# Patient Record
Sex: Female | Born: 1965 | Race: White | Hispanic: No | Marital: Single | Smoking: Current every day smoker
Health system: Southern US, Community
[De-identification: ages and names within clinical notes are randomized; demographics above are authoritative.]

## PROBLEM LIST (undated history)

## (undated) DIAGNOSIS — J449 Chronic obstructive pulmonary disease, unspecified: Secondary | ICD-10-CM

## (undated) DIAGNOSIS — I10 Essential (primary) hypertension: Secondary | ICD-10-CM

## (undated) DIAGNOSIS — I639 Cerebral infarction, unspecified: Secondary | ICD-10-CM

## (undated) DIAGNOSIS — E119 Type 2 diabetes mellitus without complications: Secondary | ICD-10-CM

## (undated) DIAGNOSIS — I2510 Atherosclerotic heart disease of native coronary artery without angina pectoris: Secondary | ICD-10-CM

## (undated) HISTORY — DX: Type 2 diabetes mellitus without complications: E11.9

## (undated) HISTORY — DX: Atherosclerotic heart disease of native coronary artery without angina pectoris: I25.10

## (undated) HISTORY — PX: TUBAL LIGATION: SHX77

---

## 1997-08-09 ENCOUNTER — Encounter: Admission: RE | Admit: 1997-08-09 | Discharge: 1997-11-07 | Payer: Self-pay | Admitting: Family Medicine

## 2000-10-06 ENCOUNTER — Emergency Department (HOSPITAL_COMMUNITY): Admission: EM | Admit: 2000-10-06 | Discharge: 2000-10-06 | Payer: Self-pay | Admitting: *Deleted

## 2001-04-07 ENCOUNTER — Emergency Department (HOSPITAL_COMMUNITY): Admission: EM | Admit: 2001-04-07 | Discharge: 2001-04-07 | Payer: Self-pay | Admitting: Emergency Medicine

## 2002-02-03 ENCOUNTER — Other Ambulatory Visit: Admission: RE | Admit: 2002-02-03 | Discharge: 2002-02-03 | Payer: Self-pay

## 2003-03-02 ENCOUNTER — Other Ambulatory Visit: Admission: RE | Admit: 2003-03-02 | Discharge: 2003-03-02 | Payer: Self-pay

## 2006-04-30 ENCOUNTER — Ambulatory Visit: Payer: Self-pay | Admitting: Family Medicine

## 2006-05-02 ENCOUNTER — Ambulatory Visit: Payer: Self-pay | Admitting: *Deleted

## 2006-05-03 ENCOUNTER — Inpatient Hospital Stay (HOSPITAL_COMMUNITY): Admission: EM | Admit: 2006-05-03 | Discharge: 2006-05-04 | Payer: Self-pay | Admitting: Emergency Medicine

## 2006-05-07 ENCOUNTER — Ambulatory Visit: Payer: Self-pay | Admitting: Family Medicine

## 2006-05-16 ENCOUNTER — Ambulatory Visit: Payer: Self-pay

## 2006-05-20 ENCOUNTER — Ambulatory Visit: Payer: Self-pay | Admitting: Cardiology

## 2006-06-12 ENCOUNTER — Ambulatory Visit: Payer: Self-pay | Admitting: Family Medicine

## 2007-05-15 ENCOUNTER — Ambulatory Visit: Payer: Self-pay | Admitting: Cardiology

## 2009-01-17 ENCOUNTER — Encounter: Payer: Self-pay | Admitting: Cardiology

## 2009-01-18 ENCOUNTER — Telehealth: Payer: Self-pay | Admitting: Cardiology

## 2009-01-18 DIAGNOSIS — I2510 Atherosclerotic heart disease of native coronary artery without angina pectoris: Secondary | ICD-10-CM

## 2009-01-25 ENCOUNTER — Telehealth (INDEPENDENT_AMBULATORY_CARE_PROVIDER_SITE_OTHER): Payer: Self-pay

## 2009-01-26 ENCOUNTER — Ambulatory Visit: Payer: Self-pay

## 2009-01-26 ENCOUNTER — Encounter (HOSPITAL_COMMUNITY): Admission: RE | Admit: 2009-01-26 | Discharge: 2009-02-24 | Payer: Self-pay | Admitting: Cardiology

## 2009-01-26 ENCOUNTER — Ambulatory Visit: Payer: Self-pay | Admitting: Cardiology

## 2010-07-11 NOTE — Assessment & Plan Note (Signed)
[facility]                            CARDIOLOGY OFFICE NOTE   Madison Gentry, Madison Gentry                         MRN:          [id number]  DATE:05/15/2007                            DOB:          1965-09-20    Madison Gentry is seen for cardiology follow-up.  She has known coronary  disease.  I saw her last in March of 2008.  Historically, there is a  history of hypertension.  She is not having any recurrent chest pain.   PAST MEDICAL HISTORY:  ALLERGIES:  NO KNOWN DRUG ALLERGIES.   MEDICATIONS:  Propranolol, hydrochlorothiazide, Lorazepam, Lipitor 40,  amlodipine 5 (dose to be increased to 10), Prozac 20 b.i.d., Ambien 10  and  Goody Powder daily.  We will recheck to be sure that she is getting  aspirin.   OTHER MEDICAL PROBLEMS:  See the list below.   REVIEW OF SYSTEMS:  She is actually feeling well, and her review of  systems is negative.   PHYSICAL EXAM:  VITAL SIGNS:  Weight is 232 pound.  She definitely needs  to lose weight.  Blood pressure is 130/87 with a pulse of 69.  GENERAL:  The patient is oriented to person, time and place.  Affect is  normal.  HEENT:  Reveals no xanthelasma.  She has normal extraocular motion.  There are no carotid bruits.  There is no jugular venous distention.  LUNGS:  Clear.  Respiratory effort is not labored.  CARDIAC:  Exam reveals an S1 with S2.  There are no clicks or  significant murmurs.  ABDOMEN:  The abdomen is soft.  She has no masses or bruits.   PROBLEMS:  Include:  1. History of hypertension.  I am going to push her amlodipine up to      10 mg daily.  2. History of some type of CVA in her 27s.  3. Depression.  4. History of cigarette use that was markedly reduced.  5. Headaches with very small amount of nitrates.  She is tolerating      amlodipine.  6. Coronary disease.  She has significant disease.  Medical therapy is      being followed.  I will see her back in one year.  I will consider      a Myoview  scan at that time.  She definitely needs a fasting lipid      profile.     Luis Abed, MD, [facility]  Electronically Signed    JDK/MedQ  DD: 05/15/2007  DT: 05/15/2007  Job #: [id number]   cc:   Delaney Meigs, M.D.

## 2010-07-14 NOTE — Cardiovascular Report (Signed)
NAME:  Madison Gentry, Madison Gentry NO.:  [id number]   MEDICAL RECORD NO.:  [id number]          PATIENT TYPE:  INP   LOCATION:  4705                         FACILITY:  [facility]   PHYSICIAN:  Arturo Morton. Riley Kill, MD, FACCDATE OF BIRTH:  09/11/1965   DATE OF PROCEDURE:  05/03/2006  DATE OF DISCHARGE:                            CARDIAC CATHETERIZATION   INDICATIONS:  The patient is a 45 year old who presents with 4 days of  intermittent chest discomfort.  Enzymes were negative.  No definite  electrocardiographic abnormalities are identified.  The current study  was done to assess coronary anatomy.   PROCEDURES:  1. Left heart catheterization.  2. Selective coronary arteriography.  3. Selective left ventriculography.   DESCRIPTION OF THE PROCEDURE:  The patient was brought to the  Catheterization Laboratory and prepped and draped in the usual fashion.  Through an anterior puncture, the femoral artery is entered.  Initially,  a 5-French sheath was placed.  Views of the left and right coronary  artery was then obtained in multiple angiographic projections.  It was  noted that the patient had a moderate diagonal stenosis but it was  difficult to lay out.  We therefore upgraded the sheath to a 6-French  sheath, and we used a JL3.0 guiding catheter to intubate the left main.  Multiple views of the left coronary artery were then obtained.  I then  reviewed the films with Dr. Juanda Chance.  The patient was then taken off the  table and into the holding area in satisfactory clinical condition.  She  tolerated the procedure well.  I then reviewed the studies with the  patient's two daughters, and her mother.   HEMODYNAMIC DATA:  1. Central aortic pressure 115/76.  2. Left ventricular pressure 110/14.  3. No gradient or pullback across the aortic valve.   ANGIOGRAPHIC DATA:  1. The right coronary artery is a nondominant vessel.  There is no      high-grade obstructive disease.  2. The left  main is fairly short and free of critical disease.  3. The left anterior descending artery is a fairly large-caliber      vessel that courses to the apex.  In the mid portion of the left      anterior descending artery is an area that clearly demonstrates      systolic compression suggestive of a myocardial bridging.  The      contour of the vessel was as well suggestive of that.  The diagonal      is a moderate-sized branch that demonstrates ostial disease of      about 80%.  The angle of takeoff from the native LAD is fairly      narrow, being less than 60 degrees in its takeoff.  4. The circumflex is a dominant vessel providing a first marginal      branch, there is a second marginal and third and fourth marginals      which are fairly small in caliber.  The distal vessel courses      posteriorly and provides a posterior descending branch derived  from      the circumflex system.  It is free of critical disease.  5. The ventriculogram demonstrates vigorous systolic function.   CONCLUSIONS:  1. Preserved overall left ventricular systolic function.  2. 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  3. High-grade stenosis in a moderately large diagonal branch with a      short discrete lesion at the ostium with angle of incidence of less      than 60 degrees.   DISPOSITION:  The lesion would be potentially approachable with a  cutting balloon.  However, the angle is fairly narrow, and the risk of  compromise of the left anterior descending artery is real with  percutaneous intervention.  The patient has not had a trial of medical  therapy.  Attempts to discontinue smoking, aspirin, and beta blockade as  well as nitrates should be considered.  The patient is not a good  candidate for dual antiplatelet therapy due to heavy menstrual cycles.  Outpatient stress testing would be warranted if we can get the symptoms  to resolve.      Arturo Morton.  Riley Kill, MD, [facility]  Electronically Signed     TDS/MEDQ  D:  05/03/2006  T:  05/04/2006  Job:  [id number]   cc:   Jonelle Sidle, MD  Cecil Cranker, MD, [facility]

## 2010-07-14 NOTE — H&P (Signed)
NAME:  Gentry, Madison NO.:  [id number]   MEDICAL RECORD NO.:  [id number]          PATIENT TYPE:  INP   LOCATION:  1827                         FACILITY:  [facility]   PHYSICIAN:  Rod Holler, MD     DATE OF BIRTH:  [birth year]   DATE OF ADMISSION:  05/02/2006  DATE OF DISCHARGE:                              HISTORY & PHYSICAL   PRIMARY CARE PHYSICIAN:  [facility] Department.   CHIEF COMPLAINT:  Chest pain.   HISTORY OF PRESENT ILLNESS:  Madison Gentry is a 45 year old female with a  history of hypertension, history of a stroke at a young age, tobacco  use, who presented to the emergency department with the complaints of  chest pain.  The patient states that over the past couple of days she  has had intermittent chest discomfort that lasts up to 10 minutes at a  time, has occurred up to four to five times per day.  The pain is not  necessarily associated with activity.  There is some associated  shortness of breath, no associated nausea or diaphoresis.  Prior to a  couple of days ago, she has never had this chest discomfort before.  She  has had no syncope or presyncope, no palpitations, no PND or orthopnea,  no lower extremity swelling.  The last episode occurred a couple of  hours ago.  In the emergency department, the patient was given aspirin.  Currently, she is chest pain free.   PAST MEDICAL HISTORY:  1. Hypertension.  2. History of a stroke in her 1s.   MEDICATIONS:  1. Propranolol/HCTZ 40/25 one tab p.o. daily.  2. Prozac 20 mg p.o. daily.   ALLERGIES:  No known drug allergies.   SOCIAL HISTORY:  The patient smokes two packs per day, works on a farm  and occasionally cleans houses.   FAMILY HISTORY:  Her father died of an MI in his 67s.   REVIEW OF SYSTEMS:  All systems were reviewed in detail and are negative  except as noted in the History of Present Illness.   PHYSICAL EXAMINATION:  VITAL SIGNS:  Blood pressure 145/93, heart rate  74,  temperature 98.5, respiratory rate 18.  GENERAL:  An obese female, alert and oriented x3, in no apparent  distress.  HEENT:  Atraumatic and normocephalic, pupils equal, round, and reactive  to light, extraocular movements intact, oropharynx clear.  NECK:  Supple, no adenopathy, no JVD, no carotid bruits.  CHEST:  Lungs clear to auscultation bilaterally.  CORONARY:  Regular rhythm, normal rate, normal S1 and S2, 2+ peripheral  pulses.  ABDOMEN:  Soft, nontender, and nondistended, active bowel sounds.  EXTREMITIES:  No clubbing, cyanosis, or edema.  NEUROLOGIC:  No focal deficits.   EKG shows a normal sinus rhythm, normal intervals, otherwise normal.   LABORATORY DATA:  White blood cell count 12.9, hematocrit 41.4, platelet  count 415, sodium 136, potassium 3.6, chloride 102, bicarb 28, BUN 9,  creatinine 0.6, glucose 102, total bilirubin is 0.3, alk-phos 63, SGOT  21, SGPT 24, total protein 6.8, albumin 3.6, calcium 9.5,  D-dimer 0.23,  a CK-MB of less than 1, less than 1, troponin less than 0.05, less than  0.05, myoglobin 29, 41.   IMPRESSION:  Chest pain.   PLAN:  1. Admit the patient to telemetry, rule out with serial cardiac      enzymes, aspirin daily, Statin daily, check a lipid panel,      Lopressor b.i.d., ACE inhibitor daily, no anticoagulation at this      time.  Anticipate a noninvasive workup given the present      information.  2. NPO.  3. Chest x-ray if not done in the emergency department.  4. Continue home dose of Prozac.      Rod Holler, MD  Electronically Signed     TRK/MEDQ  D:  05/02/2006  T:  05/03/2006  Job:  [id number]

## 2010-07-14 NOTE — Discharge Summary (Signed)
NAME:  Madison Gentry, [name] NO.:  [id number]   MEDICAL RECORD NO.:  [id number]          PATIENT TYPE:  INP   LOCATION:  4705                         FACILITY:  [facility]   PHYSICIAN:  Doylene Canning. Ladona Ridgel, MD    DATE OF BIRTH:  1965/05/18   DATE OF ADMISSION:  05/02/2006  DATE OF DISCHARGE:  05/04/2006                               DISCHARGE SUMMARY   PRIMARY CARDIOLOGIST:  Cecil Cranker, MD, [facility].   PRIMARY CARE PHYSICIAN:  [facility] Department   PRINCIPAL DIAGNOSIS:  Chest pain and coronary artery disease.   SECONDARY DIAGNOSES:  1. Hypertension.  2. History of cerebrovascular accident in her 59's.  3. Depression.  4. Ongoing tobacco abuse.   ALLERGIES:  No known drug allergies.   PROCEDURE:  Left heart cardiac catheterization.   HISTORY OF PRESENT ILLNESS:  A 45 year old female with no prior history  of CAD who presented to the [facility] ED on May 02, 2006, with  complaints of chest pain and mild shortness of breath.  She was admitted  for further evaluation.   HOSPITAL COURSE:  The patient ruled out for MI.  EKG was without acute  changes.  D-dimer was negative.  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  The right  coronary artery was normal.  EF was normal.  Decision was made to pursue  medical therapy with outpatient stress testing to determine the ischemic  significance of the diagonal disease.  If medical therapy was  unsuccessful, it was felt that short cutting balloon angioplasty could  be performed in the first diagonal; however, the risk of LAD compromise  was felt to be moderate.  She has, therefore, been placed on aspirin,  statin , and low-dose nitrate therapy and has been  advised to stop  smoking.  She has not had any recurrent chest discomfort and will be  discharged home today in satisfactory condition.  We will arrange for an  adenosine Myoview within the week and followup with Dr. Corinda Gubler in  approximately 2 weeks.   DISCHARGE LABORATORY DATA:  Hemoglobin 12.9, hematocrit 36.4, WBC 10.8,  platelets 367.  Sodium 136, potassium 3.6, chloride 106,  CO2 26, BUN  10, creatinine 0.42, glucose 97, total bilirubin 0.3, alkaline  phosphatase 63, AST 21, ALT 24, total protein 6.8, albumin 3.6, calcium  8.4. CK 38, MB 0.5, troponin I 0.02. Total cholesterol 256,  triglycerides 196, HDL 35, LDL 182.  Urinalysis was negative.   DISPOSITION:  Patient is being discharged home today in good condition.   FOLLOWUP PLANS AND APPOINTMENTS:  As above, we will arrange for  outpatient adenosine Myoview in our office in approximately 1 week.  She  will follow with Dr. Glennon Hamilton in approximately 2 weeks.   DISCHARGE MEDICATIONS:  1. Propranolol/hydrochlorothiazide 40/20 mg  daily.  2. Prozac 20 mg daily.  3. Valium 5 mg b.i.d.  4. Aspirin 81  mg daily.  5. Lipitor 40 mg q.p.m.  6. Imdur 30 mg 1/2 tablet daily.  7. Nitroglycerin 0.4 mg sublingual p.r.n. chest pain.   OUTSTANDING LAB STUDIES:  None.   DURATION DISCHARGE ENCOUNTER:  40 minutes including physician time.      Madison Gentry, ANP      Doylene Canning. Ladona Ridgel, MD  Electronically Signed    CB/MEDQ  D:  05/04/2006  T:  05/04/2006  Job:  [id number]   cc:   [facility] Department

## 2010-07-14 NOTE — Assessment & Plan Note (Signed)
[facility] HEALTHCARE                            CARDIOLOGY OFFICE NOTE   Madison Gentry, Madison Gentry                         MRN:          [id number]  DATE:05/20/2006                            DOB:          February 22, 1966    Madison Gentry was admitted to [facility] with chest discomfort.  Decision  was made to proceed with cardiac catheterization.  Study showed that she  had good LV function.  There was 50-60% narrowing of the mid LAD due to  myocardial bridging and there was high grade stenosis of a moderately  large diagonal branch with a short discrete lesion at the ostium with an  angle of incidence of less than 60%.  Dr. Riley Kill felt that the lesion  could be approached with cutting balloon; however, the angle was fairly  narrow and the risk of compromise of the LAD is real.  He felt that a  trial of medical therapy was most appropriate.  The patient has cut her  smoking from 3 packs down to 1/2 pack per day and she is continuing.  She is on aspirin and a beta-blocker.  She gets severe headaches from a  low of nitrate and will have to switch her to amlodipine.  Patient is  not a good candidate for dual antiplatelet therapy as she has very heavy  menstrual cycles.   The patient did have an adenosine Myoview scan.  This was completed on  May 16, 2006.  The ejection fraction is 62%.  There was some breast  attenuation with no marked ischemia.  A very small amount of diagonal  ischemia cannot be ruled out but there was no angina and this was not a  marked finding.  The patient is seen today in the office in followup to  further her care.  She has returned to work.  She is not having any  significant chest pain.  She is here today with her mother for followup.   PAST MEDICAL HISTORY:  ALLERGIES:  NO KNOWN DRUG ALLERGIES.   Medications:  1. Propranolol hydrochlorothiazide.  2. Prozac.  3. Lorazepam.  4. Aspirin 81.  5. Lipitor 40.  6. Imdur (to be held).  7.  Amlodipine (to be added today).  8. She also takes pain meds for chronic headaches.   OTHER MEDICAL PROBLEMS:  See the list below.   REVIEW OF SYSTEMS:  As mentioned, she does have significant headaches.  She also requires medications for anxiety.  Also, she is cutting her  cigarettes back substantially and needs medications to help with this.  Otherwise, her review of systems is negative.  She also had symptoms of  an upper respiratory tract infection.   PHYSICAL EXAM:  Today, her blood pressure is 140/98 with a pulse of 71.  The patient is 229 pounds.  She is oriented to person, time and place.  Affect is normal.  Patient is overweight.  There is no xanthelasma.  She  has normal extraocular motion.  There are no carotid bruits.  There is  no jugular venous distention.  LUNGS:  Clear.  Respiratory effort is not labored.  CARDIAC:  Reveals an S1 with an S2.  There were no clicks or significant  murmurs.  ABDOMEN:  Obese.  There is no masses or bruits.  She has no significant  peripheral edema.  There are no major musculoskeletal deformities.   EKG reveals no significant change.   PROBLEMS:  1. History of hypertension.  The patient needs more blood pressure      medicines.  Amlodipine will be added at 5 mg daily.  2. History of some type of cerebrovascular accident in her 43's.  3. Depression.  4. Cigarette use which has been markedly reduced and she is continuing      to work on this.  5. Headaches with the smallest amount of nitrates and will try      amlodipine instead of Imdur.  6. Upper respiratory infection that is mild and she is recovering on      her own.  7. Coronary disease, as described.  There is an ostial first diagonal      and narrowing of the left anterior descending with bridging.  See      the discussion above.  We will try to treat her with medical      therapy.  The Myoview reveals no significant scan.  I will see her      back for followup.     Luis Abed, MD, [facility]  Electronically Signed    JDK/MedQ  DD: 05/20/2006  DT: 05/20/2006  Job #: [id number]   cc:   Delaney Meigs, M.D.  [facility] Department

## 2011-02-10 ENCOUNTER — Encounter: Payer: Self-pay | Admitting: Emergency Medicine

## 2011-02-10 ENCOUNTER — Inpatient Hospital Stay (HOSPITAL_COMMUNITY)
Admission: EM | Admit: 2011-02-10 | Discharge: 2011-02-11 | DRG: 192 | Disposition: A | Discharge status: Home / Self Care | Payer: Self-pay | Attending: Internal Medicine | Admitting: Internal Medicine

## 2011-02-10 ENCOUNTER — Other Ambulatory Visit: Payer: Self-pay

## 2011-02-10 ENCOUNTER — Emergency Department (HOSPITAL_COMMUNITY): Payer: Self-pay

## 2011-02-10 DIAGNOSIS — I10 Essential (primary) hypertension: Secondary | ICD-10-CM | POA: Diagnosis present

## 2011-02-10 DIAGNOSIS — F3289 Other specified depressive episodes: Secondary | ICD-10-CM | POA: Diagnosis present

## 2011-02-10 DIAGNOSIS — F329 Major depressive disorder, single episode, unspecified: Secondary | ICD-10-CM | POA: Diagnosis present

## 2011-02-10 DIAGNOSIS — F32A Depression, unspecified: Secondary | ICD-10-CM | POA: Diagnosis present

## 2011-02-10 DIAGNOSIS — F17200 Nicotine dependence, unspecified, uncomplicated: Secondary | ICD-10-CM | POA: Diagnosis present

## 2011-02-10 DIAGNOSIS — J441 Chronic obstructive pulmonary disease with (acute) exacerbation: Secondary | ICD-10-CM | POA: Diagnosis present

## 2011-02-10 DIAGNOSIS — I2510 Atherosclerotic heart disease of native coronary artery without angina pectoris: Secondary | ICD-10-CM | POA: Diagnosis present

## 2011-02-10 DIAGNOSIS — J209 Acute bronchitis, unspecified: Principal | ICD-10-CM | POA: Diagnosis present

## 2011-02-10 DIAGNOSIS — R0902 Hypoxemia: Secondary | ICD-10-CM | POA: Diagnosis present

## 2011-02-10 DIAGNOSIS — J440 Chronic obstructive pulmonary disease with acute lower respiratory infection: Principal | ICD-10-CM | POA: Diagnosis present

## 2011-02-10 DIAGNOSIS — E669 Obesity, unspecified: Secondary | ICD-10-CM | POA: Diagnosis present

## 2011-02-10 HISTORY — DX: Cerebral infarction, unspecified: I63.9

## 2011-02-10 HISTORY — DX: Essential (primary) hypertension: I10

## 2011-02-10 HISTORY — DX: Chronic obstructive pulmonary disease, unspecified: J44.9

## 2011-02-10 LAB — D-DIMER, QUANTITATIVE: D-Dimer, Quant: 0.42 ug/mL-FEU (ref 0.00–0.48)

## 2011-02-10 LAB — CBC
HCT: 42.8 % (ref 36.0–46.0)
MCHC: 33.9 g/dL (ref 30.0–36.0)
Platelets: 365 10*3/uL (ref 150–400)
RDW: 13.4 % (ref 11.5–15.5)

## 2011-02-10 LAB — URINALYSIS, ROUTINE W REFLEX MICROSCOPIC
Bilirubin Urine: NEGATIVE
Nitrite: NEGATIVE
Specific Gravity, Urine: 1.02 (ref 1.005–1.030)
pH: 6 (ref 5.0–8.0)

## 2011-02-10 LAB — POCT I-STAT, CHEM 8
BUN: 6 mg/dL (ref 6–23)
Calcium, Ion: 1.1 mmol/L — ABNORMAL LOW (ref 1.12–1.32)
HCT: 46 % (ref 36.0–46.0)
Sodium: 140 mEq/L (ref 135–145)
TCO2: 30 mmol/L (ref 0–100)

## 2011-02-10 LAB — POCT I-STAT TROPONIN I: Troponin i, poc: 0 ng/mL (ref 0.00–0.08)

## 2011-02-10 LAB — CREATININE, SERUM: GFR calc non Af Amer: >90 mL/min (ref >90)

## 2011-02-10 LAB — URINE MICROSCOPIC-ADD ON

## 2011-02-10 LAB — PREGNANCY, URINE: Preg Test, Ur: NEGATIVE

## 2011-02-10 MED ORDER — ALBUTEROL SULFATE HFA 108 (90 BASE) MCG/ACT IN AERS
2.0000 | INHALATION_SPRAY | RESPIRATORY_TRACT | Status: AC
  Administered 2011-02-10: 2 via RESPIRATORY_TRACT
  Filled 2011-02-10: qty 6.7

## 2011-02-10 MED ORDER — NICOTINE 21 MG/24HR TD PT24
21.0000 mg | MEDICATED_PATCH | Freq: Every day | TRANSDERMAL | Status: DC
  Administered 2011-02-10 – 2011-02-11 (×2): 21 mg via TRANSDERMAL
  Filled 2011-02-10 (×3): qty 1

## 2011-02-10 MED ORDER — AZITHROMYCIN 250 MG PO TABS
500.0000 mg | ORAL_TABLET | Freq: Every day | ORAL | Status: AC
  Administered 2011-02-10: 500 mg via ORAL
  Filled 2011-02-10: qty 2

## 2011-02-10 MED ORDER — ENOXAPARIN SODIUM 40 MG/0.4ML ~~LOC~~ SOLN
40.0000 mg | SUBCUTANEOUS | Status: DC
  Administered 2011-02-10: 40 mg via SUBCUTANEOUS
  Filled 2011-02-10: qty 0.4

## 2011-02-10 MED ORDER — ACETAMINOPHEN 325 MG PO TABS: 650.0000 mg | ORAL_TABLET | Freq: Four times a day (QID) | ORAL | Status: DC | PRN

## 2011-02-10 MED ORDER — SENNA 8.6 MG PO TABS
1.0000 | ORAL_TABLET | Freq: Two times a day (BID) | ORAL | Status: DC
  Administered 2011-02-10 – 2011-02-11 (×2): 8.6 mg via ORAL
  Filled 2011-02-10 (×2): qty 1

## 2011-02-10 MED ORDER — IBUPROFEN 400 MG PO TABS
400.0000 mg | ORAL_TABLET | Freq: Once | ORAL | Status: AC
  Administered 2011-02-10: 400 mg via ORAL
  Filled 2011-02-10: qty 1

## 2011-02-10 MED ORDER — ALBUTEROL SULFATE (5 MG/ML) 0.5% IN NEBU: 2.5000 mg | INHALATION_SOLUTION | RESPIRATORY_TRACT | Status: DC | PRN

## 2011-02-10 MED ORDER — ALBUTEROL SULFATE (5 MG/ML) 0.5% IN NEBU
10.0000 mg | INHALATION_SOLUTION | Freq: Once | RESPIRATORY_TRACT | Status: AC
  Administered 2011-02-10: 10 mg via RESPIRATORY_TRACT
  Filled 2011-02-10: qty 2

## 2011-02-10 MED ORDER — SODIUM CHLORIDE 0.9 % IN NEBU
INHALATION_SOLUTION | RESPIRATORY_TRACT | Status: AC
  Administered 2011-02-10: 12 mL
  Filled 2011-02-10: qty 12

## 2011-02-10 MED ORDER — POTASSIUM CHLORIDE IN NACL 40-0.9 MEQ/L-% IV SOLN
INTRAVENOUS | Status: AC
  Filled 2011-02-10: qty 1000

## 2011-02-10 MED ORDER — ASPIRIN EC 81 MG PO TBEC
81.0000 mg | DELAYED_RELEASE_TABLET | Freq: Every day | ORAL | Status: DC
  Administered 2011-02-10 – 2011-02-11 (×2): 81 mg via ORAL
  Filled 2011-02-10 (×2): qty 1

## 2011-02-10 MED ORDER — IPRATROPIUM BROMIDE 0.02 % IN SOLN
1.0000 mg | Freq: Once | RESPIRATORY_TRACT | Status: AC
  Administered 2011-02-10: 1 mg via RESPIRATORY_TRACT
  Filled 2011-02-10: qty 5

## 2011-02-10 MED ORDER — POTASSIUM CHLORIDE IN NACL 40-0.9 MEQ/L-% IV SOLN
INTRAVENOUS | Status: DC
  Administered 2011-02-10 – 2011-02-11 (×2): via INTRAVENOUS
  Filled 2011-02-10 (×5): qty 1000

## 2011-02-10 MED ORDER — IPRATROPIUM BROMIDE 0.02 % IN SOLN
0.5000 mg | Freq: Once | RESPIRATORY_TRACT | Status: AC
Start: 2011-02-10 — End: 2011-02-10
  Administered 2011-02-10: 0.5 mg via RESPIRATORY_TRACT
  Filled 2011-02-10: qty 2.5

## 2011-02-10 MED ORDER — IPRATROPIUM BROMIDE 0.02 % IN SOLN
0.5000 mg | Freq: Four times a day (QID) | RESPIRATORY_TRACT | Status: DC
  Administered 2011-02-11 (×2): 0.5 mg via RESPIRATORY_TRACT
  Filled 2011-02-10 (×3): qty 2.5

## 2011-02-10 MED ORDER — PROMETHAZINE HCL 25 MG/ML IJ SOLN: 12.5000 mg | Freq: Four times a day (QID) | INTRAMUSCULAR | Status: DC | PRN

## 2011-02-10 MED ORDER — PROMETHAZINE HCL 12.5 MG PO TABS: 12.5000 mg | ORAL_TABLET | Freq: Four times a day (QID) | ORAL | Status: DC | PRN

## 2011-02-10 MED ORDER — POTASSIUM CHLORIDE CRYS ER 20 MEQ PO TBCR
40.0000 meq | EXTENDED_RELEASE_TABLET | Freq: Once | ORAL | Status: AC
  Administered 2011-02-10: 40 meq via ORAL
  Filled 2011-02-10: qty 2

## 2011-02-10 MED ORDER — MORPHINE SULFATE 2 MG/ML IJ SOLN
2.0000 mg | INTRAMUSCULAR | Status: DC | PRN
  Administered 2011-02-10 – 2011-02-11 (×2): 2 mg via INTRAVENOUS
  Filled 2011-02-10 (×2): qty 1

## 2011-02-10 MED ORDER — ACETAMINOPHEN 650 MG RE SUPP: 650.0000 mg | Freq: Four times a day (QID) | RECTAL | Status: DC | PRN

## 2011-02-10 MED ORDER — DM-GUAIFENESIN ER 30-600 MG PO TB12
1.0000 | ORAL_TABLET | Freq: Two times a day (BID) | ORAL | Status: DC | PRN
  Filled 2011-02-10: qty 1

## 2011-02-10 MED ORDER — POTASSIUM CHLORIDE 20 MEQ PO PACK: 40.0000 meq | PACK | Freq: Once | ORAL | Status: DC

## 2011-02-10 MED ORDER — ACETAMINOPHEN 500 MG PO TABS
1000.0000 mg | ORAL_TABLET | Freq: Once | ORAL | Status: AC
  Administered 2011-02-10: 1000 mg via ORAL
  Filled 2011-02-10: qty 2

## 2011-02-10 MED ORDER — PREDNISONE 20 MG PO TABS
40.0000 mg | ORAL_TABLET | Freq: Two times a day (BID) | ORAL | Status: DC
  Administered 2011-02-10 – 2011-02-11 (×2): 40 mg via ORAL
  Filled 2011-02-10 (×2): qty 2

## 2011-02-10 MED ORDER — PREDNISONE 20 MG PO TABS
60.0000 mg | ORAL_TABLET | Freq: Once | ORAL | Status: AC
  Administered 2011-02-10: 60 mg via ORAL
  Filled 2011-02-10: qty 3

## 2011-02-10 MED ORDER — AZITHROMYCIN 250 MG PO TABS
250.0000 mg | ORAL_TABLET | Freq: Every day | ORAL | Status: DC
  Administered 2011-02-11: 250 mg via ORAL
  Filled 2011-02-10: qty 1

## 2011-02-10 MED ORDER — ALUM & MAG HYDROXIDE-SIMETH 200-200-20 MG/5ML PO SUSP: 30.0000 mL | Freq: Four times a day (QID) | ORAL | Status: DC | PRN

## 2011-02-10 MED ORDER — ALBUTEROL SULFATE (5 MG/ML) 0.5% IN NEBU
2.5000 mg | INHALATION_SOLUTION | RESPIRATORY_TRACT | Status: DC
  Administered 2011-02-11 (×3): 2.5 mg via RESPIRATORY_TRACT
  Filled 2011-02-10 (×3): qty 0.5

## 2011-02-10 MED ORDER — ALBUTEROL SULFATE (5 MG/ML) 0.5% IN NEBU
5.0000 mg | INHALATION_SOLUTION | Freq: Once | RESPIRATORY_TRACT | Status: AC
  Administered 2011-02-10: 5 mg via RESPIRATORY_TRACT
  Filled 2011-02-10: qty 1

## 2011-02-10 NOTE — ED Notes (Signed)
Patient's oxygen sat 87-88% on room air. Oxygen reapplied. Resp in room to give albuterol inhaler. EDP aware order given for hour long neb treatment. No acute distress noted.

## 2011-02-10 NOTE — ED Notes (Signed)
Pt c/o cough, headache, sob, bodyaches, fever and chills.

## 2011-02-10 NOTE — ED Provider Notes (Signed)
History     CSN: [id number] Arrival date & time: 02/10/2011 11:33 AM   Chief Complaint  Patient presents with  . Cough  . Shortness of Breath  . Fever  . Chills   HPI Pt was seen at 1350.  Per pt, c/o gradual onset and persistence of constant SOB, cough, generalized body aches/fatigue, thoracic back pain from coughing, subjective fevers/chills, runny/stuffy nose, sinus and ears congestion for the past several days.  Denies abd pain, no N/V/D, no rash.   Past Medical History  Diagnosis Date  . Stroke   . COPD (chronic obstructive pulmonary disease)   . Hypertension   . Heart disease     Past Surgical History  Procedure Date  . Tubal ligation     History  Substance Use Topics  . Smoking status: Current Everyday Smoker    Types: Cigarettes  . Smokeless tobacco: Not on file  . Alcohol Use: Yes     occas   Review of Systems ROS: Statement: All systems negative except as marked or noted in the HPI; Constitutional: +chills, generalized body aches/fatigue. ; ; Eyes: Negative for eye pain, redness and discharge. ; ; ENMT: Negative for ear pain, hoarseness, +rhinorrhea, nasal congestion, sinus pressure and sore throat. ; ; Cardiovascular: Negative for chest pain, palpitations, diaphoresis, dyspnea and peripheral edema. ; ; Respiratory: +cough, SOB.  Negative for wheezing and stridor. ; ; Gastrointestinal: Negative for nausea, vomiting, diarrhea, abdominal pain, blood in stool, hematemesis, jaundice and rectal bleeding. . ; ; Genitourinary: Negative for dysuria, flank pain and hematuria. ; ; Musculoskeletal: +back pain.  Negative for neck pain. Negative for swelling and trauma.; ; Skin: Negative for pruritus, rash, abrasions, blisters, bruising and skin lesion.; ; Neuro: Negative for headache, lightheadedness and neck stiffness. Negative for weakness, altered level of consciousness , altered mental status, extremity weakness, paresthesias, involuntary movement, seizure and syncope.      Allergies  Review of patient's allergies indicates no known allergies.  Home Medications  No current outpatient prescriptions on file.  BP 123/62  Pulse 79  Temp(Src) 98.5 F (36.9 C) (Oral)  Resp 20  Ht 5\' 2"  (1.575 m)  Wt 218 lb (98.884 kg)  BMI 39.87 kg/m2  SpO2 92%  LMP 02/08/2011  Physical Exam 1355: Physical examination:  Nursing notes reviewed; Vital signs and O2 SAT reviewed;  Constitutional: Well developed, Well nourished, Well hydrated, In no acute distress; Head:  Normocephalic, atraumatic; Eyes: EOMI, PERRL, No scleral icterus; ENMT: TM's clear bilat.  +edemetous nasal turbinates bilat with clear rhinorrhea. Mouth and pharynx normal, Mucous membranes moist; Neck: Supple, Full range of motion, No lymphadenopathy; Cardiovascular: Regular rate and rhythm, No murmur, rub, or gallop; Respiratory: Breath sounds coarse & equal bilaterally, No wheezes, Normal respiratory effort/excursion; Chest: Nontender, Movement normal; Abdomen: Soft, Nontender, Nondistended, Normal bowel sounds; Genitourinary: No CVA tenderness; Extremities: Pulses normal, No tenderness, No edema, No calf edema or asymmetry.; Neuro: AA&Ox3, Major CN grossly intact.  No gross focal motor or sensory deficits in extremities.; Skin: Color normal, Warm, Dry, no rash.    ED Course  Procedures   1430:  Pt completed neb.  Sats on R/A while sitting on stretcher dropped to 92%, pt then ambulated around ED with O2 Sat further dropping to 86-88%.  Pt placed back on stretcher on O2 N/C with improvement in Sats to 96%.  Has hx of COPD and continues to smoke.  Lungs coarse, diminished, no wheezes.  Will dose continuous neb, prednisone, check labs, EKG.  MDM  MDM Reviewed: nursing note, vitals and previous chart Interpretation: x-ray, labs and ECG   CRITICAL CARE Performed by: Laray Anger Total critical care time: 35 Critical care time was exclusive of separately billable procedures and treating other  patients. Critical care was necessary to treat or prevent imminent or life-threatening deterioration. Critical care was time spent personally by me on the following activities: development of treatment plan with patient and/or surrogate as well as nursing, discussions with consultants, evaluation of patient's response to treatment, examination of patient, obtaining history from patient or surrogate, ordering and performing treatments and interventions, ordering and review of laboratory studies, ordering and review of radiographic studies, pulse oximetry and re-evaluation of patient's condition.   Date: 02/10/2011  Rate: 78  Rhythm: normal sinus rhythm  QRS Axis: normal  Intervals: normal  ST/T Wave abnormalities: normal  Conduction Disutrbances:none  Narrative Interpretation:   Old EKG Reviewed: none available    Results for orders placed during the hospital encounter of 02/10/11  D-DIMER, QUANTITATIVE      Component Value Range   D-Dimer, Quant 0.42  0.00 - 0.48 (ug/mL-FEU)  POCT I-STAT TROPONIN I      Component Value Range   Troponin i, poc 0.00  0.00 - 0.08 (ng/mL)   Comment 3           POCT I-STAT, CHEM 8      Component Value Range   Sodium 140  135 - 145 (mEq/L)   Potassium 3.0 (*) 3.5 - 5.1 (mEq/L)   Chloride 99  96 - 112 (mEq/L)   BUN 6  6 - 23 (mg/dL)   Creatinine, Ser 1.61  0.50 - 1.10 (mg/dL)   Glucose, Bld 096 (*) 70 - 99 (mg/dL)   Calcium, Ion 0.45 (*) 1.12 - 1.32 (mmol/L)   TCO2 30  0 - 100 (mmol/L)   Hemoglobin 15.6 (*) 12.0 - 15.0 (g/dL)   HCT 40.9  81.1 - 91.4 (%)   Dg Chest 2 View  02/10/2011  *RADIOLOGY REPORT*  Clinical Data: Rule out infiltrate.  Cough, COPD, hypertension.  CHEST - 2 VIEW  Comparison: 05/02/2006  Findings: Heart size is normal.  There is perihilar peribronchial thickening.  There are no focal consolidations or pleural effusions.  No evidence for pulmonary edema. Visualized osseous structures have a normal appearance.  IMPRESSION: Bronchitic  changes.  Original Report Authenticated By: Patterson Hammersmith, M.D.      5:20 PM:  Continuous neb and prednisone given.  Lungs with scattered insp/exp wheezes, no audible wheezing.  Pt ambulated again, Sats remain approx 88-89% R/A, c/o SOB.  Sats return to 96% R/A with O2 2L N/C.  Potassium repleted PO.  Will admit.  Dx testing d/w pt and family.  Questions answered.  Verb understanding, agreeable to admit.  5:44 PM:  T/C to [facility]. Lendell Caprice, case discussed, including:  HPI, pertinent PM/SHx, VS/PE, dx testing, ED course and treatment.  Agreeable to admit.  Requests to write temporary orders, medical bed.   Brayn Eckstein Allison Quarry, DO 02/11/11 1929

## 2011-02-10 NOTE — ED Notes (Signed)
Patient's O2 sat 89% after breathing treatment. Patient moved to room 4. Cardiac monitored applied. O2 2 liters via [place] applied. O2 sats 95-96%. No acute distress noted.

## 2011-02-11 DIAGNOSIS — E669 Obesity, unspecified: Secondary | ICD-10-CM | POA: Diagnosis present

## 2011-02-11 DIAGNOSIS — I10 Essential (primary) hypertension: Secondary | ICD-10-CM | POA: Diagnosis present

## 2011-02-11 DIAGNOSIS — R0902 Hypoxemia: Secondary | ICD-10-CM | POA: Diagnosis present

## 2011-02-11 DIAGNOSIS — J441 Chronic obstructive pulmonary disease with (acute) exacerbation: Secondary | ICD-10-CM | POA: Diagnosis present

## 2011-02-11 DIAGNOSIS — F32A Depression, unspecified: Secondary | ICD-10-CM | POA: Diagnosis present

## 2011-02-11 DIAGNOSIS — J209 Acute bronchitis, unspecified: Secondary | ICD-10-CM | POA: Diagnosis present

## 2011-02-11 DIAGNOSIS — F329 Major depressive disorder, single episode, unspecified: Secondary | ICD-10-CM | POA: Diagnosis present

## 2011-02-11 LAB — CBC
HCT: 39 % (ref 36.0–46.0)
Hemoglobin: 13.1 g/dL (ref 12.0–15.0)
MCH: 28.7 pg (ref 26.0–34.0)
MCHC: 33.6 g/dL (ref 30.0–36.0)
MCV: 85.5 fL (ref 78.0–100.0)
RDW: 13.2 % (ref 11.5–15.5)

## 2011-02-11 LAB — BASIC METABOLIC PANEL
BUN: 9 mg/dL (ref 6–23)
Calcium: 8.8 mg/dL (ref 8.4–10.5)
Chloride: 102 mEq/L (ref 96–112)
Creatinine, Ser: 0.42 mg/dL — ABNORMAL LOW (ref 0.50–1.10)
GFR calc Af Amer: >90 mL/min (ref >90)
GFR calc non Af Amer: >90 mL/min (ref >90)

## 2011-02-11 LAB — DIFFERENTIAL
Basophils Relative: 0 % (ref 0–1)
Eosinophils Absolute: 0 10*3/uL (ref 0.0–0.7)
Eosinophils Relative: 0 % (ref 0–5)
Monocytes Absolute: 0.4 10*3/uL (ref 0.1–1.0)
Monocytes Relative: 7 % (ref 3–12)

## 2011-02-11 MED ORDER — POTASSIUM CHLORIDE IN NACL 40-0.9 MEQ/L-% IV SOLN
INTRAVENOUS | Status: AC
  Filled 2011-02-11: qty 1000

## 2011-02-11 MED ORDER — AMLODIPINE BESYLATE 5 MG PO TABS
10.0000 mg | ORAL_TABLET | Freq: Every day | ORAL | Status: DC
  Administered 2011-02-11: 10 mg via ORAL
  Filled 2011-02-11: qty 2

## 2011-02-11 MED ORDER — AZITHROMYCIN 250 MG PO TABS: 250.0000 mg | ORAL_TABLET | Freq: Every day | ORAL | Status: DC

## 2011-02-11 MED ORDER — PROPRANOLOL HCL 20 MG PO TABS
40.0000 mg | ORAL_TABLET | ORAL | Status: DC
  Administered 2011-02-11: 40 mg via ORAL
  Filled 2011-02-11: qty 2

## 2011-02-11 MED ORDER — GUAIFENESIN ER 600 MG PO TB12
1200.0000 mg | ORAL_TABLET | Freq: Two times a day (BID) | ORAL | Status: DC
  Administered 2011-02-11 (×2): 1200 mg via ORAL
  Filled 2011-02-11 (×2): qty 2

## 2011-02-11 MED ORDER — FLUOXETINE HCL 20 MG PO CAPS
20.0000 mg | ORAL_CAPSULE | Freq: Three times a day (TID) | ORAL | Status: DC
  Administered 2011-02-11: 20 mg via ORAL
  Filled 2011-02-11: qty 1

## 2011-02-11 MED ORDER — ALBUTEROL 90 MCG/ACT IN AERS: 2.0000 | INHALATION_SPRAY | RESPIRATORY_TRACT | Status: DC | PRN

## 2011-02-11 MED ORDER — SIMVASTATIN 20 MG PO TABS: 20.0000 mg | ORAL_TABLET | Freq: Every day | ORAL | Status: DC

## 2011-02-11 MED ORDER — PREDNISONE (PAK) 10 MG PO TABS: 10.0000 mg | ORAL_TABLET | Freq: Every day | ORAL | Status: AC

## 2011-02-11 MED ORDER — BENZONATATE 100 MG PO CAPS
100.0000 mg | ORAL_CAPSULE | Freq: Three times a day (TID) | ORAL | Status: DC | PRN
  Administered 2011-02-11: 200 mg via ORAL
  Filled 2011-02-11: qty 2

## 2011-02-11 NOTE — Discharge Summary (Signed)
Physician Discharge Summary  Patient ID: Madison Gentry MRN: [id number] DOB/AGE: Feb 05, 1966 45 y.o.  Admit date: 02/10/2011 Discharge date: 02/11/2011  Discharge Diagnoses:  Active Problems:  Acute bronchitis  COPD exacerbation  Hypoxia  Benign hypertension  CAD  Obesity  Depression   Current Discharge Medication List    START taking these medications   Details  albuterol (PROVENTIL,VENTOLIN) 90 MCG/ACT inhaler Inhale 2 puffs into the lungs every 4 (four) hours as needed for wheezing. Qty: 1 each, Refills: 0    azithromycin (ZITHROMAX) 250 MG tablet Take 1 tablet (250 mg total) by mouth daily. Qty: 4 each, Refills: 0    predniSONE (STERAPRED UNI-PAK) 10 MG tablet Take 1 tablet (10 mg total) by mouth daily. 4 tablets daily then decrease by 1 tablet every 2 days until off Qty: 20 tablet, Refills: 0      CONTINUE these medications which have NOT CHANGED   Details  amLODipine (NORVASC) 10 MG tablet Take 10 mg by mouth daily.     Aspirin-Acetaminophen-Caffeine (GOODYS EXTRA STRENGTH) 520-260-32.5 MG PACK Take 1 packet by mouth 2 (two) times daily as needed. For pain or headache     doxylamine, Sleep, (UNISOM) 25 MG tablet Take 50 mg by mouth at bedtime as needed. For sleep     FLUoxetine (PROZAC) 20 MG capsule Take 20 mg by mouth 3 (three) times daily.     hydrochlorothiazide (HYDRODIURIL) 25 MG tablet Take 25 mg by mouth daily.     HYDROcodone-acetaminophen (VICODIN) 5-500 MG per tablet Take 1 tablet by mouth 3 (three) times daily as needed. For pain     pravastatin (PRAVACHOL) 40 MG tablet Take 40 mg by mouth daily.     propranolol (INDERAL) 40 MG tablet Take 40 mg by mouth every morning.          Discharge Orders    Future Orders Please Complete By Expires   Diet - low sodium heart healthy      Increase activity slowly      Discharge instructions      Comments:   Quit smoking      Follow-up Information    Follow up with [facility]. (If  symptoms worsen)    Contact information:   [address] [place] [address] [phone]          Disposition:   Discharged Condition:   Consults:    Labs:   Results for orders placed during the hospital encounter of 02/10/11 (from the past 48 hour(s))  CBC     Status: Normal   Collection Time   02/10/11  2:25 PM      Component Value Range Comment   WBC 6.8  4.0 - 10.5 (K/uL)    RBC 4.94  3.87 - 5.11 (MIL/uL)    Hemoglobin 14.5  12.0 - 15.0 (g/dL)    HCT 34.7  42.5 - 95.6 (%)    MCV 86.6  78.0 - 100.0 (fL)    MCH 29.4  26.0 - 34.0 (pg)    MCHC 33.9  30.0 - 36.0 (g/dL)    RDW 38.7  56.4 - 33.2 (%)    Platelets 365  150 - 400 (K/uL)   CREATININE, SERUM     Status: Normal   Collection Time   02/10/11  2:25 PM      Component Value Range Comment   Creatinine, Ser 0.55  0.50 - 1.10 (mg/dL)    GFR calc non Af Amer >90  >90 (mL/min)  GFR calc Af Amer >90  >90 (mL/min)   D-DIMER, QUANTITATIVE     Status: Normal   Collection Time   02/10/11  2:52 PM      Component Value Range Comment   D-Dimer, Quant 0.42  0.00 - 0.48 (ug/mL-FEU)   MAGNESIUM     Status: Normal   Collection Time   02/10/11  2:52 PM      Component Value Range Comment   Magnesium 2.0  1.5 - 2.5 (mg/dL)   POCT I-STAT TROPONIN I     Status: Normal   Collection Time   02/10/11  2:58 PM      Component Value Range Comment   Troponin i, poc 0.00  0.00 - 0.08 (ng/mL)    Comment 3            POCT I-STAT, CHEM 8     Status: Abnormal   Collection Time   02/10/11  3:15 PM      Component Value Range Comment   Sodium 140  135 - 145 (mEq/L)    Potassium 3.0 (*) 3.5 - 5.1 (mEq/L)    Chloride 99  96 - 112 (mEq/L)    BUN 6  6 - 23 (mg/dL)    Creatinine, Ser 1.61  0.50 - 1.10 (mg/dL)    Glucose, Bld 096 (*) 70 - 99 (mg/dL)    Calcium, Ion 0.45 (*) 1.12 - 1.32 (mmol/L)    TCO2 30  0 - 100 (mmol/L)    Hemoglobin 15.6 (*) 12.0 - 15.0 (g/dL)    HCT 40.9  81.1 - 91.4 (%)   URINALYSIS, ROUTINE W REFLEX  MICROSCOPIC     Status: Abnormal   Collection Time   02/10/11  5:16 PM      Component Value Range Comment   Color, Urine YELLOW  YELLOW     APPearance HAZY (*) CLEAR     Specific Gravity, Urine 1.020  1.005 - 1.030     pH 6.0  5.0 - 8.0     Glucose, UA NEGATIVE  NEGATIVE (mg/dL)    Hgb urine dipstick LARGE (*) NEGATIVE     Bilirubin Urine NEGATIVE  NEGATIVE     Ketones, ur NEGATIVE  NEGATIVE (mg/dL)    Protein, ur NEGATIVE  NEGATIVE (mg/dL)    Urobilinogen, UA 0.2  0.0 - 1.0 (mg/dL)    Nitrite NEGATIVE  NEGATIVE     Leukocytes, UA TRACE (*) NEGATIVE    PREGNANCY, URINE     Status: Normal   Collection Time   02/10/11  5:16 PM      Component Value Range Comment   Preg Test, Ur NEGATIVE     URINE MICROSCOPIC-ADD ON     Status: Abnormal   Collection Time   02/10/11  5:16 PM      Component Value Range Comment   WBC, UA 7-10  <3 (WBC/hpf)    RBC / HPF TOO NUMEROUS TO COUNT  <3 (RBC/hpf)    Bacteria, UA FEW (*) RARE    CBC     Status: Normal   Collection Time   02/11/11  6:48 AM      Component Value Range Comment   WBC 6.0  4.0 - 10.5 (K/uL)    RBC 4.56  3.87 - 5.11 (MIL/uL)    Hemoglobin 13.1  12.0 - 15.0 (g/dL)    HCT 78.2  95.6 - 21.3 (%)    MCV 85.5  78.0 - 100.0 (fL)    MCH 28.7  26.0 -  34.0 (pg)    MCHC 33.6  30.0 - 36.0 (g/dL)    RDW 81.1  91.4 - 78.2 (%)    Platelets 354  150 - 400 (K/uL)   DIFFERENTIAL     Status: Normal   Collection Time   02/11/11  6:48 AM      Component Value Range Comment   Neutrophils Relative 69  43 - 77 (%)    Neutro Abs 4.1  1.7 - 7.7 (K/uL)    Lymphocytes Relative 24  12 - 46 (%)    Lymphs Abs 1.4  0.7 - 4.0 (K/uL)    Monocytes Relative 7  3 - 12 (%)    Monocytes Absolute 0.4  0.1 - 1.0 (K/uL)    Eosinophils Relative 0  0 - 5 (%)    Eosinophils Absolute 0.0  0.0 - 0.7 (K/uL)    Basophils Relative 0  0 - 1 (%)    Basophils Absolute 0.0  0.0 - 0.1 (K/uL)   BASIC METABOLIC PANEL     Status: Abnormal   Collection Time   02/11/11  6:48  AM      Component Value Range Comment   Sodium 137  135 - 145 (mEq/L)    Potassium 3.6  3.5 - 5.1 (mEq/L)    Chloride 102  96 - 112 (mEq/L)    CO2 28  19 - 32 (mEq/L)    Glucose, Bld 208 (*) 70 - 99 (mg/dL)    BUN 9  6 - 23 (mg/dL)    Creatinine, Ser 9.56 (*) 0.50 - 1.10 (mg/dL) DELTA CHECK NOTED   Calcium 8.8  8.4 - 10.5 (mg/dL)    GFR calc non Af Amer >90  >90 (mL/min)    GFR calc Af Amer >90  >90 (mL/min)     Diagnostics:  Dg Chest 2 View  02/10/2011  *RADIOLOGY REPORT*  Clinical Data: Rule out infiltrate.  Cough, COPD, hypertension.  CHEST - 2 VIEW  Comparison: 05/02/2006  Findings: Heart size is normal.  There is perihilar peribronchial thickening.  There are no focal consolidations or pleural effusions.  No evidence for pulmonary edema. Visualized osseous structures have a normal appearance.  IMPRESSION: Bronchitic changes.  Original Report Authenticated By: Patterson Hammersmith, M.D.   Full Code   Hospital Course:  The patient is a pleasant 45 year old white female smoker with history of COPD who presents with upper respiratory symptoms. She had cough productive of green sputum. Fevers chills wheezing shortness of breath. Her chest x-ray showed no pneumonia. She was afebrile and had unremarkable vital signs. However, she remained hypoxic in the emergency room after steroids bronchodilators and oxygen therapy. She was wheezing on exam. She continues to smoke a pack and a half of cigarettes a day. She wishes to quit. She was monitored overnight. She is feeling better today. She still has some fleas but is comfortable. I've asked the nurses to ambulate with her in the hallways and if stable she can be discharged home. I started her on azithromycin and she can continue steroids. She is uninsured and I've asked the nursing staff to assist with the medications. Her other problems remained stable during hospitalization.   Discharge Exam:  Blood pressure 133/83, pulse 95, temperature 97.9 F  (36.6 C), temperature source Oral, resp. rate 16, height 5\' 2"  (1.575 m), weight 98.884 kg (218 lb), last menstrual period 02/08/2011, SpO2 92.00%.  General: Comfortable. Lungs: Slight wheeze bilaterally. Good air movement. Breathing nonlabored. Cardiovascular regular rate rhythm without murmurs  gallops rubs   Signed: Petar Mucci L 02/11/2011, 10:32 AM

## 2011-02-11 NOTE — Progress Notes (Signed)
Client ambulated 28ft with assistance. Client showed no complications with ambulation.

## 2011-02-11 NOTE — Progress Notes (Signed)
Client discharged to home in stable condition. Reviewed discharge instructions, smoking cessation, and medications with client prior to discharge. Client voiced understanding.

## 2011-02-11 NOTE — H&P (Addendum)
Hospital Admission Note Date: 02/11/2011  Patient name: Madison Gentry Medical record number: [id number] Date of birth: March 31, 1965 Age: 45 y.o. Gender: female PCP: No primary provider on file.  Attending physician: Christiane Ha  Chief Complaint: Cough and shortness of breath  History of Present Illness:  Madison Gentry is an 45 y.o. female who presents with a several day history of worsening cough and shortness of breath. Her cough has been productive of green sputum. She has had subjective fevers and chills. She's been wheezing. She continues to smoke a pack and a half of cigarettes a day. She has had several sick contacts. She has had myalgias. Sore throat. Nausea vomiting. Poor appetite. She receives steroids, bronchodilators, oxygen therapy. She remained hypoxic off oxygen while ambulating and so the hospitalists were called for admission. The patient's also complaining of pleuritic chest pain.  Past Medical History  Diagnosis Date  . Stroke   . COPD (chronic obstructive pulmonary disease)   . Hypertension   . Heart disease     Meds: Prescriptions prior to admission  Medication Sig Dispense Refill  . amLODipine (NORVASC) 10 MG tablet Take 10 mg by mouth daily.        Marland Kitchen FLUoxetine (PROZAC) 20 MG capsule Take 20 mg by mouth 3 (three) times daily.        . hydrochlorothiazide (HYDRODIURIL) 25 MG tablet Take 25 mg by mouth daily.        . pravastatin (PRAVACHOL) 40 MG tablet Take 40 mg by mouth daily.        . propranolol (INDERAL) 40 MG tablet Take 40 mg by mouth 1 day or 1 dose.        Marland Kitchen HYDROcodone-acetaminophen (VICODIN) 5-500 MG per tablet Take 1 tablet by mouth 2 (two) times daily as needed.          Allergies: Review of patient's allergies indicates no known allergies. History   Social History  . Marital Status: Single    Spouse Name: N/A    Number of Children: N/A  . Years of Education: N/A   Occupational History  . Not on file.   Social History Main Topics  .  Smoking status: Current Everyday Smoker    Types: Cigarettes  . Smokeless tobacco: Not on file  . Alcohol Use: Yes     occas  . Drug Use: No  . Sexually Active:    Other Topics Concern  . Not on file   Social History Narrative  . No narrative on file   History reviewed. No pertinent family history. Past Surgical History  Procedure Date  . Tubal ligation     Review of Systems: Systems reviewed and as per HPI, otherwise negative.  Physical Exam: Blood pressure 133/83, pulse 95, temperature 97.9 F (36.6 C), temperature source Oral, resp. rate 16, height 5\' 2"  (1.575 m), weight 98.884 kg (218 lb), last menstrual period 02/08/2011, SpO2 93.00%. BP 133/83  Pulse 95  Temp(Src) 97.9 F (36.6 C) (Oral)  Resp 16  Ht 5\' 2"  (1.575 m)  Wt 98.884 kg (218 lb)  BMI 39.87 kg/m2  SpO2 93%  LMP 02/08/2011  General Appearance:    Alert, obese. Speaking in full sentences.   Head:    Normocephalic, without obvious abnormality, atraumatic  Eyes:    PERRL, conjunctiva/corneas clear, EOM's intact, fundi    benign, both eyes  Ears:    Normal TM's and external ear canals, both ears  Nose:   Nares normal, septum midline, mucosa  normal, no drainage    or sinus tenderness  Throat:   Lips, mucosa, and tongue normal; teeth and gums normal  Neck:   Supple, symmetrical, trachea midline, no adenopathy;    thyroid:  no enlargement/tenderness/nodules; no carotid   bruit or JVD  Back:     Symmetric, no curvature, ROM normal, no CVA tenderness  Lungs:     bilateral wheeze and rhonchi with moderate air movement.   Chest Wall:    No tenderness or deformity   Heart:    Regular rate and rhythm, S1 and S2 normal, no murmur, rub   or gallop     Abdomen:     Soft, non-tender, bowel sounds active all four quadrants,    no masses, no organomegaly  Genitalia:   deferred   Rectal:   deferred   Extremities:   Extremities normal, atraumatic, no cyanosis or edema  Pulses:   2+ and symmetric all extremities    Skin:   Skin color, texture, turgor normal, no rashes or lesions  Lymph nodes:   Cervical, supraclavicular, and axillary nodes normal  Neurologic:   CNII-XII intact, normal strength, sensation and reflexes    throughout    Psychiatric: Normal affect  Lab results: Basic Metabolic Panel:  Basename 02/11/11 0648 02/10/11 1515 02/10/11 1452  NA 137 140 --  K 3.6 3.0* --  CL 102 99 --  CO2 28 -- --  GLUCOSE 208* 133* --  BUN 9 6 --  CREATININE 0.42* 0.70 --  CALCIUM 8.8 -- --  MG -- -- 2.0  PHOS -- -- --   Liver Function Tests: No results found for this basename: AST:2,ALT:2,ALKPHOS:2,BILITOT:2,PROT:2,ALBUMIN:2 in the last 72 hours No results found for this basename: LIPASE:2,AMYLASE:2 in the last 72 hours No results found for this basename: AMMONIA:2 in the last 72 hours CBC:  Basename 02/11/11 0648 02/10/11 1515 02/10/11 1425  WBC 6.0 -- 6.8  NEUTROABS 4.1 -- --  HGB 13.1 15.6* --  HCT 39.0 46.0 --  MCV 85.5 -- 86.6  PLT 354 -- 365   Cardiac Enzymes: No results found for this basename: CKTOTAL:3,CKMB:3,CKMBINDEX:3,TROPONINI:3 in the last 72 hours BNP: No components found with this basename: POCBNP:3 D-Dimer:  [facility] 02/10/11 1452  DDIMER 0.42   Imaging results:  Dg Chest 2 View  02/10/2011  *RADIOLOGY REPORT*  Clinical Data: Rule out infiltrate.  Cough, COPD, hypertension.  CHEST - 2 VIEW  Comparison: 05/02/2006  Findings: Heart size is normal.  There is perihilar peribronchial thickening.  There are no focal consolidations or pleural effusions.  No evidence for pulmonary edema. Visualized osseous structures have a normal appearance.  IMPRESSION: Bronchitic changes.  Original Report Authenticated By: Patterson Hammersmith, M.D.    Assessment & Plan: Active Problems:  Acute bronchitis  COPD exacerbation  Hypoxia  Benign hypertension  CAD  Obesity  Depression  The patient will be placed on observation. Continue steroids, oxygen, bronchodilators. Z-Pak. Smoking  cessation encouraged. Continue outpatient medications except diuretics. Supportive care. IV fluids.  Danai Gotto L 02/11/2011, 8:20 AM

## 2011-12-21 ENCOUNTER — Ambulatory Visit: Payer: Self-pay | Admitting: Physician Assistant

## 2012-05-12 ENCOUNTER — Other Ambulatory Visit: Payer: Self-pay | Admitting: Family Medicine

## 2012-05-12 DIAGNOSIS — E785 Hyperlipidemia, unspecified: Secondary | ICD-10-CM

## 2012-05-12 LAB — COMPLETE METABOLIC PANEL WITH GFR
ALT: 19 U/L (ref 0–35)
AST: 15 U/L (ref 0–37)
Albumin: 4.5 g/dL (ref 3.5–5.2)
Alkaline Phosphatase: 58 U/L (ref 39–117)
BUN: 8 mg/dL (ref 6–23)
CO2: 28 mEq/L (ref 19–32)
Calcium: 9.9 mg/dL (ref 8.4–10.5)
Chloride: 100 mEq/L (ref 96–112)
Creat: 0.39 mg/dL — ABNORMAL LOW (ref 0.50–1.10)
GFR, Est African American: >89 mL/min
GFR, Est Non African American: >89 mL/min
Glucose, Bld: 90 mg/dL (ref 70–99)
Potassium: 4 mEq/L (ref 3.5–5.3)
Sodium: 139 mEq/L (ref 135–145)
Total Bilirubin: 0.2 mg/dL — ABNORMAL LOW (ref 0.3–1.2)
Total Protein: 6.7 g/dL (ref 6.0–8.3)

## 2012-05-13 ENCOUNTER — Ambulatory Visit: Payer: Self-pay | Admitting: Physician Assistant

## 2012-05-13 LAB — VITAMIN D 25 HYDROXY (VIT D DEFICIENCY, FRACTURES): Vit D, 25-Hydroxy: 29 ng/mL — ABNORMAL LOW (ref 30–89)

## 2012-05-13 LAB — MICROALBUMIN, URINE: Microalb, Ur: 2.45 mg/dL — ABNORMAL HIGH (ref 0.00–1.89)

## 2012-05-15 LAB — NMR LIPOPROFILE WITH LIPIDS
Cholesterol, Total: 178 mg/dL (ref <200)
HDL Particle Number: 32.2 umol/L (ref >=30.5)
HDL Size: 8.5 nm — ABNORMAL LOW (ref >=9.2)
HDL-C: 37 mg/dL — ABNORMAL LOW (ref >=40)
LDL (calc): 117 mg/dL — ABNORMAL HIGH (ref <100)
LDL Particle Number: 1680 nmol/L — ABNORMAL HIGH (ref <1000)
LDL Size: 20 nm — ABNORMAL LOW (ref >20.5)
LP-IR Score: 73 — ABNORMAL HIGH (ref <=45)
Large HDL-P: 2.2 umol/L — ABNORMAL LOW (ref >=4.8)
Large VLDL-P: 3.8 nmol/L — ABNORMAL HIGH (ref <=2.7)
Small LDL Particle Number: 1183 nmol/L — ABNORMAL HIGH (ref <=527)
Triglycerides: 120 mg/dL (ref <150)
VLDL Size: 45.6 nm (ref >=46.6)

## 2012-05-16 ENCOUNTER — Encounter: Payer: Self-pay | Admitting: Cardiology

## 2012-05-16 ENCOUNTER — Other Ambulatory Visit: Payer: Self-pay | Admitting: Family Medicine

## 2012-05-16 MED ORDER — EZETIMIBE 10 MG PO TABS: 10.0000 mg | ORAL_TABLET | Freq: Every day | ORAL | Status: DC

## 2012-05-16 NOTE — Addendum Note (Signed)
Addended by: Ileana Ladd on: 05/16/2012 03:58 PM   Modules accepted: Orders

## 2012-05-21 ENCOUNTER — Ambulatory Visit: Payer: Self-pay | Admitting: Physician Assistant

## 2012-05-21 ENCOUNTER — Telehealth: Payer: Self-pay | Admitting: Family Medicine

## 2012-05-21 NOTE — Telephone Encounter (Signed)
Medicaid is not covering new rx for cholesterol meds. Needs prior auth. Please call

## 2012-05-23 ENCOUNTER — Other Ambulatory Visit: Payer: Self-pay | Admitting: Family Medicine

## 2012-05-23 ENCOUNTER — Telehealth: Payer: Self-pay | Admitting: Family Medicine

## 2012-05-23 MED ORDER — HYDROCODONE-ACETAMINOPHEN 5-500 MG PO TABS: 1.0000 | ORAL_TABLET | Freq: Three times a day (TID) | ORAL | Status: DC | PRN

## 2012-05-23 MED ORDER — HYDROCODONE-ACETAMINOPHEN 5-325 MG PO TABS: 1.0000 | ORAL_TABLET | Freq: Three times a day (TID) | ORAL | Status: DC | PRN

## 2012-05-23 MED ORDER — HYDROCODONE-APAP-DIETARY PROD 5-500 MG PO MISC: 1.0000 | Freq: Three times a day (TID) | ORAL | Status: DC | PRN

## 2012-05-23 NOTE — Telephone Encounter (Signed)
Prescription written please call in

## 2012-05-23 NOTE — Telephone Encounter (Signed)
Monta says dr. Modesto Charon only gave her pain meds  for one a day and she has been taking 2 per day. Because he wanted her to try to cut back however she says she is almost out and needs more.  I advised her to get pharmacy to send [place] a refill request  And we would get Dr. Mod[place] Charon to determine status.  She was also concerned about her new cholesterol med, pharm told her it had to be approved, we have not rec'd a request.  Told her we would look into [place] request.

## 2012-05-23 NOTE — Telephone Encounter (Signed)
Pt aware to pick up rx 

## 2012-05-26 NOTE — Telephone Encounter (Signed)
Did u take care of her pain med? i am working on prior for cholesterol med

## 2012-05-26 NOTE — Telephone Encounter (Signed)
Spoke with pt --she picked up her pain med rx last week.  Wants to be called when prior auth completed.

## 2012-05-28 NOTE — Telephone Encounter (Signed)
Done by Marlynn Perking

## 2012-05-29 ENCOUNTER — Telehealth: Payer: Self-pay | Admitting: Family Medicine

## 2012-05-29 NOTE — Telephone Encounter (Signed)
She'll be on the Zetia and pravastatin

## 2012-05-30 ENCOUNTER — Ambulatory Visit: Payer: Self-pay | Admitting: Physician Assistant

## 2012-05-30 NOTE — Telephone Encounter (Signed)
Pt.notified

## 2012-06-04 ENCOUNTER — Ambulatory Visit (INDEPENDENT_AMBULATORY_CARE_PROVIDER_SITE_OTHER): Payer: Self-pay | Admitting: Cardiology

## 2012-06-04 ENCOUNTER — Encounter: Payer: Self-pay | Admitting: Cardiology

## 2012-06-04 VITALS — BP 138/82 | HR 80 | Ht 62.0 in | Wt 203.0 lb

## 2012-06-04 DIAGNOSIS — I2510 Atherosclerotic heart disease of native coronary artery without angina pectoris: Secondary | ICD-10-CM

## 2012-06-04 DIAGNOSIS — I10 Essential (primary) hypertension: Secondary | ICD-10-CM

## 2012-06-04 DIAGNOSIS — E669 Obesity, unspecified: Secondary | ICD-10-CM

## 2012-06-04 NOTE — Progress Notes (Signed)
HPI The patient presents for followup of known coronary disease. She was last seen in our clinic in 2010. She's had previous cardiac catheterizations the last one demonstrating 50% LAD stenosis and an 80% ostial diagonal stenosis. This was managed medically. Her last stress test was in 2010. She has significant cardiovascular risk factors. She did have some sharp discomfort about a month ago in her chest after doing some outside activity. She has not had a recurrence of this. She did not describe associated symptoms. She does describe some shortness of breath with activities such as walking back and forth up her basement stairs.  She does not describe resting shortness of breath, PND or orthopnea. She does not have palpitations, presyncope or syncope. She was recently diagnosed with diabetes but she's lost quite a bit of weight. She unfortunately still smokes cigarettes.  No Known Allergies  Current Outpatient Prescriptions  Medication Sig Dispense Refill  . ACCU-CHEK FASTCLIX LANCETS MISC USE  EVERY DAY  102 each  0  . amLODipine (NORVASC) 10 MG tablet Take 10 mg by mouth daily.       . Aspirin-Acetaminophen-Caffeine (GOODYS EXTRA STRENGTH) 520-260-32.5 MG PACK Take 1 packet by mouth 2 (two) times daily as needed. For pain or headache       . ezetimibe (ZETIA) 10 MG tablet Take 1 tablet (10 mg total) by mouth daily.  30 tablet  3  . FLUoxetine (PROZAC) 20 MG capsule Take 20 mg by mouth 3 (three) times daily.       . hydrochlorothiazide (HYDRODIURIL) 25 MG tablet Take 25 mg by mouth daily.       Marland Kitchen HYDROcodone-acetaminophen (NORCO/VICODIN) 5-325 MG per tablet Take 1 tablet by mouth every 8 (eight) hours as needed for pain.  30 tablet  0  . pravastatin (PRAVACHOL) 40 MG tablet Take 40 mg by mouth daily.        No current facility-administered medications for this visit.    Past Medical History  Diagnosis Date  . Stroke   . COPD (chronic obstructive pulmonary disease)   . Hypertension   .  CAD (coronary artery disease)   . Diabetes     Past Surgical History  Procedure Laterality Date  . Tubal ligation      Family History  Problem Relation Age of Onset  . CAD Father 3    Died age 33  . CAD Brother 85  . CAD Mother 95    History   Social History  . Marital Status: Single    Spouse Name: N/A    Number of Children: 3  . Years of Education: N/A   Occupational History  . Not on file.   Social History Main Topics  . Smoking status: Current Every Day Smoker -- 1.00 packs/day for 28 years    Types: Cigarettes  . Smokeless tobacco: Not on file  . Alcohol Use: Yes     Comment: occas  . Drug Use: No  . Sexually Active: Not on file   Other Topics Concern  . Not on file   Social History Narrative   Lives at home with the father of her children.      ROS:  Positive for headaches, palpitations, occasional nausea, back pain.  Otherwise as stated in the HPI and negative for all other systems.  PHYSICAL EXAM BP 138/82  Pulse 80  Ht 5\' 2"  (1.575 m)  Wt 203 lb (92.08 kg)  BMI 37.12 kg/m2 GENERAL:  Well appearing HEENT:  Pupils equal  round and reactive, fundi not visualized, oral mucosa unremarkable NECK:  No jugular venous distention, waveform within normal limits, carotid upstroke brisk and symmetric, no bruits, no thyromegaly LYMPHATICS:  No cervical, inguinal adenopathy LUNGS:  Clear to auscultation bilaterally BACK:  No CVA tenderness CHEST:  Unremarkable HEART:  PMI not displaced or sustained,S1 and S2 within normal limits, no S3, no S4, no clicks, no rubs, no murmurs ABD:  Flat, positive bowel sounds normal in frequency in pitch, no bruits, no rebound, no guarding, no midline pulsatile mass, no hepatomegaly, no splenomegaly EXT:  2 plus pulses throughout, no edema, no cyanosis no clubbing SKIN:  No rashes no nodules NEURO:  Cranial nerves II through XII grossly intact, motor grossly intact throughout PSYCH:  Cognitively intact, oriented to person place  and time   EKG:  Sinus rhythm, rate 80, axis within normal limits, intervals within normal limits, no acute ST-T wave changes.  06/04/2012   ASSESSMENT AND PLAN  CAD:  The patient does have disease as described. She has significant risk factors. With the dyspnea she would not be a walk on a treadmill so she will need a Lexiscan Myoview.  She needs aggressive risk reduction.  OBESITY:  She has been losing weight.  She should continue with diet and exercise.  TOBACCO:  We discussed a specific strategy for tobacco cessation.  (Greater than three minutes discussing tobacco cessation.)  She could not afford the patches in [place] didn't work in the past. She will consider cold [place].  DIABETES:  We discussed weight loss and diet as a means to potentially cure her of her diabetes

## 2012-06-04 NOTE — Patient Instructions (Addendum)
The current medical regimen is effective;  continue present plan and medications.  Your physician has requested that you have a lexiscan myoview. For further information please visit [email or web address]. Please follow instruction sheet, as given.  Follow up in 1 year with Dr Hochrein.  You will receive a letter in the mail 2 months before you are due.  Please call us when you receive this letter to schedule your follow up appointment.  

## 2012-06-16 ENCOUNTER — Other Ambulatory Visit: Payer: Self-pay | Admitting: Family Medicine

## 2012-06-16 ENCOUNTER — Telehealth: Payer: Self-pay | Admitting: Family Medicine

## 2012-06-16 DIAGNOSIS — R52 Pain, unspecified: Secondary | ICD-10-CM

## 2012-06-16 DIAGNOSIS — E1159 Type 2 diabetes mellitus with other circulatory complications: Secondary | ICD-10-CM

## 2012-06-17 ENCOUNTER — Encounter (HOSPITAL_COMMUNITY): Payer: Self-pay

## 2012-06-17 ENCOUNTER — Other Ambulatory Visit: Payer: Self-pay | Admitting: Family Medicine

## 2012-06-17 MED ORDER — HYDROCODONE-ACETAMINOPHEN 5-325 MG PO TABS: 1.0000 | ORAL_TABLET | Freq: Two times a day (BID) | ORAL | Status: DC

## 2012-06-17 NOTE — Telephone Encounter (Signed)
Pt aware rx called in.  

## 2012-06-17 NOTE — Telephone Encounter (Signed)
Okay to refill # 60 of the Vicodin. And refill the lancets and if needed the test strips.

## 2012-06-17 NOTE — Telephone Encounter (Signed)
Pt aware rx called .

## 2012-06-17 NOTE — Telephone Encounter (Signed)
Pt aware  rx called to [facility]  

## 2012-06-17 NOTE — Telephone Encounter (Signed)
Pt aware rx called for hydrocodone ,lancets,and diabetic strips to [facility]

## 2012-07-10 NOTE — Telephone Encounter (Signed)
See documentation from 05/23/12

## 2012-07-15 ENCOUNTER — Other Ambulatory Visit: Payer: Self-pay | Admitting: Family Medicine

## 2012-07-15 ENCOUNTER — Telehealth: Payer: Self-pay | Admitting: Family Medicine

## 2012-07-15 NOTE — Telephone Encounter (Signed)
Did you want her to take Prozac 20 mg BID?

## 2012-07-16 ENCOUNTER — Telehealth: Payer: Self-pay | Admitting: *Deleted

## 2012-07-16 ENCOUNTER — Other Ambulatory Visit: Payer: Self-pay | Admitting: Family Medicine

## 2012-07-16 MED ORDER — FLUOXETINE HCL 20 MG PO CAPS: 20.0000 mg | ORAL_CAPSULE | Freq: Two times a day (BID) | ORAL | Status: DC

## 2012-07-16 NOTE — Telephone Encounter (Signed)
Last filled 06/17/12

## 2012-07-16 NOTE — Telephone Encounter (Signed)
Rx sent to [facility]

## 2012-07-16 NOTE — Telephone Encounter (Signed)
Last filled 06/06/12,  Pt must pick up rx, have nurse call her

## 2012-07-16 NOTE — Telephone Encounter (Signed)
Left message on cell phone voicemail that  meds were called in to [facility]. Other number not working

## 2012-07-18 ENCOUNTER — Other Ambulatory Visit: Payer: Self-pay | Admitting: Family Medicine

## 2012-07-23 ENCOUNTER — Ambulatory Visit (HOSPITAL_COMMUNITY): Payer: Medicaid Other | Attending: Cardiovascular Disease | Admitting: Radiology

## 2012-07-23 DIAGNOSIS — R0989 Other specified symptoms and signs involving the circulatory and respiratory systems: Secondary | ICD-10-CM

## 2012-07-23 DIAGNOSIS — I2510 Atherosclerotic heart disease of native coronary artery without angina pectoris: Secondary | ICD-10-CM

## 2012-07-31 ENCOUNTER — Ambulatory Visit (HOSPITAL_COMMUNITY): Payer: Medicaid Other | Attending: Cardiology | Admitting: Radiology

## 2012-07-31 VITALS — BP 127/75 | Ht 62.0 in | Wt 202.0 lb

## 2012-07-31 DIAGNOSIS — R0989 Other specified symptoms and signs involving the circulatory and respiratory systems: Secondary | ICD-10-CM | POA: Insufficient documentation

## 2012-07-31 DIAGNOSIS — Z8249 Family history of ischemic heart disease and other diseases of the circulatory system: Secondary | ICD-10-CM | POA: Insufficient documentation

## 2012-07-31 DIAGNOSIS — I2510 Atherosclerotic heart disease of native coronary artery without angina pectoris: Secondary | ICD-10-CM | POA: Insufficient documentation

## 2012-07-31 DIAGNOSIS — R079 Chest pain, unspecified: Secondary | ICD-10-CM | POA: Insufficient documentation

## 2012-07-31 DIAGNOSIS — R0609 Other forms of dyspnea: Secondary | ICD-10-CM | POA: Insufficient documentation

## 2012-07-31 DIAGNOSIS — F17200 Nicotine dependence, unspecified, uncomplicated: Secondary | ICD-10-CM | POA: Insufficient documentation

## 2012-07-31 DIAGNOSIS — R002 Palpitations: Secondary | ICD-10-CM | POA: Insufficient documentation

## 2012-07-31 DIAGNOSIS — R42 Dizziness and giddiness: Secondary | ICD-10-CM | POA: Insufficient documentation

## 2012-07-31 DIAGNOSIS — E119 Type 2 diabetes mellitus without complications: Secondary | ICD-10-CM | POA: Insufficient documentation

## 2012-07-31 DIAGNOSIS — R0602 Shortness of breath: Secondary | ICD-10-CM | POA: Insufficient documentation

## 2012-07-31 DIAGNOSIS — R000 Tachycardia, unspecified: Secondary | ICD-10-CM | POA: Insufficient documentation

## 2012-07-31 DIAGNOSIS — Z8673 Personal history of transient ischemic attack (TIA), and cerebral infarction without residual deficits: Secondary | ICD-10-CM | POA: Insufficient documentation

## 2012-07-31 MED ORDER — TECHNETIUM TC 99M SESTAMIBI GENERIC - CARDIOLITE
30.0000 | Freq: Once | INTRAVENOUS | Status: AC | PRN
  Administered 2012-07-31: 30 via INTRAVENOUS

## 2012-07-31 MED ORDER — REGADENOSON 0.4 MG/5ML IV SOLN
0.4000 mg | Freq: Once | INTRAVENOUS | Status: AC
  Administered 2012-07-31: 0.4 mg via INTRAVENOUS

## 2012-07-31 NOTE — Progress Notes (Signed)
[facility] SITE [address] NUCLEAR MED [address] [place], [place] [address] [phone]    Cardiology Nuclear Med Study  Madison Gentry is a 47 y.o. female     MRN : [id number]     DOB: Sep 04, 1965  Procedure Date: 07/31/2012  Nuclear Med Background Indication for Stress Test:  Evaluation for Ischemia History:  2010 MPS EF 67%, Normal, 2008 Catheterization with mod. CAD Cardiac Risk Factors: CVA, Family History - CAD, NIDDM and Smoker  Symptoms:  Chest Pain, DOE, Light-Headedness, Palpitations, Rapid HR and SOB   Nuclear Pre-Procedure Caffeine/Decaff Intake:  None NPO After: 8:00pm   Lungs:  clear O2 Sat: 95% on room air. IV 0.9% NS with Angio Cath:  22g  IV Site: R Hand  IV Started by:  Bonnita Levan, RN  Chest Size (in):  44 Cup Size: D  Height: 5\' 2"  (1.575 m)  Weight:  202 lb (91.627 kg)  BMI:  Body mass index is 36.94 kg/(m^2). Tech Comments:  Patient changed to a 2 day due to having goody powder    Nuclear Med Study 1 or 2 day study: 2 day  Stress Test Type:  Lexiscan  Reading MD: Dietrich Pates, MD  Order Authorizing Provider:  Rollene Rotunda, MD  Resting Radionuclide: Technetium 72m Sestamibi  Resting Radionuclide Dose: 33.0 mCi on 07/23/12   Stress Radionuclide:  Technetium 58m Sestamibi  Stress Radionuclide Dose: 33.0 mCi on 07/31/12           Stress Protocol Rest HR: 86 Stress HR: 117  Rest BP: 127/75 Stress BP: 141/79  Exercise Time (min): n/a METS: n/a   Predicted Max HR: 176 bpm % Max HR: 66.48 bpm Rate Pressure Product: 13244   Dose of Adenosine (mg):  n/a Dose of Lexiscan: 0.4 mg  Dose of Atropine (mg): n/a Dose of Dobutamine: n/a mcg/kg/min (at max HR)  Stress Test Technologist: Bonnita Levan, RN  Nuclear Technologist:  Domenic Polite, CNMT     Rest Procedure:  Myocardial perfusion imaging was performed at rest 45 minutes following the intravenous administration of Technetium 11m Sestamibi. Rest ECG: NSR - Normal EKG  Stress Procedure:  The patient  received IV Lexiscan 0.4 mg over 15-seconds.  Technetium 3m Sestamibi injected at 30-seconds.  Quantitative spect images were obtained after a 45 minute delay. Stress ECG: No significant change from baseline ECG  QPS Raw Data Images:  Rest images were motion corrected.  SOft tissue (diaphragm, breast) surround heart. Stress Images:  Mild thinning at basal anterior wall. Rest Images:  Normal homogeneous uptake in all areas of the myocardium. Subtraction (SDS):  Not signif for ischemia. Transient Ischemic Dilatation (Normal <1.22):  1.07 Lung/Heart Ratio (Normal <0.45):  0.25  Quantitative Gated Spect Images QGS EDV:  121 ml QGS ESV:  49 ml  Impression Exercise Capacity:  Lexiscan with no exercise. BP Response:  Normal blood pressure response. Clinical Symptoms:  Mild chest pain/dyspnea. ECG Impression:  No significant ST segment change suggestive of ischemia. Comparison with Prior Nuclear Study:  No change from previous scan.  Overall Impression:  Normal stress nuclear study.  LV Ejection Fraction: 59%.  LV Wall Motion:  NL LV Function; NL Wall Motion  Dietrich Pates

## 2012-08-18 ENCOUNTER — Telehealth: Payer: Self-pay | Admitting: Family Medicine

## 2012-08-19 ENCOUNTER — Telehealth: Payer: Self-pay | Admitting: Family Medicine

## 2012-08-19 NOTE — Telephone Encounter (Signed)
This may be refill x1

## 2012-08-19 NOTE — Telephone Encounter (Signed)
Last seen FPW 05/12/12, last filled 07/15/12. Rx will print, have nurse call pt at [phone]

## 2012-08-20 NOTE — Telephone Encounter (Signed)
This is okay x1 

## 2012-08-20 NOTE — Telephone Encounter (Signed)
WILL NEED TO PRINT RX AND HAVE NURSE CALL PT WHEN READY. THANKS.

## 2012-08-21 ENCOUNTER — Telehealth: Payer: Self-pay | Admitting: *Deleted

## 2012-08-21 NOTE — Telephone Encounter (Signed)
Called into [place] on 08/20/2012 Notified pt rx was called into pharmacy

## 2012-09-15 ENCOUNTER — Other Ambulatory Visit: Payer: Self-pay | Admitting: Family Medicine

## 2012-09-16 ENCOUNTER — Telehealth: Payer: Self-pay | Admitting: Family Medicine

## 2012-09-17 ENCOUNTER — Other Ambulatory Visit: Payer: Self-pay | Admitting: Family Medicine

## 2012-09-17 NOTE — Telephone Encounter (Signed)
LAST RF 08/20/12. LAST OV 3/14.

## 2012-09-18 ENCOUNTER — Other Ambulatory Visit: Payer: Self-pay

## 2012-09-18 ENCOUNTER — Other Ambulatory Visit: Payer: Self-pay | Admitting: Family Medicine

## 2012-09-18 NOTE — Telephone Encounter (Signed)
Not seen since [facility]   Last filled 07/15/12  [facility]   If approve print and route to nurse

## 2012-09-19 ENCOUNTER — Telehealth: Payer: Self-pay | Admitting: Family Medicine

## 2012-09-19 NOTE — Telephone Encounter (Signed)
Needs to be seen

## 2012-09-19 NOTE — Telephone Encounter (Signed)
Spoke with Madison Gentry and per dr Modesto Charon she was seen in March and advised to return to office in June and he has not seen . He feels that with her medical problems she needs to be reassessed before prescribing these pain medication. Madison Gentry expressed was having some pain and would like to be seen today if someone could see her. Call transferred to triage.  Madison Gentry has appt on Monday .

## 2012-09-19 NOTE — Telephone Encounter (Signed)
rx denied needs ov  Left mess for pt to return call for appt

## 2012-09-19 NOTE — Telephone Encounter (Signed)
appt given for Monday at 11:20 with Modesto Charon and she wants to know if you will give her anything to hold her over through the weekend

## 2012-09-22 ENCOUNTER — Ambulatory Visit (INDEPENDENT_AMBULATORY_CARE_PROVIDER_SITE_OTHER): Payer: Medicaid Other | Admitting: Family Medicine

## 2012-09-22 ENCOUNTER — Encounter: Payer: Self-pay | Admitting: Family Medicine

## 2012-09-22 ENCOUNTER — Ambulatory Visit (INDEPENDENT_AMBULATORY_CARE_PROVIDER_SITE_OTHER): Payer: Medicaid Other

## 2012-09-22 VITALS — BP 135/82 | HR 90 | Temp 97.9°F | Wt 205.2 lb

## 2012-09-22 DIAGNOSIS — E119 Type 2 diabetes mellitus without complications: Secondary | ICD-10-CM | POA: Insufficient documentation

## 2012-09-22 DIAGNOSIS — IMO0002 Reserved for concepts with insufficient information to code with codable children: Secondary | ICD-10-CM

## 2012-09-22 DIAGNOSIS — S3992XA Unspecified injury of lower back, initial encounter: Secondary | ICD-10-CM | POA: Insufficient documentation

## 2012-09-22 DIAGNOSIS — E669 Obesity, unspecified: Secondary | ICD-10-CM

## 2012-09-22 DIAGNOSIS — E8881 Metabolic syndrome: Secondary | ICD-10-CM | POA: Insufficient documentation

## 2012-09-22 DIAGNOSIS — E1169 Type 2 diabetes mellitus with other specified complication: Secondary | ICD-10-CM | POA: Insufficient documentation

## 2012-09-22 DIAGNOSIS — F17200 Nicotine dependence, unspecified, uncomplicated: Secondary | ICD-10-CM

## 2012-09-22 DIAGNOSIS — Z720 Tobacco use: Secondary | ICD-10-CM | POA: Insufficient documentation

## 2012-09-22 DIAGNOSIS — I2510 Atherosclerotic heart disease of native coronary artery without angina pectoris: Secondary | ICD-10-CM

## 2012-09-22 DIAGNOSIS — E785 Hyperlipidemia, unspecified: Secondary | ICD-10-CM

## 2012-09-22 MED ORDER — HYDROCODONE-ACETAMINOPHEN 5-325 MG PO TABS: ORAL_TABLET | ORAL | Status: DC

## 2012-09-22 MED ORDER — CYCLOBENZAPRINE HCL 5 MG PO TABS: 5.0000 mg | ORAL_TABLET | Freq: Three times a day (TID) | ORAL | Status: DC | PRN

## 2012-09-22 NOTE — Progress Notes (Signed)
Patient ID: Madison Gentry, female   DOB: [birth year], 47 y.o.   MRN: [id number] SUBJECTIVE: CC: Chief Complaint  Patient presents with  . Medication Refill    c/o back pain in the mornings and rt leg pain  onset years states getting worse. needs refill on hydrocodone   . Fall    states 2 weeks ago states lawn chair leg broke and she fell onto ground .    HPI: 1)As above. Chair broke and she fell. Right lower back pain. Chronically has back pain. Smoking 1 PPD. No weakness , no numbness.  2)Patient is here for follow up of Diabetes Mellitus/Hyperlipidemia/CAD/Hypertension: Symptoms evaluated Denies Nocturia ,Denies Urinary Frequency , denies Blurred vision ,deniesDizziness,denies.Dysuria,denies paresthesias, denies extremity pain or ulcers.Marland Kitchendenies chest pain. has had an annual eye exam. do check the feet. Does check CBGs. Average CBG:119-135 Denies episodes of hypoglycemia. Does have an emergency hypoglycemic plan. admits toCompliance with medications. Denies Problems with medications.  Breakfast: cereal, cheerios or maple brown sugar [email or web address] 2 %, Lunch: Ham, cheese sandwich , tomatoe Supper meat and 2 veggies. Hamburger, chicken, pork chops Veggies potato, peas. Diet sodas.  Past Medical History  Diagnosis Date  . Stroke   . COPD (chronic obstructive pulmonary disease)   . Hypertension   . CAD (coronary artery disease)   . Diabetes    Past Surgical History  Procedure Laterality Date  . Tubal ligation     History   Social History  . Marital Status: Single    Spouse Name: N/A    Number of Children: 3  . Years of Education: N/A   Occupational History  . Not on file.   Social History Main Topics  . Smoking status: Current Every Day Smoker -- 1.00 packs/day for 28 years    Types: Cigarettes  . Smokeless tobacco: Not on file  . Alcohol Use: Yes     Comment: occas  . Drug Use: No  . Sexually Active: Not on file   Other Topics Concern  . Not on file    Social History Narrative   Lives at home with the father of her children.     Family History  Problem Relation Age of Onset  . CAD Father 51    Died age 53  . CAD Brother 42  . CAD Mother 3   Current Outpatient Prescriptions on File Prior to Visit  Medication Sig Dispense Refill  . amLODipine (NORVASC) 10 MG tablet Take 10 mg by mouth daily.       . Aspirin-Acetaminophen-Caffeine (GOODYS EXTRA STRENGTH) 520-260-32.5 MG PACK Take 1 packet by mouth 2 (two) times daily as needed. For pain or headache       . ezetimibe (ZETIA) 10 MG tablet Take 1 tablet (10 mg total) by mouth daily.  30 tablet  3  . FLUoxetine (PROZAC) 20 MG capsule Take 1 capsule (20 mg total) by mouth 2 (two) times daily.  60 capsule  2  . hydrochlorothiazide (HYDRODIURIL) 25 MG tablet Take 25 mg by mouth daily.       Marland Kitchen HYDROcodone-acetaminophen (NORCO/VICODIN) 5-325 MG per tablet TAKE ONE TABLET BY MOUTH TWICE DAILY AS NEEDED  60 tablet  0  . pravastatin (PRAVACHOL) 40 MG tablet Take 40 mg by mouth daily.       Marland Kitchen ACCU-CHEK FASTCLIX LANCETS MISC USE EVERY DAY  100 each  12   No current facility-administered medications on file prior to visit.   No Known Allergies  There is no immunization history on  file for this patient. Prior to Admission medications   Medication Sig Start Date End Date Taking? Authorizing Provider  amLODipine (NORVASC) 10 MG tablet Take 10 mg by mouth daily.    Yes Historical Provider, MD  Aspirin-Acetaminophen-Caffeine (GOODYS EXTRA STRENGTH) 520-260-32.5 MG PACK Take 1 packet by mouth 2 (two) times daily as needed. For pain or headache    Yes Historical Provider, MD  ezetimibe (ZETIA) 10 MG tablet Take 1 tablet (10 mg total) by mouth daily. 05/16/12  Yes Ileana Ladd, MD  FLUoxetine (PROZAC) 20 MG capsule Take 1 capsule (20 mg total) by mouth 2 (two) times daily. 07/16/12  Yes Ileana Ladd, MD  hydrochlorothiazide (HYDRODIURIL) 25 MG tablet Take 25 mg by mouth daily.    Yes Historical  Provider, MD  HYDROcodone-acetaminophen (NORCO/VICODIN) 5-325 MG per tablet TAKE ONE TABLET BY MOUTH TWICE DAILY AS NEEDED 07/15/12  Yes Ileana Ladd, MD  metFORMIN (GLUCOPHAGE) 500 MG tablet Take 500 mg by mouth daily with breakfast.   Yes Historical Provider, MD  pravastatin (PRAVACHOL) 40 MG tablet Take 40 mg by mouth daily.    Yes Historical Provider, MD  ACCU-CHEK FASTCLIX LANCETS MISC USE EVERY DAY 06/16/12   Ileana Ladd, MD     ROS: As above in the HPI. All other systems are stable or negative.  OBJECTIVE: APPEARANCE:  Patient in no acute distress.The patient appeared well nourished and normally developed. Acyanotic. Waist: 43 inches VITAL SIGNS:BP 135/82  Pulse 90  Temp(Src) 97.9 F (36.6 C) (Oral)  Wt 205 lb 3.2 oz (93.078 kg)  BMI 37.52 kg/m2  LMP 09/12/2012  Obese WF  SKIN: warm and  Dry without overt rashes, tattoos and scars  HEAD and Neck: without JVD, Head and scalp: normal Eyes:No scleral icterus. Fundi normal, eye movements normal. Ears: Auricle normal, canal normal, Tympanic membranes normal, insufflation normal. Nose: normal Throat: normal Neck & thyroid: normal  CHEST & LUNGS: Chest wall: normal Lungs: Clear  CVS: Reveals the PMI to be normally located. Regular rhythm, First and Second Heart sounds are normal,  absence of murmurs, rubs or gallops. Peripheral vasculature: Radial pulses: normal Dorsal pedis pulses: normal Posterior pulses: normal  ABDOMEN:  Appearance: obese Benign, no organomegaly, no masses, no Abdominal Aortic enlargement. No Guarding , no rebound. No Bruits. Bowel sounds: normal  RECTAL: N/A GU: N/A  EXTREMETIES: nonedematous. Both Femoral and Pedal pulses are normal.  MUSCULOSKELETAL:  Spine: right paralumbar muscles tender with reduced ROM Joints: intact  NEUROLOGIC: oriented to time,place and person; nonfocal. Strength is normal Sensory is normal Reflexes are normal Cranial Nerves are  normal.  ASSESSMENT:  Back injury, initial encounter - Plan: DG Lumbar Spine 2-3 Views, cyclobenzaprine (FLEXERIL) 5 MG tablet, HYDROcodone-acetaminophen (NORCO/VICODIN) 5-325 MG per tablet  Obesity  Tobacco user  CAD  DM (diabetes mellitus)  HLD (hyperlipidemia)  Metabolic syndrome   PLAN: [facility] reading (PRIMARY) by  Dr. Modesto Charon:    Degenerative changes especially L5-S1  Smoking cessation in the AVS.  counseled 10 minutes on  Smoking and the need for smoking cessation.       Dr Woodroe Mode Recommendations  Diet and Exercise discussed with patient.  For nutrition information, I recommend books:  1).Eat to Live by Dr Monico Hoar. 2).Prevent and Reverse Heart Disease by Dr Suzzette Righter. 3) Dr Katherina Right Book:  Program to Reverse Diabetes  Exercise recommendations are:  If unable to walk, then the patient can exercise in a chair 3 times a day. By flapping  arms like a bird gently and raising legs outwards to the front.  If ambulatory, the patient can go for walks for 30 minutes 3 times a week. Then increase the intensity and duration as tolerated.  Goal is to try to attain exercise frequency to 5 times a week.  If applicable: Best to perform resistance exercises (machines or weights) 2 days a week and cardio type exercises 3 days per week.  Discussed need for Lifestyle changes.     Orders Placed This Encounter  Procedures  . DG Lumbar Spine 2-3 Views    Standing Status: Future     Number of Occurrences: 1     Standing Expiration Date: 11/22/2013    Order Specific Question:  Reason for Exam (SYMPTOM  OR DIAGNOSIS REQUIRED)    Answer:  left lower back pain exacerbation.chair broke.    Order Specific Question:  Is the patient pregnant?    Answer:  No    Order Specific Question:  Preferred imaging location?    Answer:  Internal   Meds ordered this encounter  Medications  . metFORMIN (GLUCOPHAGE) 500 MG tablet    Sig: Take 500 mg by mouth daily with  breakfast.  . cyclobenzaprine (FLEXERIL) 5 MG tablet    Sig: Take 1 tablet (5 mg total) by mouth 3 (three) times daily as needed for muscle spasms.    Dispense:  30 tablet    Refill:  1  . HYDROcodone-acetaminophen (NORCO/VICODIN) 5-325 MG per tablet    Sig: TAKE ONE TABLET BY MOUTH TWICE DAILY AS NEEDED    Dispense:  30 tablet    Refill:  0   Results for orders placed in visit on 05/12/12  NMR LIPOPROFILE WITH LIPIDS      Result Value Range   LDL Particle Number 1680 (*) <1000 nmol/L   LDL (calc) 117 (*) <100 mg/dL   HDL-C 37 (*) >=96 mg/dL   Triglycerides 045  <409 mg/dL   Cholesterol, Total 811  <200 mg/dL   HDL Particle Number 91.4  >=78.2 umol/L   Large HDL-P 2.2 (*) >=4.8 umol/L   Large VLDL-P 3.8 (*) <=2.7 nmol/L   Small LDL Particle Number 1183 (*) <=527 nmol/L   LDL Size 20.0 (*) >20.5 nm   HDL Size 8.5 (*) >=9.2 nm   VLDL Size 45.6  >=46.6 nm   LP-IR Score 73 (*) <=45    Return in about 6 weeks (around 11/03/2012) for to see pharmacist for DM education and med review, Recheck medical problems.  Lakai Moree P. Modesto Charon, M.D.

## 2012-09-22 NOTE — Patient Instructions (Addendum)
Smoking Cessation Quitting smoking is important to your health and has many advantages. However, it is not always easy to quit since nicotine is a very addictive drug. Often times, people try 3 times or more before being able to quit. This document explains the best ways for you to prepare to quit smoking. Quitting takes hard work and a lot of effort, but you can do it. ADVANTAGES OF QUITTING SMOKING  You will live longer, feel better, and live better.  Your body will feel the impact of quitting smoking almost immediately.  Within 20 minutes, blood pressure decreases. Your pulse returns to its normal level.  After 8 hours, carbon monoxide levels in the blood return to normal. Your oxygen level increases.  After 24 hours, the chance of having a heart attack starts to decrease. Your breath, hair, and body stop smelling like smoke.  After 48 hours, damaged nerve endings begin to recover. Your sense of taste and smell improve.  After 72 hours, the body is virtually free of nicotine. Your bronchial tubes relax and breathing becomes easier.  After 2 to 12 weeks, lungs can hold more air. Exercise becomes easier and circulation improves.  The risk of having a heart attack, stroke, cancer, or lung disease is greatly reduced.  After 1 year, the risk of coronary heart disease is cut in half.  After 5 years, the risk of stroke falls to the same as a nonsmoker.  After 10 years, the risk of lung cancer is cut in half and the risk of other cancers decreases significantly.  After 15 years, the risk of coronary heart disease drops, usually to the level of a nonsmoker.  If you are pregnant, quitting smoking will improve your chances of having a healthy baby.  The people you live with, especially any children, will be healthier.  You will have extra money to spend on things other than cigarettes. QUESTIONS TO THINK ABOUT BEFORE ATTEMPTING TO QUIT You may want to talk about your answers with your  caregiver.  Why do you want to quit?  If you tried to quit in the past, what helped and what did not?  What will be the most difficult situations for you after you quit? How will you plan to handle them?  Who can help you through the tough times? Your family? Friends? A caregiver?  What pleasures do you get from smoking? What ways can you still get pleasure if you quit? Here are some questions to ask your caregiver:  How can you help me to be successful at quitting?  What medicine do you think would be best for me and how should I take it?  What should I do if I need more help?  What is smoking withdrawal like? How can I get information on withdrawal? GET READY  Set a quit date.  Change your environment by getting rid of all cigarettes, ashtrays, matches, and lighters in your home, car, or work. Do not let people smoke in your home.  Review your past attempts to quit. Think about what worked and what did not. GET SUPPORT AND ENCOURAGEMENT You have a better chance of being successful if you have help. You can get support in many ways.  Tell your family, friends, and co-workers that you are going to quit and need their support. Ask them not to smoke around you.  Get individual, group, or telephone counseling and support. Programs are available at local hospitals and health centers. Call your local health department for   information about programs in your area.  Spiritual beliefs and practices may help some smokers quit.  Download a "quit meter" on your computer to keep track of quit statistics, such as how long you have gone without smoking, cigarettes not smoked, and money saved.  Get a self-help book about quitting smoking and staying off of tobacco. LEARN NEW SKILLS AND BEHAVIORS  Distract yourself from urges to smoke. Talk to someone, go for a walk, or occupy your time with a task.  Change your normal routine. Take a different route to work. Drink tea instead of coffee.  Eat breakfast in a different place.  Reduce your stress. Take a hot bath, exercise, or read a book.  Plan something enjoyable to do every day. Reward yourself for not smoking.  Explore interactive web-based programs that specialize in helping you quit. GET MEDICINE AND USE IT CORRECTLY Medicines can help you stop smoking and decrease the urge to smoke. Combining medicine with the above behavioral methods and support can greatly increase your chances of successfully quitting smoking.  Nicotine replacement therapy helps deliver nicotine to your body without the negative effects and risks of smoking. Nicotine replacement therapy includes nicotine gum, lozenges, inhalers, nasal sprays, and skin patches. Some may be available over-the-counter and others require a prescription.  Antidepressant medicine helps people abstain from smoking, but how this works is unknown. This medicine is available by prescription.  Nicotinic receptor partial agonist medicine simulates the effect of nicotine in your brain. This medicine is available by prescription. Ask your caregiver for advice about which medicines to use and how to use them based on your health history. Your caregiver will tell you what side effects to look out for if you choose to be on a medicine or therapy. Carefully read the information on the package. Do not use any other product containing nicotine while using a nicotine replacement product.  RELAPSE OR DIFFICULT SITUATIONS Most relapses occur within the first 3 months after quitting. Do not be discouraged if you start smoking again. Remember, most people try several times before finally quitting. You may have symptoms of withdrawal because your body is used to nicotine. You may crave cigarettes, be irritable, feel very hungry, cough often, get headaches, or have difficulty concentrating. The withdrawal symptoms are only temporary. They are strongest when you first quit, but they will go away within  10 14 days. To reduce the chances of relapse, try to:  Avoid drinking alcohol. Drinking lowers your chances of successfully quitting.  Reduce the amount of caffeine you consume. Once you quit smoking, the amount of caffeine in your body increases and can give you symptoms, such as a rapid heartbeat, sweating, and anxiety.  Avoid smokers because they can make you want to smoke.  Do not let weight gain distract you. Many smokers will gain weight when they quit, usually less than 10 pounds. Eat a healthy diet and stay active. You can always lose the weight gained after you quit.  Find ways to improve your mood other than smoking. FOR MORE INFORMATION  www.smokefree.gov  Document Released: 02/06/2001 Document Revised: 08/14/2011 Document Reviewed: 05/24/2011 [facility] Patient Information 2014 [place], [place].        Dr Woodroe Mode Recommendations  Diet and Exercise discussed with patient.  For nutrition information, I recommend books:  1).Eat to Live by Dr Monico Hoar. 2).Prevent and Reverse Heart Disease by Dr Suzzette Righter. 3) Dr Katherina Right Book:  Program to Reverse Diabetes  Exercise recommendations are:  If  unable to walk, then the patient can exercise in a chair 3 times a day. By flapping arms like a bird gently and raising legs outwards to the front.  If ambulatory, the patient can go for walks for 30 minutes 3 times a week. Then increase the intensity and duration as tolerated.  Goal is to try to attain exercise frequency to 5 times a week.  If applicable: Best to perform resistance exercises (machines or weights) 2 days a week and cardio type exercises 3 days per week.   Lumbosacral Strain Lumbosacral strain is one of the most common causes of back pain. There are many causes of back pain. Most are not serious conditions. CAUSES  Your backbone (spinal column) is made up of 24 main vertebral bodies, the sacrum, and the coccyx. These are held together by  muscles and tough, fibrous tissue (ligaments). Nerve roots pass through the openings between the vertebrae. A sudden move or injury to the back may cause injury to, or pressure on, these nerves. This may result in localized back pain or pain movement (radiation) into the buttocks, down the leg, and into the foot. Sharp, shooting pain from the buttock down the back of the leg (sciatica) is frequently associated with a ruptured (herniated) disk. Pain may be caused by muscle spasm alone. Your caregiver can often find the cause of your pain by the details of your symptoms and an exam. In some cases, you may need tests (such as X-rays). Your caregiver will work with you to decide if any tests are needed based on your specific exam. HOME CARE INSTRUCTIONS   Avoid an underactive lifestyle. Active exercise, as directed by your caregiver, is your greatest weapon against back pain.  Avoid hard physical activities (tennis, racquetball, waterskiing) if you are not in proper physical condition for it. This may aggravate or create problems.  If you have a back problem, avoid sports requiring sudden body movements. Swimming and walking are generally safer activities.  Maintain good posture.  Avoid becoming overweight (obese).  Use bed rest for only the most extreme, sudden (acute) episode. Your caregiver will help you determine how much bed rest is necessary.  For acute conditions, you may put ice on the injured area.  Put ice in a plastic bag.  Place a towel between your skin and the bag.  Leave the ice on for 15-20 minutes at a time, every 2 hours, or as needed.  After you are improved and more active, it may help to apply heat for 30 minutes before activities. See your caregiver if you are having pain that lasts longer than expected. Your caregiver can advise appropriate exercises or therapy if needed. With conditioning, most back problems can be avoided. SEEK IMMEDIATE MEDICAL CARE IF:   You have  numbness, tingling, weakness, or problems with the use of your arms or legs.  You experience severe back pain not relieved with medicines.  There is a change in bowel or bladder control.  You have increasing pain in any area of the body, including your belly (abdomen).  You notice shortness of breath, dizziness, or feel faint.  You feel sick to your stomach (nauseous), are throwing up (vomiting), or become sweaty.  You notice discoloration of your toes or legs, or your feet get very cold.  Your back pain is getting worse.  You have a fever. MAKE SURE YOU:   Understand these instructions.  Will watch your condition.  Will get help right away if you are  not doing well or get worse. Document Released: 11/22/2004 Document Revised: 05/07/2011 Document Reviewed: 05/14/2008 [facility] Patient Information 2014 [place], [place].

## 2012-09-25 ENCOUNTER — Other Ambulatory Visit: Payer: Self-pay | Admitting: Family Medicine

## 2012-10-10 ENCOUNTER — Other Ambulatory Visit: Payer: Self-pay | Admitting: Family Medicine

## 2012-10-13 ENCOUNTER — Other Ambulatory Visit: Payer: Self-pay | Admitting: Family Medicine

## 2012-10-13 ENCOUNTER — Telehealth: Payer: Self-pay | Admitting: Family Medicine

## 2012-10-13 NOTE — Telephone Encounter (Signed)
No! Not appropriate!! Do not refill. She received a Rx on 7/28. Needs to see Orthopedics.

## 2012-10-13 NOTE — Telephone Encounter (Signed)
Wong to address 

## 2012-10-13 NOTE — Telephone Encounter (Signed)
Do not refill. She was given a Rx on 09/22/2012. Not appropriate! Needs referral to orthopedics.

## 2012-10-13 NOTE — Telephone Encounter (Signed)
Last seen and filled 09/22/12   [facility]   If approved print and route to nurse

## 2012-10-14 ENCOUNTER — Other Ambulatory Visit: Payer: Self-pay

## 2012-10-14 ENCOUNTER — Telehealth: Payer: Self-pay | Admitting: Nurse Practitioner

## 2012-10-14 DIAGNOSIS — M549 Dorsalgia, unspecified: Secondary | ICD-10-CM

## 2012-10-14 NOTE — Telephone Encounter (Signed)
[facility] notified that hydrocodone has been denied.

## 2012-10-14 NOTE — Telephone Encounter (Signed)
Need to use heat and topicals , ben gays.

## 2012-10-14 NOTE — Telephone Encounter (Signed)
PT ADVISED DR. Modesto Charon REFUSED REFILL ON PAIN MED AND NEEDS REF TO ORTHOPEDICS. PT VERBALIZED UNDERSTANDING. DEBBIE B WILL INITATE REFERRAL

## 2012-10-14 NOTE — Telephone Encounter (Signed)
MED WAS REFUSED ALREADY AND REF SENT TO ORTH. PT CALLING AND REQUESTING PAIN MED UNTIL APT 11-07-12 HERE IN OFFICE.

## 2012-10-17 NOTE — Telephone Encounter (Signed)
Have tried several times on home number and cell phone numbers and busy or "not accepting calls"

## 2012-10-21 ENCOUNTER — Telehealth: Payer: Self-pay | Admitting: Family Medicine

## 2012-10-22 ENCOUNTER — Telehealth: Payer: Self-pay | Admitting: Family Medicine

## 2012-10-22 NOTE — Telephone Encounter (Signed)
Cell phone busy and home number not accepting calls

## 2012-10-22 NOTE — Telephone Encounter (Signed)
She will have to take  2 alleves twice  A day with food until then.

## 2012-10-22 NOTE — Telephone Encounter (Signed)
Duplicate message see message on 10-22-12  gh

## 2012-10-22 NOTE — Telephone Encounter (Signed)
Pt aware to take aleve as recommended until appt . Pt verbalized understanding

## 2012-11-03 ENCOUNTER — Ambulatory Visit: Payer: Medicaid Other

## 2012-11-07 ENCOUNTER — Ambulatory Visit (INDEPENDENT_AMBULATORY_CARE_PROVIDER_SITE_OTHER): Payer: Medicaid Other | Admitting: Nurse Practitioner

## 2012-11-07 ENCOUNTER — Encounter: Payer: Self-pay | Admitting: Nurse Practitioner

## 2012-11-07 VITALS — BP 154/91 | HR 89 | Temp 98.4°F | Ht 62.0 in | Wt 202.0 lb

## 2012-11-07 DIAGNOSIS — E785 Hyperlipidemia, unspecified: Secondary | ICD-10-CM

## 2012-11-07 DIAGNOSIS — I10 Essential (primary) hypertension: Secondary | ICD-10-CM | POA: Insufficient documentation

## 2012-11-07 DIAGNOSIS — J449 Chronic obstructive pulmonary disease, unspecified: Secondary | ICD-10-CM

## 2012-11-07 DIAGNOSIS — IMO0001 Reserved for inherently not codable concepts without codable children: Secondary | ICD-10-CM

## 2012-11-07 DIAGNOSIS — F329 Major depressive disorder, single episode, unspecified: Secondary | ICD-10-CM

## 2012-11-07 DIAGNOSIS — E119 Type 2 diabetes mellitus without complications: Secondary | ICD-10-CM

## 2012-11-07 DIAGNOSIS — S3992XA Unspecified injury of lower back, initial encounter: Secondary | ICD-10-CM

## 2012-11-07 LAB — POCT UA - MICROALBUMIN: Microalbumin Ur, POC: 20 mg/L

## 2012-11-07 LAB — POCT GLYCOSYLATED HEMOGLOBIN (HGB A1C): Hemoglobin A1C: 5.7

## 2012-11-07 MED ORDER — EZETIMIBE 10 MG PO TABS: 10.0000 mg | ORAL_TABLET | Freq: Every day | ORAL | Status: DC

## 2012-11-07 MED ORDER — AMLODIPINE BESYLATE 10 MG PO TABS: 10.0000 mg | ORAL_TABLET | Freq: Every day | ORAL | Status: DC

## 2012-11-07 MED ORDER — HYDROCODONE-ACETAMINOPHEN 5-325 MG PO TABS: ORAL_TABLET | ORAL | Status: DC

## 2012-11-07 MED ORDER — METFORMIN HCL 500 MG PO TABS: 500.0000 mg | ORAL_TABLET | Freq: Every day | ORAL | Status: DC

## 2012-11-07 MED ORDER — BUDESONIDE-FORMOTEROL FUMARATE 80-4.5 MCG/ACT IN AERO: 2.0000 | INHALATION_SPRAY | Freq: Two times a day (BID) | RESPIRATORY_TRACT | Status: DC

## 2012-11-07 MED ORDER — HYDROCHLOROTHIAZIDE 25 MG PO TABS: 25.0000 mg | ORAL_TABLET | Freq: Every day | ORAL | Status: DC

## 2012-11-07 MED ORDER — FLUOXETINE HCL 20 MG PO CAPS: 20.0000 mg | ORAL_CAPSULE | Freq: Two times a day (BID) | ORAL | Status: DC

## 2012-11-07 MED ORDER — PRAVASTATIN SODIUM 40 MG PO TABS: 40.0000 mg | ORAL_TABLET | Freq: Every day | ORAL | Status: DC

## 2012-11-07 NOTE — Progress Notes (Signed)
Subjective:    Patient ID: Madison Gentry, female    DOB: 06-22-1965, 47 y.o.   MRN: [id number]  Diabetes She presents for her follow-up diabetic visit. She has type 2 diabetes mellitus. No MedicAlert identification noted. The initial diagnosis of diabetes was made 1 year ago. Her disease course has been stable. There are no hypoglycemic associated symptoms. Pertinent negatives for hypoglycemia include no headaches. There are no diabetic associated symptoms. Pertinent negatives for diabetes include no blurred vision and no chest pain. There are no hypoglycemic complications. Symptoms are stable. There are no diabetic complications. Risk factors for coronary artery disease include dyslipidemia, family history, hypertension and obesity. Current diabetic treatment includes oral agent (monotherapy). She is compliant with treatment most of the time. Her weight is stable. When asked about meal planning, she reported none. She has not had a previous visit with a dietician. She rarely participates in exercise. Her breakfast blood glucose is taken between 8-9 am. Her breakfast blood glucose range is generally 110-130 mg/dl. Her highest blood glucose is 180-200 mg/dl. Her overall blood glucose range is 110-130 mg/dl. An ACE inhibitor/angiotensin II receptor blocker is not being taken. She does not see a podiatrist.Eye exam is not current.  Hyperlipidemia This is a chronic problem. The current episode started more than 1 year ago. The problem is uncontrolled. Recent lipid tests were reviewed and are high. Exacerbating diseases include diabetes and obesity. She has no history of hypothyroidism. There are no known factors aggravating her hyperlipidemia. Pertinent negatives include no chest pain, focal sensory loss, focal weakness, leg pain, myalgias or shortness of breath. Current antihyperlipidemic treatment includes statins. The current treatment provides moderate improvement of lipids. There are no compliance problems.   Risk factors for coronary artery disease include diabetes mellitus, hypertension, obesity and post-menopausal.  Hypertension This is a chronic problem. The current episode started more than 1 year ago. The problem is unchanged. The problem is uncontrolled. Pertinent negatives include no blurred vision, chest pain, headaches, neck pain, orthopnea, palpitations, peripheral edema or shortness of breath. There are no associated agents to hypertension. Risk factors for coronary artery disease include diabetes mellitus, dyslipidemia, family history and obesity. Past treatments include calcium channel blockers. The current treatment provides moderate improvement. There are no compliance problems.   Chronic Back Pain Was suppose to have seen back specialist, but didn't go to appointment- is on pain meds-vicodin BID- Has been on pain meds for awhile. Worse in AM- flexeril makes her sleepy so doesn't like taking Depression Prozac- working okay- no side effects COPD Only use albuterol HFA- has ran out but was using almost daily.- Does still smoke at least 1 pack a day Review of Systems  HENT: Negative for neck pain.   Eyes: Negative for blurred vision.  Respiratory: Negative for shortness of breath.   Cardiovascular: Negative for chest pain, palpitations and orthopnea.  Musculoskeletal: Negative for myalgias.  Neurological: Negative for focal weakness and headaches.       Objective:   Physical Exam  Constitutional: She is oriented to person, place, and time. She appears well-developed and well-nourished.  HENT:  Nose: Nose normal.  Mouth/Throat: Oropharynx is clear and moist. Dental caries present.  Eyes: EOM are normal.  Neck: Trachea normal, normal range of motion and full passive range of motion without pain. Neck supple. No JVD present. Carotid bruit is not present. No thyromegaly present.  Cardiovascular: Normal rate, regular rhythm, normal heart sounds and intact distal pulses.  Exam reveals no  gallop and  no friction rub.   No murmur heard. Pulmonary/Chest: Effort normal. She has wheezes (faint wheezes bil bases).  Deep tight cough  Abdominal: Soft. Bowel sounds are normal. She exhibits no distension and no mass. There is no tenderness.  Genitourinary: Uterus normal.  Musculoskeletal: Normal range of motion.  Lymphadenopathy:    She has no cervical adenopathy.  Neurological: She is alert and oriented to person, place, and time. She has normal reflexes.  Skin: Skin is warm and dry.  Psychiatric: She has a normal mood and affect. Her behavior is normal. Judgment and thought content normal.    BP 154/91  Pulse 89  Temp(Src) 98.4 F (36.9 C) (Oral)  Ht 5\' 2"  (1.575 m)  Wt 202 lb (91.627 kg)  BMI 36.94 kg/m2  Results for orders placed in visit on 11/07/12  POCT UA - MICROALBUMIN      Result Value Range   Microalbumin Ur, POC 20    POCT GLYCOSYLATED HEMOGLOBIN (HGB A1C)      Result Value Range   Hemoglobin A1C 5.7          Assessment & Plan:   1. Hypertension   2. Back injury, initial encounter   3. Hyperlipidemia LDL goal < 100   4. Depression   5. COPD bronchitis   6. Type II or unspecified type diabetes mellitus without mention of complication, not stated as uncontrolled    Orders Placed This Encounter  Procedures  . CMP14+EGFR  . NMR, lipoprofile  . Ambulatory referral to Orthopedic Surgery    Referral Priority:  Routine    Referral Type:  Surgical    Referral Reason:  Specialty Services Required    Requested Specialty:  Orthopedic Surgery    Number of Visits Requested:  1  . POCT UA - Microalbumin  . POCT glycosylated hemoglobin (Hb A1C)     Medication List       This list is accurate as of: 11/07/12 12:52 PM.  Always use your most recent med list.               ACCU-CHEK FASTCLIX LANCETS Misc  USE EVERY DAY     amLODipine 10 MG tablet  Commonly known as:  NORVASC  Take 1 tablet (10 mg total) by mouth daily.     budesonide-formoterol  80-4.5 MCG/ACT inhaler  Commonly known as:  SYMBICORT  Inhale 2 puffs into the lungs 2 (two) times daily.     cyclobenzaprine 5 MG tablet  Commonly known as:  FLEXERIL  Take 1 tablet (5 mg total) by mouth 3 (three) times daily as needed for muscle spasms.     ezetimibe 10 MG tablet  Commonly known as:  ZETIA  Take 1 tablet (10 mg total) by mouth daily.     FLUoxetine 20 MG capsule  Commonly known as:  PROZAC  Take 1 capsule (20 mg total) by mouth 2 (two) times daily.     GOODYS EXTRA STRENGTH 520-260-32.5 MG Pack  Generic drug:  Aspirin-Acetaminophen-Caffeine  Take 1 packet by mouth 2 (two) times daily as needed. For pain or headache     hydrochlorothiazide 25 MG tablet  Commonly known as:  HYDRODIURIL  Take 1 tablet (25 mg total) by mouth daily.     HYDROcodone-acetaminophen 5-325 MG per tablet  Commonly known as:  NORCO/VICODIN  TAKE ONE TABLET BY MOUTH TWICE DAILY AS NEEDED     metFORMIN 500 MG tablet  Commonly known as:  GLUCOPHAGE  Take 1 tablet (500 mg total)  by mouth daily with breakfast.     pravastatin 40 MG tablet  Commonly known as:  PRAVACHOL  Take 1 tablet (40 mg total) by mouth daily.       Labs pending Diet and exercise encouraged Keep appointment with Dr. Ethelene Hal when made this time No heavy lifting-moist heat to back Health maintenance reviewed Follow uo in 3 months  Mary-Margaret Daphine Deutscher, FNP

## 2012-11-07 NOTE — Patient Instructions (Signed)

## 2012-11-09 LAB — CMP14+EGFR
Albumin: 4.4 g/dL (ref 3.5–5.5)
Alkaline Phosphatase: 66 IU/L (ref 39–117)
BUN/Creatinine Ratio: 22 (ref 9–23)
BUN: 9 mg/dL (ref 6–24)
CO2: 27 mmol/L (ref 18–29)
Calcium: 10.1 mg/dL (ref 8.7–10.2)
Chloride: 96 mmol/L — ABNORMAL LOW (ref 97–108)
Creatinine, Ser: 0.41 mg/dL — ABNORMAL LOW (ref 0.57–1.00)
Globulin, Total: 2.3 g/dL (ref 1.5–4.5)
Glucose: 67 mg/dL (ref 65–99)
Total Protein: 6.7 g/dL (ref 6.0–8.5)

## 2012-11-09 LAB — NMR, LIPOPROFILE
Cholesterol: 151 mg/dL (ref <200)
HDL Cholesterol by NMR: 43 mg/dL (ref >=40)
LDL Particle Number: 1506 nmol/L — ABNORMAL HIGH (ref <1000)
LDLC SERPL CALC-MCNC: 68 mg/dL (ref <100)
LP-IR Score: 87 — ABNORMAL HIGH (ref <=45)

## 2012-11-10 ENCOUNTER — Other Ambulatory Visit: Payer: Self-pay | Admitting: Nurse Practitioner

## 2012-11-10 MED ORDER — SIMVASTATIN 40 MG PO TABS: 40.0000 mg | ORAL_TABLET | Freq: Every day | ORAL | Status: DC

## 2012-11-18 ENCOUNTER — Telehealth: Payer: Self-pay | Admitting: Nurse Practitioner

## 2012-11-19 NOTE — Telephone Encounter (Signed)
Pt has had two appts with Dr. Ethelene Hal and canceled. Has appt 12/01/12 with him and if doesn't go this time they will not see her per Carlon.

## 2012-11-20 NOTE — Telephone Encounter (Signed)
No pain med refill- patien thas been referred to dr Ethelene Hal and missed 2 appointments

## 2012-11-21 ENCOUNTER — Other Ambulatory Visit: Payer: Self-pay

## 2012-11-21 DIAGNOSIS — S3992XA Unspecified injury of lower back, initial encounter: Secondary | ICD-10-CM

## 2012-11-21 MED ORDER — HYDROCODONE-ACETAMINOPHEN 5-325 MG PO TABS: ORAL_TABLET | ORAL | Status: DC

## 2012-11-21 NOTE — Telephone Encounter (Signed)
Last seen and fill of #30 on 11/07/12   MMM  If approved print and route to nurse

## 2012-11-21 NOTE — Telephone Encounter (Signed)
rx ready for pickup 

## 2012-12-01 NOTE — Telephone Encounter (Signed)
Number not working alf

## 2012-12-11 ENCOUNTER — Telehealth: Payer: Self-pay | Admitting: Nurse Practitioner

## 2012-12-12 MED ORDER — SIMVASTATIN 40 MG PO TABS: 40.0000 mg | ORAL_TABLET | Freq: Every day | ORAL | Status: DC

## 2012-12-12 NOTE — Telephone Encounter (Signed)
What about the hydrocodone

## 2012-12-12 NOTE — Telephone Encounter (Signed)
pravachol was chenged to zocor on 11/10/12- rx was sent to pharmacy- did patient pick up- will resend

## 2012-12-15 ENCOUNTER — Telehealth: Payer: Self-pay | Admitting: Nurse Practitioner

## 2012-12-15 ENCOUNTER — Other Ambulatory Visit: Payer: Self-pay | Admitting: General Practice

## 2012-12-15 DIAGNOSIS — S3992XA Unspecified injury of lower back, initial encounter: Secondary | ICD-10-CM

## 2012-12-15 NOTE — Telephone Encounter (Signed)
Hydrocodone was filled on 11/21/12- to early for refill

## 2012-12-15 NOTE — Telephone Encounter (Signed)
zocor rx was already sent to pharmacy- to early for pain meds

## 2012-12-16 NOTE — Telephone Encounter (Signed)
Called pt and told her it could not be filled until 12/21/12

## 2012-12-18 MED ORDER — HYDROCODONE-ACETAMINOPHEN 5-325 MG PO TABS: ORAL_TABLET | ORAL | Status: DC

## 2012-12-18 NOTE — Telephone Encounter (Signed)
You can not take more than rx - it is only rx for 2x a day- rx ready for pick up

## 2012-12-19 NOTE — Telephone Encounter (Signed)
#   busy every time patient is called cant get in touch with patient

## 2012-12-19 NOTE — Telephone Encounter (Signed)
Patient was called by mmm told to come pick up

## 2012-12-19 NOTE — Telephone Encounter (Signed)
Patient contacted and told can only take pain meds BID and that is how written- patient knows to pick up rx

## 2013-01-12 ENCOUNTER — Other Ambulatory Visit: Payer: Self-pay | Admitting: Family Medicine

## 2013-01-12 ENCOUNTER — Telehealth: Payer: Self-pay | Admitting: Nurse Practitioner

## 2013-01-12 DIAGNOSIS — S3992XA Unspecified injury of lower back, initial encounter: Secondary | ICD-10-CM

## 2013-01-12 MED ORDER — HYDROCODONE-ACETAMINOPHEN 5-325 MG PO TABS: ORAL_TABLET | ORAL | Status: DC

## 2013-01-12 NOTE — Telephone Encounter (Signed)
rx ready for pickup 

## 2013-01-12 NOTE — Telephone Encounter (Signed)
Patient aware rx ready to be picked up 

## 2013-01-26 ENCOUNTER — Other Ambulatory Visit: Payer: Self-pay | Admitting: Family Medicine

## 2013-02-13 ENCOUNTER — Telehealth: Payer: Self-pay | Admitting: Nurse Practitioner

## 2013-02-13 DIAGNOSIS — S3992XA Unspecified injury of lower back, initial encounter: Secondary | ICD-10-CM

## 2013-02-13 MED ORDER — HYDROCODONE-ACETAMINOPHEN 5-325 MG PO TABS: ORAL_TABLET | ORAL | Status: DC

## 2013-02-13 NOTE — Telephone Encounter (Signed)
Cant get in touch with patient or daughter Rx is up front

## 2013-02-13 NOTE — Telephone Encounter (Signed)
rx ready for pickup 

## 2013-03-10 ENCOUNTER — Telehealth: Payer: Self-pay | Admitting: Nurse Practitioner

## 2013-03-10 MED ORDER — HYDROCODONE-ACETAMINOPHEN 5-325 MG PO TABS: ORAL_TABLET | ORAL | Status: DC

## 2013-03-10 NOTE — Telephone Encounter (Signed)
Pt notified to pick up rx.

## 2013-03-10 NOTE — Telephone Encounter (Signed)
rx ready for pickup 

## 2013-03-14 ENCOUNTER — Other Ambulatory Visit: Payer: Self-pay | Admitting: Nurse Practitioner

## 2013-03-30 ENCOUNTER — Telehealth: Payer: Self-pay | Admitting: Nurse Practitioner

## 2013-03-30 NOTE — Telephone Encounter (Signed)
Patient has tried otc medication and that it is causing her to urinate on herself

## 2013-03-30 NOTE — Telephone Encounter (Signed)
Patient already on hydrocodone which should help with cough because it contains codeine- try mucinex or delsym OTC

## 2013-03-31 MED ORDER — BENZONATATE 100 MG PO CAPS: 100.0000 mg | ORAL_CAPSULE | Freq: Two times a day (BID) | ORAL | Status: DC | PRN

## 2013-03-31 NOTE — Telephone Encounter (Signed)
Tessalon perles called in;

## 2013-04-07 ENCOUNTER — Telehealth: Payer: Self-pay | Admitting: Nurse Practitioner

## 2013-04-07 NOTE — Telephone Encounter (Signed)
ntbs for refill on pain meds- usually sees wong

## 2013-04-09 NOTE — Telephone Encounter (Signed)
Patient needs to schedule an appt., (not seen since 11-07-12),  Before she can have refill on pain medication.(per MMM)

## 2013-04-13 NOTE — Telephone Encounter (Signed)
It appears that the patient's request for refill on pain medication was denied because she needed an appointment. An appt had been scheduled for 2/18 but she cancelled it due to the impending inclement weather. She is now requesting that we provide a refill to last until she can be seen.

## 2013-04-13 NOTE — Telephone Encounter (Signed)
Patient needs to be seen. Patient has exceeded limit since last visit. Refill denied. Appointment of 2/18 is 2 days away and inclement weather is today. Bring all medications at next office visit.

## 2013-04-15 ENCOUNTER — Ambulatory Visit: Payer: Medicaid Other | Admitting: Nurse Practitioner

## 2013-04-15 ENCOUNTER — Ambulatory Visit (INDEPENDENT_AMBULATORY_CARE_PROVIDER_SITE_OTHER): Payer: Medicaid Other | Admitting: Nurse Practitioner

## 2013-04-15 VITALS — BP 152/104 | HR 109 | Temp 98.8°F | Ht 62.0 in | Wt 198.0 lb

## 2013-04-15 DIAGNOSIS — J0100 Acute maxillary sinusitis, unspecified: Secondary | ICD-10-CM

## 2013-04-15 DIAGNOSIS — J209 Acute bronchitis, unspecified: Secondary | ICD-10-CM

## 2013-04-15 DIAGNOSIS — M549 Dorsalgia, unspecified: Secondary | ICD-10-CM

## 2013-04-15 DIAGNOSIS — G8929 Other chronic pain: Secondary | ICD-10-CM

## 2013-04-15 MED ORDER — INCONTINENCE BRIEF LARGE MISC: 1.0000 | Status: DC | PRN

## 2013-04-15 MED ORDER — HYDROCODONE-ACETAMINOPHEN 5-325 MG PO TABS: ORAL_TABLET | ORAL | Status: DC

## 2013-04-15 MED ORDER — AZITHROMYCIN 250 MG PO TABS: ORAL_TABLET | ORAL | Status: DC

## 2013-04-15 MED ORDER — HYDROCODONE-HOMATROPINE 5-1.5 MG/5ML PO SYRP: 5.0000 mL | ORAL_SOLUTION | Freq: Three times a day (TID) | ORAL | Status: DC | PRN

## 2013-04-15 NOTE — Addendum Note (Signed)
Addended by: Bennie PieriniMARTIN, MARY-MARGARET on: 04/15/2013 11:22 AM   Modules accepted: Orders

## 2013-04-15 NOTE — Progress Notes (Signed)
Subjective:    Patient ID: Madison Gentry, female    DOB: [birth year], 48 y.o.   MRN: [id number]  HPI  Patient presents today for today for cold. Has had cold for 2 weeks. Symptoms include non-productive cough, runny nose, sore throat, R. Ear pain, chills, and patient felt fever. Family members in home have similar symptoms. Has tried multiple over the counter medications with no improvement. Symptoms have not changed over past 2 weeks,   Review of Systems  Constitutional: Positive for chills. Negative for fever and appetite change.  HENT: Positive for congestion, ear pain, postnasal drip, rhinorrhea, sinus pressure and sore throat.   Respiratory: Positive for cough.   Cardiovascular: Negative.   Gastrointestinal: Negative.   All other systems reviewed and are negative.       Objective:   Physical Exam  Constitutional: She is oriented to person, place, and time. She appears well-developed and well-nourished.  HENT:  Right Ear: Hearing, tympanic membrane, external ear and ear canal normal.  Left Ear: Hearing, tympanic membrane, external ear and ear canal normal.  Nose: Mucosal edema and rhinorrhea present. Right sinus exhibits maxillary sinus tenderness and frontal sinus tenderness. Left sinus exhibits maxillary sinus tenderness and frontal sinus tenderness.  Mouth/Throat: Uvula is midline, oropharynx is clear and moist and mucous membranes are normal.  Neck: Normal range of motion. Neck supple.  Cardiovascular: Normal rate, regular rhythm and normal heart sounds.   Pulmonary/Chest: Effort normal. She has wheezes (inspiratory).  Lymphadenopathy:    She has no cervical adenopathy.  Neurological: She is alert and oriented to person, place, and time.  Skin: Skin is warm.  Psychiatric: She has a normal mood and affect. Her behavior is normal. Judgment and thought content normal.   BP 152/104  Pulse 109  Temp(Src) 98.8 F (37.1 C) (Oral)  Ht 5\' 2"  (1.575 m)  Wt 198 lb (89.812 kg)   BMI 36.21 kg/m2  LMP 04/01/2013'       Assessment & Plan:   1. Sinusitis, acute maxillary   2. Acute bronchitis    Meds ordered this encounter  Medications  . azithromycin (ZITHROMAX Z-PAK) 250 MG tablet    Sig: As directed    Dispense:  6 each    Refill:  0    Order Specific Question:  Supervising Provider    Answer:  Ernestina Penna [[id number]]  . HYDROcodone-homatropine (HYCODAN) 5-1.5 MG/5ML syrup    Sig: Take 5 mLs by mouth every 8 (eight) hours as needed for cough.    Dispense:  120 mL    Refill:  0    Order Specific Question:  Supervising Provider    Answer:  Ernestina Penna [[id number]]   1. Take meds as prescribed 2. Use a cool mist humidifier especially during the winter months and when heat has been humid. 3. Use saline nose sprays frequently 4. Saline irrigations of the nose can be very helpful if done frequently.  * 4X daily for 1 week*  * Use of a nettie pot can be helpful with this. Follow directions with this* 5. Drink plenty of fluids 6. Keep thermostat turn down low 7.For any cough or congestion  Use plain Mucinex- regular strength or max strength is fine- NO DECONGESTANT!   * Children- consult with Pharmacist for dosing 8. For fever or aces or pains- take tylenol or ibuprofen appropriate for age and weight.  * for fevers greater than 101 orally you may alternate ibuprofen and tylenol every  3 hours.  Mary-Margaret Daphine DeutscherMartin, FNP

## 2013-04-15 NOTE — Patient Instructions (Signed)

## 2013-04-15 NOTE — Addendum Note (Signed)
Addended by: Yannet Rincon, MARY-MARGARET on: 04/15/2013 11:22 AM   Modules accepted: Orders  

## 2013-04-20 ENCOUNTER — Telehealth: Payer: Self-pay | Admitting: Nurse Practitioner

## 2013-04-20 MED ORDER — FLUCONAZOLE 150 MG PO TABS: 150.0000 mg | ORAL_TABLET | Freq: Every day | ORAL | Status: DC

## 2013-04-20 MED ORDER — HYDROCODONE-HOMATROPINE 5-1.5 MG/5ML PO SYRP: 5.0000 mL | ORAL_SOLUTION | Freq: Three times a day (TID) | ORAL | Status: DC | PRN

## 2013-04-20 NOTE — Telephone Encounter (Signed)
Took care of before lunch

## 2013-04-20 NOTE — Telephone Encounter (Signed)
Diflucan rx sent in NTBS for cough meds

## 2013-04-21 NOTE — Telephone Encounter (Signed)
Can you send in the diflucan?

## 2013-04-21 NOTE — Telephone Encounter (Signed)
Number busy RX up front

## 2013-04-21 NOTE — Telephone Encounter (Signed)
Did the diflucan the first time she called

## 2013-04-22 NOTE — Telephone Encounter (Signed)
Detailed message left that rx up front to pick up

## 2013-04-28 ENCOUNTER — Other Ambulatory Visit: Payer: Self-pay | Admitting: *Deleted

## 2013-04-28 DIAGNOSIS — I10 Essential (primary) hypertension: Secondary | ICD-10-CM

## 2013-04-28 MED ORDER — HYDROCHLOROTHIAZIDE 25 MG PO TABS: 25.0000 mg | ORAL_TABLET | Freq: Every day | ORAL | Status: DC

## 2013-05-02 ENCOUNTER — Other Ambulatory Visit: Payer: Self-pay | Admitting: Nurse Practitioner

## 2013-05-07 ENCOUNTER — Telehealth: Payer: Self-pay | Admitting: Nurse Practitioner

## 2013-05-07 NOTE — Telephone Encounter (Signed)
Was filled on 04/15/13- to early for refill

## 2013-05-08 NOTE — Telephone Encounter (Signed)
Again to early for refill

## 2013-05-09 NOTE — Telephone Encounter (Signed)
Patient aware that she has to wait until it is due.

## 2013-05-11 ENCOUNTER — Telehealth: Payer: Self-pay | Admitting: Nurse Practitioner

## 2013-05-11 DIAGNOSIS — I10 Essential (primary) hypertension: Secondary | ICD-10-CM

## 2013-05-11 MED ORDER — BUSPIRONE HCL 7.5 MG PO TABS: ORAL_TABLET | ORAL | Status: DC

## 2013-05-11 NOTE — Telephone Encounter (Signed)
Refilled buspar On 2 different heart pills and didn't know which one she needs No pain meds- NTBS

## 2013-05-12 ENCOUNTER — Other Ambulatory Visit: Payer: Self-pay

## 2013-05-12 MED ORDER — AMLODIPINE BESYLATE 10 MG PO TABS: 10.0000 mg | ORAL_TABLET | Freq: Every day | ORAL | Status: DC

## 2013-05-12 NOTE — Telephone Encounter (Signed)
Patient aware that she NTBS. She was going to schedule an appt for tomorrow but she is currently dismissed and I couldn't schedule the appt.  She was transferred to the billing department to discuss her bad debt.

## 2013-05-12 NOTE — Telephone Encounter (Signed)
Last seen 04/15/13  MMM If approved print and route to nurse 

## 2013-05-12 NOTE — Telephone Encounter (Signed)
Again no pain  meds- NTBS

## 2013-05-12 NOTE — Telephone Encounter (Signed)
Patient willing to schedule appt. She had another back injury and wants to be seen ASAP. Patient owes a balance and can be scheduled after settling that.

## 2013-05-13 ENCOUNTER — Telehealth: Payer: Self-pay | Admitting: Nurse Practitioner

## 2013-05-13 DIAGNOSIS — E119 Type 2 diabetes mellitus without complications: Secondary | ICD-10-CM

## 2013-05-13 MED ORDER — METFORMIN HCL 500 MG PO TABS: 500.0000 mg | ORAL_TABLET | Freq: Every day | ORAL | Status: DC

## 2013-05-13 MED ORDER — HYDROCODONE-ACETAMINOPHEN 5-325 MG PO TABS: ORAL_TABLET | ORAL | Status: DC

## 2013-05-13 NOTE — Telephone Encounter (Signed)
LM FOR PATIENT THAT RX IS UP FRONT READY TO BE PICKED UP WHEN EVER SHE IS READY

## 2013-05-13 NOTE — Telephone Encounter (Signed)
rx ready for pickup 

## 2013-05-14 ENCOUNTER — Encounter: Payer: Self-pay | Admitting: Physical Medicine & Rehabilitation

## 2013-05-15 ENCOUNTER — Ambulatory Visit: Payer: Medicaid Other | Admitting: Nurse Practitioner

## 2013-06-03 ENCOUNTER — Other Ambulatory Visit: Payer: Self-pay | Admitting: *Deleted

## 2013-06-03 DIAGNOSIS — E785 Hyperlipidemia, unspecified: Secondary | ICD-10-CM

## 2013-06-03 MED ORDER — EZETIMIBE 10 MG PO TABS: 10.0000 mg | ORAL_TABLET | Freq: Every day | ORAL | Status: DC

## 2013-06-03 NOTE — Telephone Encounter (Signed)
Last labs 9/14.

## 2013-06-03 NOTE — Telephone Encounter (Signed)
Patient NTBS for follow up and lab work  

## 2013-06-08 ENCOUNTER — Encounter: Payer: Self-pay | Admitting: *Deleted

## 2013-06-09 ENCOUNTER — Telehealth: Payer: Self-pay | Admitting: Nurse Practitioner

## 2013-06-09 NOTE — Telephone Encounter (Signed)
Patient NTBS for follow up and lab work No refills until seen 

## 2013-06-12 NOTE — Telephone Encounter (Signed)
Pt aware she must be seen.  appt is on Monday 4/20 w/ mmm.  rs

## 2013-06-15 ENCOUNTER — Encounter: Payer: Self-pay | Admitting: Nurse Practitioner

## 2013-06-15 ENCOUNTER — Ambulatory Visit (INDEPENDENT_AMBULATORY_CARE_PROVIDER_SITE_OTHER): Payer: Medicaid Other | Admitting: Nurse Practitioner

## 2013-06-15 VITALS — BP 149/91 | HR 99 | Temp 98.7°F | Ht 62.0 in | Wt 193.0 lb

## 2013-06-15 DIAGNOSIS — F329 Major depressive disorder, single episode, unspecified: Secondary | ICD-10-CM

## 2013-06-15 DIAGNOSIS — Z6835 Body mass index (BMI) 35.0-35.9, adult: Secondary | ICD-10-CM

## 2013-06-15 DIAGNOSIS — E785 Hyperlipidemia, unspecified: Secondary | ICD-10-CM

## 2013-06-15 DIAGNOSIS — F3289 Other specified depressive episodes: Secondary | ICD-10-CM

## 2013-06-15 DIAGNOSIS — R32 Unspecified urinary incontinence: Secondary | ICD-10-CM

## 2013-06-15 DIAGNOSIS — Z720 Tobacco use: Secondary | ICD-10-CM

## 2013-06-15 DIAGNOSIS — Z713 Dietary counseling and surveillance: Secondary | ICD-10-CM

## 2013-06-15 DIAGNOSIS — IMO0001 Reserved for inherently not codable concepts without codable children: Secondary | ICD-10-CM

## 2013-06-15 DIAGNOSIS — F32A Depression, unspecified: Secondary | ICD-10-CM

## 2013-06-15 DIAGNOSIS — E559 Vitamin D deficiency, unspecified: Secondary | ICD-10-CM

## 2013-06-15 DIAGNOSIS — E669 Obesity, unspecified: Secondary | ICD-10-CM

## 2013-06-15 DIAGNOSIS — M549 Dorsalgia, unspecified: Secondary | ICD-10-CM

## 2013-06-15 DIAGNOSIS — I10 Essential (primary) hypertension: Secondary | ICD-10-CM

## 2013-06-15 DIAGNOSIS — J449 Chronic obstructive pulmonary disease, unspecified: Secondary | ICD-10-CM

## 2013-06-15 DIAGNOSIS — E119 Type 2 diabetes mellitus without complications: Secondary | ICD-10-CM

## 2013-06-15 DIAGNOSIS — F17200 Nicotine dependence, unspecified, uncomplicated: Secondary | ICD-10-CM

## 2013-06-15 LAB — POCT GLYCOSYLATED HEMOGLOBIN (HGB A1C): Hemoglobin A1C: 5.7

## 2013-06-15 MED ORDER — BUSPIRONE HCL 7.5 MG PO TABS: ORAL_TABLET | ORAL | Status: DC

## 2013-06-15 MED ORDER — HYDROCHLOROTHIAZIDE 25 MG PO TABS: 25.0000 mg | ORAL_TABLET | Freq: Every day | ORAL | Status: DC

## 2013-06-15 MED ORDER — SIMVASTATIN 40 MG PO TABS: 40.0000 mg | ORAL_TABLET | Freq: Every day | ORAL | Status: DC

## 2013-06-15 MED ORDER — HYDROCODONE-ACETAMINOPHEN 5-325 MG PO TABS: ORAL_TABLET | ORAL | Status: DC

## 2013-06-15 MED ORDER — AMLODIPINE BESYLATE 10 MG PO TABS: 10.0000 mg | ORAL_TABLET | Freq: Every day | ORAL | Status: DC

## 2013-06-15 MED ORDER — EZETIMIBE 10 MG PO TABS: 10.0000 mg | ORAL_TABLET | Freq: Every day | ORAL | Status: DC

## 2013-06-15 MED ORDER — INCONTINENCE BRIEF LARGE MISC: 1.0000 | Status: DC | PRN

## 2013-06-15 MED ORDER — GLUCOSE BLOOD VI STRP: ORAL_STRIP | Status: DC

## 2013-06-15 MED ORDER — FLUOXETINE HCL 20 MG PO CAPS: 20.0000 mg | ORAL_CAPSULE | Freq: Two times a day (BID) | ORAL | Status: DC

## 2013-06-15 NOTE — Patient Instructions (Signed)

## 2013-06-15 NOTE — Progress Notes (Signed)
Subjective:    Patient ID: Madison Gentry, female    DOB: [birth year], 48 y.o.   MRN: [id number]  Patient here today for follow up of chronic medical problems.  Cough Pertinent negatives include no chest pain, headaches, myalgias or shortness of breath.  Diabetes She presents for her follow-up diabetic visit. She has type 2 diabetes mellitus. No MedicAlert identification noted. The initial diagnosis of diabetes was made 1 year ago. Her disease course has been stable. There are no hypoglycemic associated symptoms. Pertinent negatives for hypoglycemia include no headaches. There are no diabetic associated symptoms. Pertinent negatives for diabetes include no blurred vision and no chest pain. There are no hypoglycemic complications. Symptoms are stable. There are no diabetic complications. Risk factors for coronary artery disease include dyslipidemia, family history, hypertension and obesity. Current diabetic treatment includes oral agent (monotherapy). She is compliant with treatment most of the time. Her weight is stable. When asked about meal planning, she reported none. She has not had a previous visit with a dietician. She rarely participates in exercise. Her breakfast blood glucose is taken between 8-9 am. Her breakfast blood glucose range is generally 110-130 mg/dl. Her highest blood glucose is 180-200 mg/dl. Her overall blood glucose range is 110-130 mg/dl. An ACE inhibitor/angiotensin II receptor blocker is not being taken. She does not see a podiatrist.Eye exam is not current.  Hyperlipidemia This is a chronic problem. The current episode started more than 1 year ago. The problem is uncontrolled. Recent lipid tests were reviewed and are high. Exacerbating diseases include diabetes and obesity. She has no history of hypothyroidism. There are no known factors aggravating her hyperlipidemia. Pertinent negatives include no chest pain, focal sensory loss, focal weakness, leg pain, myalgias or shortness of  breath. Current antihyperlipidemic treatment includes statins. The current treatment provides moderate improvement of lipids. There are no compliance problems.  Risk factors for coronary artery disease include diabetes mellitus, hypertension, obesity and post-menopausal.  Hypertension This is a chronic problem. The current episode started more than 1 year ago. The problem is unchanged. The problem is uncontrolled. Pertinent negatives include no blurred vision, chest pain, headaches, neck pain, orthopnea, palpitations, peripheral edema or shortness of breath. There are no associated agents to hypertension. Risk factors for coronary artery disease include diabetes mellitus, dyslipidemia, family history and obesity. Past treatments include calcium channel blockers. The current treatment provides moderate improvement. There are no compliance problems.   Chronic Back Pain Was suppose to have seen back specialist, but didn't go to appointment- is on pain meds-vicodin BID- Has been on pain meds for awhile. Worse in AM- flexeril makes her sleepy so doesn't like taking. Has appointment with pain clinic next month. Needs refill to last until has appointment. Depression Prozac- working okay- no side effects COPD Only use albuterol HFA- has ran out but was using almost daily.- Does still smoke at least 1 pack a day Review of Systems  Eyes: Negative for blurred vision.  Respiratory: Positive for cough. Negative for shortness of breath.   Cardiovascular: Negative for chest pain, palpitations and orthopnea.  Musculoskeletal: Negative for myalgias and neck pain.  Neurological: Negative for focal weakness and headaches.       Objective:   Physical Exam  Constitutional: She is oriented to person, place, and time. She appears well-developed and well-nourished.  HENT:  Nose: Nose normal.  Mouth/Throat: Oropharynx is clear and moist. Dental caries present.  Multiple dental caries  Eyes: EOM are normal.  Neck:  Trachea normal, normal range  of motion and full passive range of motion without pain. Neck supple. No JVD present. Carotid bruit is not present. No thyromegaly present.  Cardiovascular: Normal rate, regular rhythm, normal heart sounds and intact distal pulses.  Exam reveals no gallop and no friction rub.   No murmur heard. Pulmonary/Chest: Effort normal. She has wheezes (course wheezes bil bases).  Deep tight cough  Abdominal: Soft. Bowel sounds are normal. She exhibits no distension and no mass. There is no tenderness.  Genitourinary: Uterus normal.  Musculoskeletal: Normal range of motion.  Lymphadenopathy:    She has no cervical adenopathy.  Neurological: She is alert and oriented to person, place, and time. She has normal reflexes.  Skin: Skin is warm and dry.  Psychiatric: She has a normal mood and affect. Her behavior is normal. Judgment and thought content normal.    BP 149/91  Pulse 99  Temp(Src) 98.7 F (37.1 C) (Oral)  Ht 5' 2" (1.575 m)  Wt 193 lb (87.544 kg)  BMI 35.29 kg/m2  Results for orders placed in visit on 06/15/13  POCT GLYCOSYLATED HEMOGLOBIN (HGB A1C)      Result Value Ref Range   Hemoglobin A1C 5.7          Assessment & Plan:   1. DM (diabetes mellitus)   2. Hypertension   3. Obesity   4. COPD bronchitis   5. Tobacco user   6. Hyperlipidemia LDL goal < 100   7. Depression   8. Back pain   9. Urinary incontinence   10. Vitamin D deficiency   11. BMI 35.0-35.9,adult   12. Weight loss counseling, encounter for    Orders Placed This Encounter  Procedures  . CMP14+EGFR  . NMR, lipoprofile  . Vit D  25 hydroxy (rtn osteoporosis monitoring)  . POCT glycosylated hemoglobin (Hb A1C)   Meds ordered this encounter  Medications  . ezetimibe (ZETIA) 10 MG tablet    Sig: Take 1 tablet (10 mg total) by mouth daily.    Dispense:  30 tablet    Refill:  1    ntbs for further refills    Order Specific Question:  Supervising Provider    Answer:  Chipper Herb [[id number]]  . FLUoxetine (PROZAC) 20 MG capsule    Sig: Take 1 capsule (20 mg total) by mouth 2 (two) times daily.    Dispense:  180 capsule    Refill:  5    Order Specific Question:  Supervising Provider    Answer:  Chipper Herb [[id number]]  . amLODipine (NORVASC) 10 MG tablet    Sig: Take 1 tablet (10 mg total) by mouth daily.    Dispense:  30 tablet    Refill:  5    Order Specific Question:  Supervising Provider    Answer:  Chipper Herb [[id number]]  . hydrochlorothiazide (HYDRODIURIL) 25 MG tablet    Sig: Take 1 tablet (25 mg total) by mouth daily.    Dispense:  30 tablet    Refill:  5    Order Specific Question:  Supervising Provider    Answer:  Chipper Herb [[id number]]  . HYDROcodone-acetaminophen (NORCO/VICODIN) 5-325 MG per tablet    Sig: TAKE ONE TABLET BY MOUTH TWICE DAILY AS NEEDED    Dispense:  60 tablet    Refill:  0    Order Specific Question:  Supervising Provider    Answer:  Chipper Herb [[id number]]  . simvastatin (ZOCOR) 40 MG tablet    Sig: Take  1 tablet (40 mg total) by mouth at bedtime.    Dispense:  90 tablet    Refill:  1    Order Specific Question:  Supervising Provider    Answer:  Chipper Herb [[id number]]  . busPIRone (BUSPAR) 7.5 MG tablet    Sig: TAKE ONE TABLET BY MOUTH ONCE DAILY    Dispense:  30 tablet    Refill:  2    Order Specific Question:  Supervising Provider    Answer:  Chipper Herb [[id number]]  . Incontinence Supply Disposable (INCONTINENCE BRIEF LARGE) MISC    Sig: 1 each by Does not apply route as needed.    Dispense:  48 each    Refill:  11    Order Specific Question:  Supervising Provider    Answer:  Chipper Herb [[id number]]  . glucose blood (ONETOUCH VERIO) test strip    Sig: Check blood sugar 1 X fasting per day and prn   Dx 250.02    Dispense:  100 each    Refill:  12    Order Specific Question:  Supervising Provider    Answer:  Chipper Herb [[id number]]  encouraged to get eye exam Refuses immunizations Will schedule mammo and PAP at  her convience Labs pending Health maintenance reviewed Diet and exercise encouraged Continue all meds Follow up  In 3 months   [place], FNP

## 2013-06-16 LAB — CMP14+EGFR
ALBUMIN: 4.4 g/dL (ref 3.5–5.5)
ALT: 22 IU/L (ref 0–32)
AST: 21 IU/L (ref 0–40)
Albumin/Globulin Ratio: 1.9 (ref 1.1–2.5)
Alkaline Phosphatase: 56 IU/L (ref 39–117)
BUN/Creatinine Ratio: 15 (ref 9–23)
BUN: 8 mg/dL (ref 6–24)
CHLORIDE: 99 mmol/L (ref 97–108)
CO2: 24 mmol/L (ref 18–29)
Calcium: 10.1 mg/dL (ref 8.7–10.2)
Creatinine, Ser: 0.55 mg/dL — ABNORMAL LOW (ref 0.57–1.00)
GFR calc non Af Amer: 114 mL/min/{1.73_m2} (ref >59)
GFR, EST AFRICAN AMERICAN: 131 mL/min/{1.73_m2} (ref >59)
GLUCOSE: 84 mg/dL (ref 65–99)
Globulin, Total: 2.3 g/dL (ref 1.5–4.5)
Potassium: 4.3 mmol/L (ref 3.5–5.2)
Sodium: 138 mmol/L (ref 134–144)
TOTAL PROTEIN: 6.7 g/dL (ref 6.0–8.5)
Total Bilirubin: 0.2 mg/dL (ref 0.0–1.2)

## 2013-06-16 LAB — NMR, LIPOPROFILE
CHOLESTEROL: 155 mg/dL (ref <200)
HDL Cholesterol by NMR: 42 mg/dL (ref >=40)
HDL Particle Number: 34.3 umol/L (ref >=30.5)
LDL Particle Number: 1250 nmol/L — ABNORMAL HIGH (ref <1000)
LDL Size: 19.9 nm (ref >20.5)
LDLC SERPL CALC-MCNC: 80 mg/dL (ref <100)
LP-IR Score: 74 — ABNORMAL HIGH (ref <=45)
Small LDL Particle Number: 922 nmol/L — ABNORMAL HIGH (ref <=527)
TRIGLYCERIDES BY NMR: 163 mg/dL — AB (ref <150)

## 2013-06-17 LAB — VITAMIN D 25 HYDROXY (VIT D DEFICIENCY, FRACTURES): VIT D 25 HYDROXY: 29.8 ng/mL — AB (ref 30.0–100.0)

## 2013-06-17 LAB — SPECIMEN STATUS REPORT

## 2013-06-23 ENCOUNTER — Other Ambulatory Visit: Payer: Self-pay

## 2013-06-23 MED ORDER — GLUCOSE BLOOD VI STRP: ORAL_STRIP | Status: DC

## 2013-07-01 ENCOUNTER — Encounter: Payer: Self-pay | Admitting: *Deleted

## 2013-07-08 ENCOUNTER — Telehealth: Payer: Self-pay | Admitting: Nurse Practitioner

## 2013-07-08 DIAGNOSIS — M549 Dorsalgia, unspecified: Secondary | ICD-10-CM

## 2013-07-09 MED ORDER — HYDROCODONE-ACETAMINOPHEN 5-325 MG PO TABS: ORAL_TABLET | ORAL | Status: DC

## 2013-07-09 NOTE — Telephone Encounter (Signed)
rx ready for pick up If is going to need on an ongoing basis will need to see pain management

## 2013-07-15 ENCOUNTER — Encounter: Payer: Medicaid Other | Admitting: Physical Medicine & Rehabilitation

## 2013-08-08 ENCOUNTER — Telehealth: Payer: Self-pay | Admitting: Nurse Practitioner

## 2013-08-08 DIAGNOSIS — M549 Dorsalgia, unspecified: Secondary | ICD-10-CM

## 2013-08-10 MED ORDER — HYDROCODONE-ACETAMINOPHEN 5-325 MG PO TABS: ORAL_TABLET | ORAL | Status: DC

## 2013-08-10 NOTE — Telephone Encounter (Signed)
Patient aware rx is ready to pick up.

## 2013-08-10 NOTE — Telephone Encounter (Signed)
rx ready for pick up- needs referral to pain clinic if going to remain on pain meds

## 2013-08-26 ENCOUNTER — Telehealth: Payer: Self-pay | Admitting: Family Medicine

## 2013-08-26 NOTE — Telephone Encounter (Signed)
Left message to make diabetes initiative appointment

## 2013-09-04 ENCOUNTER — Telehealth: Payer: Self-pay | Admitting: Nurse Practitioner

## 2013-09-04 DIAGNOSIS — F329 Major depressive disorder, single episode, unspecified: Secondary | ICD-10-CM

## 2013-09-04 DIAGNOSIS — M545 Low back pain, unspecified: Secondary | ICD-10-CM

## 2013-09-04 DIAGNOSIS — F32A Depression, unspecified: Secondary | ICD-10-CM

## 2013-09-05 MED ORDER — HYDROCODONE-ACETAMINOPHEN 5-325 MG PO TABS: ORAL_TABLET | ORAL | Status: DC

## 2013-09-05 MED ORDER — BUSPIRONE HCL 7.5 MG PO TABS: ORAL_TABLET | ORAL | Status: DC

## 2013-09-05 NOTE — Telephone Encounter (Signed)
rx ready for pickup 

## 2013-09-07 NOTE — Telephone Encounter (Signed)
In lock box for patient to pick up when ready

## 2013-09-15 ENCOUNTER — Encounter: Payer: Medicaid Other | Admitting: Physical Medicine & Rehabilitation

## 2013-09-21 ENCOUNTER — Other Ambulatory Visit: Payer: Self-pay | Admitting: Nurse Practitioner

## 2013-09-21 ENCOUNTER — Telehealth: Payer: Self-pay | Admitting: Family Medicine

## 2013-09-21 MED ORDER — SIMVASTATIN 10 MG PO TABS: 10.0000 mg | ORAL_TABLET | Freq: Every day | ORAL | Status: DC

## 2013-09-21 NOTE — Telephone Encounter (Signed)
Yes that is fine- I will send in new rx- just let patient know

## 2013-09-21 NOTE — Telephone Encounter (Signed)
Interaction with amlodipine and simvastatin can we lower the simvastatin to 10 mg?

## 2013-10-05 ENCOUNTER — Telehealth: Payer: Self-pay | Admitting: Nurse Practitioner

## 2013-10-05 DIAGNOSIS — M545 Low back pain, unspecified: Secondary | ICD-10-CM

## 2013-10-05 MED ORDER — HYDROCODONE-ACETAMINOPHEN 5-325 MG PO TABS: ORAL_TABLET | ORAL | Status: DC

## 2013-10-05 NOTE — Telephone Encounter (Signed)
rx ready for pickup 

## 2013-10-06 NOTE — Telephone Encounter (Signed)
Patient aware to pick up Rx  

## 2013-10-21 ENCOUNTER — Other Ambulatory Visit: Payer: Self-pay | Admitting: Nurse Practitioner

## 2013-11-03 ENCOUNTER — Telehealth: Payer: Self-pay | Admitting: Nurse Practitioner

## 2013-11-03 DIAGNOSIS — M545 Low back pain, unspecified: Secondary | ICD-10-CM

## 2013-11-03 DIAGNOSIS — F32A Depression, unspecified: Secondary | ICD-10-CM

## 2013-11-03 DIAGNOSIS — F329 Major depressive disorder, single episode, unspecified: Secondary | ICD-10-CM

## 2013-11-03 MED ORDER — HYDROCODONE-ACETAMINOPHEN 5-325 MG PO TABS: ORAL_TABLET | ORAL | Status: DC

## 2013-11-03 MED ORDER — BUSPIRONE HCL 7.5 MG PO TABS: ORAL_TABLET | ORAL | Status: DC

## 2013-11-03 NOTE — Telephone Encounter (Signed)
rx ready for pickup 

## 2013-11-03 NOTE — Telephone Encounter (Signed)
Detailed message left that rx is up front to be picked up. 

## 2013-11-09 ENCOUNTER — Encounter: Payer: Medicaid Other | Admitting: Physical Medicine & Rehabilitation

## 2013-11-20 ENCOUNTER — Other Ambulatory Visit: Payer: Self-pay | Admitting: Nurse Practitioner

## 2013-11-20 ENCOUNTER — Telehealth: Payer: Self-pay | Admitting: Nurse Practitioner

## 2013-11-20 DIAGNOSIS — E119 Type 2 diabetes mellitus without complications: Secondary | ICD-10-CM

## 2013-11-20 MED ORDER — EZETIMIBE 10 MG PO TABS: 10.0000 mg | ORAL_TABLET | Freq: Every day | ORAL | Status: DC

## 2013-11-20 MED ORDER — METFORMIN HCL 500 MG PO TABS: 500.0000 mg | ORAL_TABLET | Freq: Every day | ORAL | Status: DC

## 2013-11-20 NOTE — Telephone Encounter (Signed)
done

## 2013-11-26 ENCOUNTER — Other Ambulatory Visit: Payer: Self-pay | Admitting: Nurse Practitioner

## 2013-12-01 ENCOUNTER — Telehealth: Payer: Self-pay | Admitting: Nurse Practitioner

## 2013-12-01 DIAGNOSIS — M545 Low back pain, unspecified: Secondary | ICD-10-CM

## 2013-12-01 MED ORDER — HYDROCODONE-ACETAMINOPHEN 5-325 MG PO TABS: ORAL_TABLET | ORAL | Status: DC

## 2013-12-01 NOTE — Telephone Encounter (Signed)
rx ready for pick up no more refills without being seen  

## 2013-12-01 NOTE — Telephone Encounter (Signed)
Patient aware to pick up and make an appointment to be seen

## 2013-12-04 ENCOUNTER — Telehealth: Payer: Self-pay | Admitting: Family Medicine

## 2013-12-11 ENCOUNTER — Encounter (HOSPITAL_COMMUNITY): Payer: Self-pay | Admitting: Emergency Medicine

## 2013-12-11 ENCOUNTER — Ambulatory Visit (HOSPITAL_COMMUNITY): Payer: Medicaid Other | Attending: Diagnostic Radiology

## 2013-12-11 ENCOUNTER — Ambulatory Visit (INDEPENDENT_AMBULATORY_CARE_PROVIDER_SITE_OTHER): Payer: Medicaid Other | Admitting: Nurse Practitioner

## 2013-12-11 ENCOUNTER — Emergency Department (HOSPITAL_COMMUNITY)
Admission: EM | Admit: 2013-12-11 | Discharge: 2013-12-11 | Disposition: A | Discharge status: Home / Self Care | Payer: Medicaid Other | Attending: Family Medicine | Admitting: Family Medicine

## 2013-12-11 ENCOUNTER — Ambulatory Visit (HOSPITAL_COMMUNITY)
Admit: 2013-12-11 | Discharge: 2013-12-11 | Disposition: A | Discharge status: Home / Self Care | Payer: Medicaid Other | Attending: Family Medicine | Admitting: Family Medicine

## 2013-12-11 ENCOUNTER — Encounter: Payer: Self-pay | Admitting: Nurse Practitioner

## 2013-12-11 ENCOUNTER — Ambulatory Visit (INDEPENDENT_AMBULATORY_CARE_PROVIDER_SITE_OTHER): Payer: Medicaid Other

## 2013-12-11 VITALS — BP 153/90 | HR 106 | Temp 97.3°F | Ht 62.0 in | Wt 198.0 lb

## 2013-12-11 DIAGNOSIS — R1031 Right lower quadrant pain: Secondary | ICD-10-CM | POA: Insufficient documentation

## 2013-12-11 DIAGNOSIS — I708 Atherosclerosis of other arteries: Secondary | ICD-10-CM | POA: Insufficient documentation

## 2013-12-11 DIAGNOSIS — D72829 Elevated white blood cell count, unspecified: Secondary | ICD-10-CM

## 2013-12-11 DIAGNOSIS — R1084 Generalized abdominal pain: Secondary | ICD-10-CM

## 2013-12-11 LAB — POCT URINALYSIS DIPSTICK
BILIRUBIN UA: NEGATIVE
Glucose, UA: NEGATIVE
KETONES UA: NEGATIVE
LEUKOCYTES UA: NEGATIVE
Nitrite, UA: NEGATIVE
Protein, UA: NEGATIVE
Spec Grav, UA: 1.01
Urobilinogen, UA: NEGATIVE
pH, UA: 6.5

## 2013-12-11 LAB — POCT UA - MICROSCOPIC ONLY
BACTERIA, U MICROSCOPIC: NEGATIVE
CASTS, UR, LPF, POC: NEGATIVE
Crystals, Ur, HPF, POC: NEGATIVE
MUCUS UA: NEGATIVE
WBC, Ur, HPF, POC: NEGATIVE
Yeast, UA: NEGATIVE

## 2013-12-11 LAB — POCT CBC
Granulocyte percent: 67.2 %G (ref 37–80)
HCT, POC: 42.2 % (ref 37.7–47.9)
HEMOGLOBIN: 13.9 g/dL (ref 12.2–16.2)
Lymph, poc: 5.3 — AB (ref 0.6–3.4)
MCH: 28.1 pg (ref 27–31.2)
MCHC: 32.9 g/dL (ref 31.8–35.4)
MCV: 85.5 fL (ref 80–97)
MPV: 7.3 fL (ref 0–99.8)
POC GRANULOCYTE: 12.4 — AB (ref 2–6.9)
POC LYMPH %: 28.7 % (ref 10–50)
Platelet Count, POC: 471 10*3/uL — AB (ref 142–424)
RBC: 4.9 M/uL (ref 4.04–5.48)
RDW, POC: 13.1 %
WBC: 18.4 10*3/uL — AB (ref 4.6–10.2)

## 2013-12-11 MED ORDER — IOHEXOL 300 MG/ML  SOLN
100.0000 mL | Freq: Once | INTRAMUSCULAR | Status: AC | PRN
  Administered 2013-12-11: 100 mL via INTRAVENOUS

## 2013-12-11 NOTE — ED Notes (Addendum)
Pt reports RLQ pain x1 week. Pt reports minimal nausea. Pt denies v/d. nad noted.pt sent from doctors office, pt reports intermittent abdominal pain. Xray reported that pt already has order for CT from pt pcp but no appointment time.

## 2013-12-11 NOTE — Patient Instructions (Signed)

## 2013-12-11 NOTE — Progress Notes (Signed)
Subjective:    Patient ID: Madison Gentry, female    DOB: Apr 15, 1965, 48 y.o.   MRN: [id number]  Abdominal Pain This is a new problem. The current episode started in the past 7 days. The onset quality is sudden. The problem occurs intermittently. The problem has been unchanged. The pain is located in the RUQ, RLQ and periumbilical region. The pain is at a severity of 8/10. The pain is moderate. The quality of the pain is cramping. The abdominal pain radiates to the pelvis. Associated symptoms include constipation (no big bowel movement in awhile), dysuria and frequency. Pertinent negatives include no belching, diarrhea, nausea, vomiting or weight loss. The pain is aggravated by eating. The pain is relieved by recumbency. She has tried oral narcotic analgesics for the symptoms. The treatment provided mild relief.      Review of Systems  Constitutional: Negative.  Negative for weight loss.  HENT: Negative.   Gastrointestinal: Positive for abdominal pain and constipation (no big bowel movement in awhile). Negative for nausea, vomiting and diarrhea.  Genitourinary: Positive for dysuria, urgency and frequency.  Neurological: Negative.   Psychiatric/Behavioral: Negative.   All other systems reviewed and are negative.      Objective:   Physical Exam  Constitutional: She is oriented to person, place, and time. She appears well-developed and well-nourished. No distress.  Cardiovascular: Normal rate, regular rhythm and normal heart sounds.   Pulmonary/Chest: Effort normal and breath sounds normal.  Abdominal: Soft.  Neurological: She is alert and oriented to person, place, and time.  Skin: Skin is warm and dry.  Psychiatric: She has a normal mood and affect. Her behavior is normal. Judgment and thought content normal.    BP 153/90  Pulse 106  Temp(Src) 97.3 F (36.3 C) (Oral)  Ht 5\' 2"  (1.575 m)  Wt 198 lb (89.812 kg)  BMI 36.21 kg/m2  LMP 11/23/2013  Results for orders placed in visit  on 12/11/13  POCT CBC      Result Value Ref Range   WBC 18.4 (*) 4.6 - 10.2 K/uL   Lymph, poc 5.3 (*) 0.6 - 3.4   POC LYMPH PERCENT 28.7  10 - 50 %L   POC Granulocyte 12.4 (*) 2 - 6.9   Granulocyte percent 67.2  37 - 80 %G   RBC 4.9  4.04 - 5.48 M/uL   Hemoglobin 13.9  12.2 - 16.2 g/dL   HCT, POC 82.942.2  56.237.7 - 47.9 %   MCV 85.5  80 - 97 fL   MCH, POC 28.1  27 - 31.2 pg   MCHC 32.9  31.8 - 35.4 g/dL   RDW, POC 13.013.1     Platelet Count, POC 471.0 (*) 142 - 424 K/uL   MPV 7.3  0 - 99.8 fL   KUB- moderate stool burden-Preliminary reading by Paulene FloorMary Cortni Tays, FNP  [facility]      Assessment & Plan:   1. Right lower quadrant abdominal pain   2. Generalized abdominal pain   3. Elevated WBC count    Orders Placed This Encounter  Procedures  . DG Abd 1 View    Standing Status: Future     Number of Occurrences: 1     Standing Expiration Date: 02/10/2015    Order Specific Question:  Reason for Exam (SYMPTOM  OR DIAGNOSIS REQUIRED)    Answer:  abd pain    Order Specific Question:  Is the patient pregnant?    Answer:  No    Order Specific Question:  Preferred imaging location?    Answer:  Internal  . CT Abdomen Pelvis W Contrast    Standing Status: Future     Number of Occurrences:      Standing Expiration Date: 03/14/2015    Order Specific Question:  Reason for Exam (SYMPTOM  OR DIAGNOSIS REQUIRED)    Answer:  adb pain with Elevated WBC    Order Specific Question:  Is the patient pregnant?    Answer:  No    Order Specific Question:  Preferred imaging location?    Answer:  [facility]  . POCT urinalysis dipstick  . POCT UA - Microscopic Only  . POCT CBC   NPO until test complete Will call with CT results  Mary-Margaret Daphine DeutscherMartin, FNP

## 2013-12-14 ENCOUNTER — Telehealth: Payer: Self-pay | Admitting: Nurse Practitioner

## 2013-12-14 DIAGNOSIS — D72829 Elevated white blood cell count, unspecified: Secondary | ICD-10-CM

## 2013-12-14 NOTE — Telephone Encounter (Signed)
Message copied by Almeta MonasSTONE, Mahayla Haddaway M on Mon Dec 14, 2013  3:39 PM ------      Message from: Bennie PieriniMARTIN, MARY-MARGARET      Created: Sun Dec 13, 2013  2:57 PM       CT negative- need to come in to repeat CBC ------

## 2013-12-14 NOTE — Telephone Encounter (Signed)
Need to repeat CBC, US was normal

## 2013-12-15 ENCOUNTER — Other Ambulatory Visit: Payer: Self-pay | Admitting: Nurse Practitioner

## 2013-12-15 ENCOUNTER — Other Ambulatory Visit: Payer: Medicaid Other

## 2013-12-15 ENCOUNTER — Other Ambulatory Visit: Payer: Self-pay | Admitting: Family Medicine

## 2013-12-15 DIAGNOSIS — D72829 Elevated white blood cell count, unspecified: Secondary | ICD-10-CM

## 2013-12-15 LAB — POCT CBC
Granulocyte percent: 79.1 % (ref 37–80)
HCT, POC: 39.1 % (ref 37.7–47.9)
Hemoglobin: 12.8 g/dL (ref 12.2–16.2)
Lymph, poc: 3.1 (ref 0.6–3.4)
MCH, POC: 27.8 pg (ref 27–31.2)
MCHC: 32.8 g/dL (ref 31.8–35.4)
MCV: 84.8 fL (ref 80–97)
MPV: 7.2 fL (ref 0–99.8)
POC Granulocyte: 13.5 — AB (ref 2–6.9)
POC LYMPH PERCENT: 18.1 % (ref 10–50)
Platelet Count, POC: 420 10*3/uL (ref 142–424)
RBC: 4.6 M/uL (ref 4.04–5.48)
RDW, POC: 13.1 %
WBC: 17.1 10*3/uL — AB (ref 4.6–10.2)

## 2013-12-15 MED ORDER — CIPROFLOXACIN HCL 500 MG PO TABS: 500.0000 mg | ORAL_TABLET | Freq: Two times a day (BID) | ORAL | Status: DC

## 2013-12-17 ENCOUNTER — Telehealth: Payer: Self-pay | Admitting: Nurse Practitioner

## 2013-12-17 NOTE — Telephone Encounter (Signed)
Appointment given for tomorrow with Madison Gentry 

## 2013-12-18 ENCOUNTER — Other Ambulatory Visit: Payer: Self-pay | Admitting: Nurse Practitioner

## 2013-12-18 ENCOUNTER — Telehealth: Payer: Self-pay | Admitting: Nurse Practitioner

## 2013-12-18 ENCOUNTER — Encounter: Payer: Self-pay | Admitting: Nurse Practitioner

## 2013-12-18 ENCOUNTER — Ambulatory Visit (INDEPENDENT_AMBULATORY_CARE_PROVIDER_SITE_OTHER): Payer: Medicaid Other | Admitting: Nurse Practitioner

## 2013-12-18 VITALS — BP 148/93 | HR 101 | Temp 97.5°F | Ht 62.0 in | Wt 199.0 lb

## 2013-12-18 DIAGNOSIS — R059 Cough, unspecified: Secondary | ICD-10-CM

## 2013-12-18 DIAGNOSIS — R05 Cough: Secondary | ICD-10-CM

## 2013-12-18 DIAGNOSIS — D72829 Elevated white blood cell count, unspecified: Secondary | ICD-10-CM

## 2013-12-18 LAB — POCT CBC
Granulocyte percent: 75.6 %G (ref 37–80)
HCT, POC: 40.6 % (ref 37.7–47.9)
HEMOGLOBIN: 13.5 g/dL (ref 12.2–16.2)
LYMPH, POC: 3.5 — AB (ref 0.6–3.4)
MCH, POC: 28 pg (ref 27–31.2)
MCHC: 33.2 g/dL (ref 31.8–35.4)
MCV: 84.5 fL (ref 80–97)
MPV: 7.2 fL (ref 0–99.8)
POC Granulocyte: 12.9 — AB (ref 2–6.9)
POC LYMPH %: 20.7 % (ref 10–50)
Platelet Count, POC: 487 10*3/uL — AB (ref 142–424)
RBC: 4.8 M/uL (ref 4.04–5.48)
RDW, POC: 13 %
WBC: 17 10*3/uL — AB (ref 4.6–10.2)

## 2013-12-18 MED ORDER — HYDROCODONE-HOMATROPINE 5-1.5 MG/5ML PO SYRP: 5.0000 mL | ORAL_SOLUTION | Freq: Three times a day (TID) | ORAL | Status: DC | PRN

## 2013-12-18 MED ORDER — METHYLPREDNISOLONE ACETATE 80 MG/ML IJ SUSP
80.0000 mg | Freq: Once | INTRAMUSCULAR | Status: AC
  Administered 2013-12-18: 80 mg via INTRAMUSCULAR

## 2013-12-18 MED ORDER — ACE TENNIS ELBOW STRAP MISC: 1.0000 | Freq: Every day | Status: DC

## 2013-12-18 NOTE — Patient Instructions (Signed)
White Blood Cell Count Test The white blood cell (WBC) count indicates the number of white blood cells in a sample of blood. This count provides a clue to the presence of illness. White blood cells are made in the bone marrow and protect the body against infection and aid in the immune response. If an infection develops, white blood cells attack and destroy the bacteria, fungus, or virus causing the infection. Conditions or medications that weaken the immune system, such as HIV infection or chemotherapy, cause a decrease in white blood cells. The WBC count detects dangerously low numbers of these cells. The WBC count is also used to help monitor the body's response to various treatments and to monitor bone marrow function.  PREPARATION FOR TEST A blood sample is obtained by inserting a needle into a vein in the arm. NORMAL FINDINGS Total WBCs  Adult/Child greater than 2 years: 5000-10,000/mm3 or 5-10 x 109/L (SI units)  Child less than 2 years: 6200-17,000/mm3  Newborn: 9000-30,000/mm3 Differential Count / Absolute  Neutrophils 55-70% / 2500-8000 per mm3  Lymphocytes 20-40% / 1000-4000 per mm3  Monocytes 2-8% / 100-700 per mm3  Eosinophils 1-4% / 50-500 per mm3  Basophils 0.5-1.0% /25-100 per mm3 Ranges for normal findings may vary among different laboratories and hospitals. You should always check with your doctor after having lab work or other tests done to discuss the meaning of your test results and whether your values are considered within normal limits. MEANING OF TEST  Your caregiver will go over the test results with you and discuss the importance and meaning of your results, as well as treatment options and the need for additional tests if necessary. OBTAINING THE TEST RESULTS It is your responsibility to obtain your test results. Ask the lab or department performing the test when and how you will get your results. Document Released: 03/09/2004 Document Revised: 05/07/2011  Document Reviewed: 05/26/2013 [employer] Patient Information 2015 [employer]. This information is not intended to replace advice given to you by your health care provider. Make sure you discuss any questions you have with your health care provider.  

## 2013-12-18 NOTE — Telephone Encounter (Signed)
[facility]

## 2013-12-18 NOTE — Progress Notes (Signed)
Subjective:    Patient ID: Nonnie DoneJill Pizzini, female    DOB: 01-01-1966, 48 y.o.   MRN: [id number]  HPI Patient was seen 12/11/13 with right lower abd pain and elevated WBC- she was sent to hospital for abd CT wi=hich was negative- SHe came in 2 days later to recheck WBC and they were still elevated- We gave her a rx for cipro- She is feeling some better but has developed a cough. The cough is keeping her up at night.    Review of Systems  Constitutional: Negative for fever, chills and appetite change.  HENT: Positive for congestion. Negative for rhinorrhea.   Respiratory: Positive for cough (productive).   Cardiovascular: Negative.   Gastrointestinal: Positive for nausea (on occasion).  Genitourinary: Negative.   Neurological: Negative.   Psychiatric/Behavioral: Negative.        Objective:   Physical Exam  Constitutional: She is oriented to person, place, and time. She appears well-developed and well-nourished.  HENT:  Right Ear: Tympanic membrane, external ear and ear canal normal.  Left Ear: Hearing, tympanic membrane, external ear and ear canal normal.  Nose: Mucosal edema and rhinorrhea present. Right sinus exhibits no maxillary sinus tenderness and no frontal sinus tenderness. Left sinus exhibits no maxillary sinus tenderness and no frontal sinus tenderness.  Mouth/Throat: Uvula is midline, oropharynx is clear and moist and mucous membranes are normal.  Eyes: Pupils are equal, round, and reactive to light.  Neck: Normal range of motion. Neck supple.  Cardiovascular: Normal rate, regular rhythm and normal heart sounds.   Pulmonary/Chest: Effort normal and breath sounds normal. No respiratory distress. She has no wheezes. She has no rales.  Dry hacky cough  Abdominal: Soft. Bowel sounds are normal. There is tenderness (Mild R:LQ pain on deep palpation).  Neurological: She is alert and oriented to person, place, and time.  Skin: Skin is warm and dry.  Psychiatric: She has a normal  mood and affect. Her behavior is normal. Judgment and thought content normal.   BP 148/93  Pulse 101  Temp(Src) 97.5 F (36.4 C) (Oral)  Ht 5\' 2"  (1.575 m)  Wt 199 lb (90.266 kg)  BMI 36.39 kg/m2  LMP 11/23/2013  Results for orders placed in visit on 12/18/13  POCT CBC      Result Value Ref Range   WBC 17.0 (*) 4.6 - 10.2 K/uL   Lymph, poc 3.5 (*) 0.6 - 3.4   POC LYMPH PERCENT 20.7  10 - 50 %L   MID (cbc)    0 - 0.9   POC MID %    0 - 12 %M   POC Granulocyte 12.9 (*) 2 - 6.9   Granulocyte percent 75.6  37 - 80 %G   RBC 4.8  4.04 - 5.48 M/uL   Hemoglobin 13.5  12.2 - 16.2 g/dL   HCT, POC 40.940.6  81.137.7 - 47.9 %   MCV 84.5  80 - 97 fL   MCH, POC 28.0  27 - 31.2 pg   MCHC 33.2  31.8 - 35.4 g/dL   RDW, POC 91.413.0     Platelet Count, POC 487.0 (*) 142 - 424 K/uL   MPV 7.2  0 - 99.8 fL          Assessment & Plan:   1. Elevated WBC count Repeat on MOnday - if still elevated will seem to hematology - POCT CBC  2. Cough focre fluids rest - methylPREDNISolone acetate (DEPO-MEDROL) injection 80 mg; Inject 1 mL (80 mg total)  into the muscle once. - hycodan 1 tsp po q8 prn cough 120ml   Mary-Margaret Daphine DeutscherMartin, FNP

## 2013-12-21 ENCOUNTER — Telehealth: Payer: Self-pay | Admitting: Nurse Practitioner

## 2013-12-21 ENCOUNTER — Other Ambulatory Visit (INDEPENDENT_AMBULATORY_CARE_PROVIDER_SITE_OTHER): Payer: Medicaid Other

## 2013-12-21 DIAGNOSIS — R799 Abnormal finding of blood chemistry, unspecified: Secondary | ICD-10-CM

## 2013-12-21 LAB — POCT CBC
Granulocyte percent: 75.4 %G (ref 37–80)
HCT, POC: 41 % (ref 37.7–47.9)
Hemoglobin: 12.9 g/dL (ref 12.2–16.2)
Lymph, poc: 3.3 (ref 0.6–3.4)
MCH, POC: 26.7 pg — AB (ref 27–31.2)
MCHC: 31.6 g/dL — AB (ref 31.8–35.4)
MCV: 84.4 fL (ref 80–97)
MPV: 7.3 fL (ref 0–99.8)
PLATELET COUNT, POC: 526 10*3/uL — AB (ref 142–424)
POC GRANULOCYTE: 12.3 — AB (ref 2–6.9)
POC LYMPH PERCENT: 20.5 %L (ref 10–50)
RBC: 4.9 M/uL (ref 4.04–5.48)
RDW, POC: 13.3 %
WBC: 16.3 10*3/uL — AB (ref 4.6–10.2)

## 2013-12-21 NOTE — Telephone Encounter (Signed)
Continuous busy signals.

## 2013-12-21 NOTE — Addendum Note (Signed)
Addended by: Tommas OlpHANDY, ASHLEY N on: 12/21/2013 12:21 PM   Modules accepted: Orders

## 2013-12-21 NOTE — Progress Notes (Signed)
Lab only 

## 2013-12-22 MED ORDER — SIMVASTATIN 40 MG PO TABS: 40.0000 mg | ORAL_TABLET | Freq: Every day | ORAL | Status: DC

## 2013-12-22 NOTE — Telephone Encounter (Signed)
Lets recheck labs Monday

## 2013-12-22 NOTE — Telephone Encounter (Signed)
Chart says that she is on simvaststin 10 mg - call her pharmacy and see what she has gotten in the past

## 2013-12-22 NOTE — Telephone Encounter (Signed)
Has been getting 40 mg and also wants to know what you are going to do about white blood cell count

## 2013-12-22 NOTE — Telephone Encounter (Signed)
Taken care of

## 2013-12-22 NOTE — Telephone Encounter (Signed)
What about the cholesterol med

## 2013-12-23 ENCOUNTER — Telehealth: Payer: Self-pay | Admitting: Nurse Practitioner

## 2013-12-23 ENCOUNTER — Telehealth: Payer: Self-pay | Admitting: Family Medicine

## 2013-12-23 NOTE — Telephone Encounter (Signed)
Please send in the right dose of lipitor which is 40 mg to [facility]. Also she needs an RX sent to [facility] for the compression brace you were talking about.

## 2013-12-24 ENCOUNTER — Other Ambulatory Visit: Payer: Self-pay | Admitting: Nurse Practitioner

## 2013-12-24 DIAGNOSIS — M7711 Lateral epicondylitis, right elbow: Secondary | ICD-10-CM

## 2013-12-24 MED ORDER — TENNIS ELBOW STRAP MISC: 1.0000 | Freq: Every day | Status: DC

## 2013-12-24 NOTE — Telephone Encounter (Signed)
The cholesterol med has been fixed she was talking about the brace for her wrist

## 2013-12-24 NOTE — Telephone Encounter (Signed)
You said when you called about cholesterol meds that you were on 40mg  not 10 mg- the chart stated were on simvastatin 10mg  so i changed to simvaststin 40mg . What are you taking- lipitor or simvastatin? I am not sure what compression device you are speaking of- compression Hose?  I have tried to call several times and also called daughters number.

## 2013-12-24 NOTE — Telephone Encounter (Signed)
This was called in last night.  

## 2013-12-25 ENCOUNTER — Telehealth: Payer: Self-pay | Admitting: Nurse Practitioner

## 2013-12-25 NOTE — Telephone Encounter (Signed)
rx was sent [facility] on 12/24/13- look under meds- order is there

## 2013-12-26 NOTE — Telephone Encounter (Signed)
Patient aware that rx was sent to [facility]

## 2013-12-28 ENCOUNTER — Other Ambulatory Visit (INDEPENDENT_AMBULATORY_CARE_PROVIDER_SITE_OTHER): Payer: Medicaid Other

## 2013-12-28 ENCOUNTER — Other Ambulatory Visit: Payer: Self-pay | Admitting: Nurse Practitioner

## 2013-12-28 DIAGNOSIS — R7989 Other specified abnormal findings of blood chemistry: Secondary | ICD-10-CM

## 2013-12-28 DIAGNOSIS — D72829 Elevated white blood cell count, unspecified: Secondary | ICD-10-CM

## 2013-12-28 DIAGNOSIS — R799 Abnormal finding of blood chemistry, unspecified: Secondary | ICD-10-CM

## 2013-12-28 LAB — POCT CBC
Granulocyte percent: 70.3 %G (ref 37–80)
HEMATOCRIT: 39.5 % (ref 37.7–47.9)
Hemoglobin: 13.6 g/dL (ref 12.2–16.2)
Lymph, poc: 3.3 (ref 0.6–3.4)
MCH: 28.7 pg (ref 27–31.2)
MCHC: 34.3 g/dL (ref 31.8–35.4)
MCV: 83.7 fL (ref 80–97)
MPV: 7.3 fL (ref 0–99.8)
POC GRANULOCYTE: 9.8 — AB (ref 2–6.9)
POC LYMPH %: 23.7 % (ref 10–50)
Platelet Count, POC: 480 10*3/uL — AB (ref 142–424)
RBC: 4.7 M/uL (ref 4.04–5.48)
RDW, POC: 13.1 %
WBC: 13.9 10*3/uL — AB (ref 4.6–10.2)

## 2013-12-28 NOTE — Progress Notes (Signed)
Lab only 

## 2013-12-30 ENCOUNTER — Telehealth: Payer: Self-pay | Admitting: *Deleted

## 2013-12-30 ENCOUNTER — Encounter: Payer: Medicaid Other | Attending: Physical Medicine & Rehabilitation | Admitting: Physical Medicine & Rehabilitation

## 2013-12-31 ENCOUNTER — Telehealth: Payer: Self-pay | Admitting: Nurse Practitioner

## 2013-12-31 DIAGNOSIS — M545 Low back pain, unspecified: Secondary | ICD-10-CM

## 2013-12-31 MED ORDER — HYDROCODONE-ACETAMINOPHEN 5-325 MG PO TABS: ORAL_TABLET | ORAL | Status: DC

## 2013-12-31 NOTE — Telephone Encounter (Signed)
done

## 2013-12-31 NOTE — Telephone Encounter (Signed)
Left message on patients voicemail, prescription is ready for pickup

## 2013-12-31 NOTE — Telephone Encounter (Signed)
rx ready for pickup 

## 2014-01-05 ENCOUNTER — Other Ambulatory Visit: Payer: Self-pay | Admitting: Family Medicine

## 2014-01-05 DIAGNOSIS — R109 Unspecified abdominal pain: Secondary | ICD-10-CM

## 2014-01-05 DIAGNOSIS — D72829 Elevated white blood cell count, unspecified: Secondary | ICD-10-CM

## 2014-01-12 ENCOUNTER — Telehealth: Payer: Self-pay | Admitting: Nurse Practitioner

## 2014-01-14 ENCOUNTER — Encounter (HOSPITAL_COMMUNITY): Payer: Self-pay

## 2014-01-14 ENCOUNTER — Telehealth (HOSPITAL_COMMUNITY): Payer: Self-pay

## 2014-01-14 ENCOUNTER — Ambulatory Visit (HOSPITAL_COMMUNITY)
Admission: RE | Admit: 2014-01-14 | Discharge: 2014-01-14 | Disposition: A | Discharge status: Home / Self Care | Payer: Medicaid Other | Source: Ambulatory Visit | Attending: Hematology and Oncology | Admitting: Hematology and Oncology

## 2014-01-14 ENCOUNTER — Encounter (HOSPITAL_COMMUNITY): Payer: Medicaid Other | Attending: Hematology and Oncology

## 2014-01-14 VITALS — BP 155/65 | HR 108 | Temp 98.8°F | Resp 20 | Ht 62.5 in | Wt 193.1 lb

## 2014-01-14 DIAGNOSIS — M5441 Lumbago with sciatica, right side: Secondary | ICD-10-CM

## 2014-01-14 DIAGNOSIS — I10 Essential (primary) hypertension: Secondary | ICD-10-CM | POA: Insufficient documentation

## 2014-01-14 DIAGNOSIS — M25551 Pain in right hip: Secondary | ICD-10-CM | POA: Diagnosis present

## 2014-01-14 DIAGNOSIS — I2510 Atherosclerotic heart disease of native coronary artery without angina pectoris: Secondary | ICD-10-CM | POA: Diagnosis not present

## 2014-01-14 DIAGNOSIS — R61 Generalized hyperhidrosis: Secondary | ICD-10-CM

## 2014-01-14 DIAGNOSIS — E119 Type 2 diabetes mellitus without complications: Secondary | ICD-10-CM | POA: Diagnosis not present

## 2014-01-14 DIAGNOSIS — Z87891 Personal history of nicotine dependence: Secondary | ICD-10-CM | POA: Insufficient documentation

## 2014-01-14 DIAGNOSIS — R05 Cough: Secondary | ICD-10-CM

## 2014-01-14 DIAGNOSIS — J449 Chronic obstructive pulmonary disease, unspecified: Secondary | ICD-10-CM | POA: Insufficient documentation

## 2014-01-14 DIAGNOSIS — D72829 Elevated white blood cell count, unspecified: Secondary | ICD-10-CM | POA: Diagnosis present

## 2014-01-14 DIAGNOSIS — D7589 Other specified diseases of blood and blood-forming organs: Secondary | ICD-10-CM

## 2014-01-14 DIAGNOSIS — M549 Dorsalgia, unspecified: Secondary | ICD-10-CM | POA: Diagnosis not present

## 2014-01-14 DIAGNOSIS — R053 Chronic cough: Secondary | ICD-10-CM

## 2014-01-14 LAB — COMPREHENSIVE METABOLIC PANEL
ALT: 19 U/L (ref 0–35)
AST: 17 U/L (ref 0–37)
Albumin: 3.7 g/dL (ref 3.5–5.2)
Alkaline Phosphatase: 64 U/L (ref 39–117)
Anion gap: 13 (ref 5–15)
BUN: 11 mg/dL (ref 6–23)
CALCIUM: 9.9 mg/dL (ref 8.4–10.5)
CO2: 29 meq/L (ref 19–32)
CREATININE: 0.5 mg/dL (ref 0.50–1.10)
Chloride: 98 mEq/L (ref 96–112)
GFR calc Af Amer: >90 mL/min (ref >90)
Glucose, Bld: 160 mg/dL — ABNORMAL HIGH (ref 70–99)
Potassium: 2.9 mEq/L — CL (ref 3.7–5.3)
SODIUM: 140 meq/L (ref 137–147)
TOTAL PROTEIN: 7.2 g/dL (ref 6.0–8.3)
Total Bilirubin: <0.2 mg/dL — ABNORMAL LOW (ref 0.3–1.2)

## 2014-01-14 LAB — CBC WITH DIFFERENTIAL/PLATELET
BASOS ABS: 0.1 10*3/uL (ref 0.0–0.1)
Basophils Relative: 1 % (ref 0–1)
EOS ABS: 0.4 10*3/uL (ref 0.0–0.7)
EOS PCT: 3 % (ref 0–5)
HCT: 39.4 % (ref 36.0–46.0)
Hemoglobin: 13.5 g/dL (ref 12.0–15.0)
Lymphocytes Relative: 25 % (ref 12–46)
Lymphs Abs: 3 10*3/uL (ref 0.7–4.0)
MCH: 29 pg (ref 26.0–34.0)
MCHC: 34.3 g/dL (ref 30.0–36.0)
MCV: 84.5 fL (ref 78.0–100.0)
MONO ABS: 0.6 10*3/uL (ref 0.1–1.0)
Monocytes Relative: 5 % (ref 3–12)
Neutro Abs: 7.8 10*3/uL — ABNORMAL HIGH (ref 1.7–7.7)
Neutrophils Relative %: 66 % (ref 43–77)
Platelets: 414 10*3/uL — ABNORMAL HIGH (ref 150–400)
RBC: 4.66 MIL/uL (ref 3.87–5.11)
RDW: 13.5 % (ref 11.5–15.5)
WBC: 11.8 10*3/uL — ABNORMAL HIGH (ref 4.0–10.5)

## 2014-01-14 LAB — LACTATE DEHYDROGENASE: LDH: 175 U/L (ref 94–250)

## 2014-01-14 MED ORDER — LANCING DEVICE MISC: Status: DC

## 2014-01-14 MED ORDER — POTASSIUM CHLORIDE CRYS ER 20 MEQ PO TBCR: 20.0000 meq | EXTENDED_RELEASE_TABLET | Freq: Two times a day (BID) | ORAL | Status: DC

## 2014-01-14 NOTE — Progress Notes (Signed)
[facility] A. Zigmund Daniel, M.D.  NEW PATIENT EVALUATION   Name: Madison Gentry: 01/14/2014 MRN: [id number] DOB: [birth year]  PCP: Madison Heap, MD   REFERRING PHYSICIAN: Daphine Gentry, Madison Gentry, *  REASON FOR REFERRAL: Neutrophilic leukocytosis.     HISTORY OF PRESENT ILLNESS:Madison Gentry is a 48 y.o. female who is referred by PCP for evaluation of leukocytosis. Over the past 3 weeks a patient develop right hip pain making it difficult for her to lie on that side. She did not fall. She does have night sweats on and off and currently is experiencing her menstrual.. She has chronic cough without expectoration or hemoptysis. 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 She denies any vaginal discharge was had dyspareunia of late. She denies any diarrhea, constipation, and does have urinary incontinence. She denies any skin rash, but does have occasional headache relieved by over-the-counter analgesics.   PAST MEDICAL HISTORY:  has a past medical history of Stroke; COPD (chronic obstructive pulmonary disease); Hypertension; CAD (coronary artery disease); and Diabetes.     PAST SURGICAL HISTORY: Past Surgical History  Procedure Laterality Gentry  . Tubal ligation       CURRENT MEDICATIONS: has a current medication list which includes the following prescription(s): amlodipine, aspirin-acetaminophen-caffeine, buspirone, ezetimibe, fluoxetine, glucose blood, hydrochlorothiazide, hydrocodone-acetaminophen, hydrocodone-homatropine, ibuprofen, incontinence brief large, metformin, nitroglycerin, simvastatin, zolpidem, ciprofloxacin, and ace tennis elbow strap.   ALLERGIES: Review of patient's allergies indicates no known allergies.   SOCIAL HISTORY:  reports that she has been smoking Cigarettes.  She has a 28 pack-year smoking history. She has never used smokeless tobacco. She  reports that she drinks alcohol. She reports that she does not use illicit drugs.   FAMILY HISTORY: family history includes CAD (age of onset: 63) in her brother; CAD (age of onset: 52) in her father; CAD (age of onset: 41) in her mother.    REVIEW OF SYSTEMS:  Other than that discussed above is noncontributory.    PHYSICAL EXAM:  height is 5' 2.5" (1.588 m) and weight is 193 lb 1.6 oz (87.59 kg). Her oral temperature is 98.8 F (37.1 C). Her blood pressure is 155/65 and her pulse is 108. Her respiration is 20 and oxygen saturation is 96%.    GENERAL:alert, in moderate distress from back pain and right hip pain. SKIN: skin color, texture, turgor are normal, no rashes or significant lesions EYES: normal, Conjunctiva are pink and non-injected, sclera clear OROPHARYNX:no exudate, no erythema and lips, buccal mucosa, and tongue normal  NECK: supple, thyroid normal size, non-tender, without nodularity CHEST: Increased AP diameter with no breast masses. Spider angiomata are noted on the anterior chest LYMPH:  no palpable lymphadenopathy in the cervical, axillary or inguinal LUNGS: clear to auscultation and percussion with normal breathing effort HEART: regular rate & rhythm and no murmurs ABDOMEN:abdomen soft, non-tender and normal bowel sounds. Liver and spleen not enlarged. No free fluid wave or shifting dullness. No CVA tenderness. MUSCULOSKELETALl:no cyanosis of digits, no clubbing or edema  NEURO: alert & oriented x 3 with fluent speech, no focal motor/sensory deficits. Negative straight leg raising bilaterally.    LABORATORY DATA:   Results for ISSIS, LINDSETH (MRN [id number]) as of 01/14/2014 14:38  Ref. Range 12/11/2013 13:44 12/15/2013 10:17 12/18/2013 09:16 12/21/2013 12:26 12/28/2013 12:29  WBC Latest Range: 4.6-10.2 K/uL 18.4 (A) 17.1 (A) 17.0 (  A) 16.3 (A) 13.9 (A)   Results for SERYNA, MAREK (MRN [id number]) as of 01/14/2014 14:38  Ref. Range 12/11/2013 13:44 12/15/2013 10:17  12/18/2013 09:16 12/21/2013 12:26 12/28/2013 12:29  Platelet Count, POC Latest Range: 142-424 K/uL 471.0 (A) 420.0 487.0 (A) 526.0 (A) 480.0 (A)    Office Visit on 01/14/2014  Component Gentry Value Ref Range Status  . WBC 01/14/2014 11.8* 4.0 - 10.5 K/uL Final  . RBC 01/14/2014 4.66  3.87 - 5.11 MIL/uL Final  . Hemoglobin 01/14/2014 13.5  12.0 - 15.0 g/dL Final  . HCT 13/09/6576 39.4  36.0 - 46.0 % Final  . MCV 01/14/2014 84.5  78.0 - 100.0 fL Final  . MCH 01/14/2014 29.0  26.0 - 34.0 pg Final  . MCHC 01/14/2014 34.3  30.0 - 36.0 g/dL Final  . RDW 46/96/2952 13.5  11.5 - 15.5 % Final  . Platelets 01/14/2014 414* 150 - 400 K/uL Final  . Neutrophils Relative % 01/14/2014 66  43 - 77 % Final  . Neutro Abs 01/14/2014 7.8* 1.7 - 7.7 K/uL Final  . Lymphocytes Relative 01/14/2014 25  12 - 46 % Final  . Lymphs Abs 01/14/2014 3.0  0.7 - 4.0 K/uL Final  . Monocytes Relative 01/14/2014 5  3 - 12 % Final  . Monocytes Absolute 01/14/2014 0.6  0.1 - 1.0 K/uL Final  . Eosinophils Relative 01/14/2014 3  0 - 5 % Final  . Eosinophils Absolute 01/14/2014 0.4  0.0 - 0.7 K/uL Final  . Basophils Relative 01/14/2014 1  0 - 1 % Final  . Basophils Absolute 01/14/2014 0.1  0.0 - 0.1 K/uL Final  Lab on 12/28/2013  Component Gentry Value Ref Range Status  . WBC 12/28/2013 13.9* 4.6 - 10.2 K/uL Final  . Lymph, poc 12/28/2013 3.3  0.6 - 3.4 Final  . POC LYMPH PERCENT 12/28/2013 23.7  10 - 50 %L Final  . POC Granulocyte 12/28/2013 9.8* 2 - 6.9 Final  . Granulocyte percent 12/28/2013 70.3  37 - 80 %G Final  . RBC 12/28/2013 4.7  4.04 - 5.48 M/uL Final  . Hemoglobin 12/28/2013 13.6  12.2 - 16.2 g/dL Final  . HCT, POC 84/13/2440 39.5  37.7 - 47.9 % Final  . MCV 12/28/2013 83.7  80 - 97 fL Final  . MCH, POC 12/28/2013 28.7  27 - 31.2 pg Final  . MCHC 12/28/2013 34.3  31.8 - 35.4 g/dL Final  . RDW, POC 12/23/2534 13.1   Final  . Platelet Count, POC 12/28/2013 480.0* 142 - 424 K/uL Final  . MPV 12/28/2013 7.3  0 -  99.8 fL Final  Lab on 12/21/2013  Component Gentry Value Ref Range Status  . WBC 12/21/2013 16.3* 4.6 - 10.2 K/uL Final  . Lymph, poc 12/21/2013 3.3  0.6 - 3.4 Final  . POC LYMPH PERCENT 12/21/2013 20.5  10 - 50 %L Final  . POC Granulocyte 12/21/2013 12.3* 2 - 6.9 Final  . Granulocyte percent 12/21/2013 75.4  37 - 80 %G Final  . RBC 12/21/2013 4.9  4.04 - 5.48 M/uL Final  . Hemoglobin 12/21/2013 12.9  12.2 - 16.2 g/dL Final  . HCT, POC 64/40/3474 41.0  37.7 - 47.9 % Final  . MCV 12/21/2013 84.4  80 - 97 fL Final  . MCH, POC 12/21/2013 26.7* 27 - 31.2 pg Final  . MCHC 12/21/2013 31.6* 31.8 - 35.4 g/dL Final  . RDW, POC 25/95/6387 13.3   Final  . Platelet Count, POC 12/21/2013 526.0* 142 - 424 K/uL  Final  . MPV 12/21/2013 7.3  0 - 99.8 fL Final  Office Visit on 12/18/2013  Component Gentry Value Ref Range Status  . WBC 12/18/2013 17.0* 4.6 - 10.2 K/uL Final  . Lymph, poc 12/18/2013 3.5* 0.6 - 3.4 Final  . POC LYMPH PERCENT 12/18/2013 20.7  10 - 50 %L Final  . POC Granulocyte 12/18/2013 12.9* 2 - 6.9 Final  . Granulocyte percent 12/18/2013 75.6  37 - 80 %G Final  . RBC 12/18/2013 4.8  4.04 - 5.48 M/uL Final  . Hemoglobin 12/18/2013 13.5  12.2 - 16.2 g/dL Final  . HCT, POC 16/10/960410/23/2015 40.6  37.7 - 47.9 % Final  . MCV 12/18/2013 84.5  80 - 97 fL Final  . MCH, POC 12/18/2013 28.0  27 - 31.2 pg Final  . MCHC 12/18/2013 33.2  31.8 - 35.4 g/dL Final  . RDW, POC 54/09/811910/23/2015 13.0   Final  . Platelet Count, POC 12/18/2013 487.0* 142 - 424 K/uL Final  . MPV 12/18/2013 7.2  0 - 99.8 fL Final    Urinalysis    Component Value Gentry/Time   COLORURINE YELLOW 02/10/2011 1716   APPEARANCEUR HAZY* 02/10/2011 1716   LABSPEC 1.020 02/10/2011 1716   PHURINE 6.0 02/10/2011 1716   GLUCOSEU NEGATIVE 02/10/2011 1716   HGBUR LARGE* 02/10/2011 1716   BILIRUBINUR neg 12/11/2013 1435   BILIRUBINUR NEGATIVE 02/10/2011 1716   KETONESUR NEGATIVE 02/10/2011 1716   PROTEINUR neg 12/11/2013 1435   PROTEINUR  NEGATIVE 02/10/2011 1716   UROBILINOGEN negative 12/11/2013 1435   UROBILINOGEN 0.2 02/10/2011 1716   NITRITE neg 12/11/2013 1435   NITRITE NEGATIVE 02/10/2011 1716   LEUKOCYTESUR Negative 12/11/2013 1435      @RADIOGRAPHY : No results found. Contrast   Status: Final result       PACS Images     Show images for CT Abdomen Pelvis W Contrast     Study Result     CLINICAL DATA: Right lower quadrant pain for 1 week. Elevated white blood cell count.  EXAM: CT ABDOMEN AND PELVIS WITH CONTRAST  TECHNIQUE: Multidetector CT imaging of the abdomen and pelvis was performed using the standard protocol following bolus administration of intravenous contrast.  CONTRAST: 100 mL OMNIPAQUE IOHEXOL 300 MG/ML SOLN  COMPARISON: Single view of the abdomen this same day.  FINDINGS: The lung bases are clear. No pleural or pericardial effusion.  A small capsular calcification is seen along the posterior right hepatic lobe. The liver is otherwise unremarkable. The gallbladder, biliary tree, adrenal glands, spleen, pancreas and kidneys all appear normal. The appendix is well seen and normal in appearance. There is no lymphadenopathy or fluid. Aortoiliac atherosclerosis without aneurysm is noted. The patient has a retroaortic left renal vein. Tiny fat containing umbilical hernia is noted. Uterus, adnexa and urinary bladder are unremarkable. No focal bony abnormality is identified.  IMPRESSION: Negative for appendicitis. No finding to explain the patient's symptoms or elevated white blood cell count.  Scattered aortoiliac atherosclerosis without aneurysm.   Electronically Signed  By: Drusilla Kannerhomas Dalessio M.D.  On: 12/11/2013 19    PATHOLOGY: None.   IMPRESSION:  #1. Leukocytosis, secondary versus primary bone marrow disorder. #2. Low back and right hip pain, probable degenerative disc disease, #3. Chronic obstructive pulmonary disease. #4. Urinary  incontinence. #5. Depression, on treatment. #6. Diabetes mellitus, type II, non-insulin requiring. #7. Hyperlipidemia, on treatment. #8. Hypertension, on treatment.   PLAN:  #1. Lumbosacral spine and right hip x-ray along with chest x-ray PA and lateral.  #2. PCR-ABL  and JAK-2 determination along with leukocyte alkaline phosphatase score. #3. Plan follow-up in 2 weeks with CBC unless abnormality is found that require further intervention before that visit.  I appreciate the opportunity of sharing in her care.   Maurilio LovelyFormanek, Allesandra Huebsch A, MD 01/14/2014 3:57 PM   DISCLAIMER:  This note was dictated with voice recognition softwre.  Similar sounding words can inadvertently be transcribed inaccurately and may not be corrected upon review.

## 2014-01-14 NOTE — Patient Instructions (Signed)
[facility] Discharge Instructions  RECOMMENDATIONS MADE BY THE CONSULTANT AND ANY TEST RESULTS WILL BE SENT TO YOUR REFERRING PHYSICIAN.  EXAM FINDINGS BY THE PHYSICIAN TODAY AND SIGNS OR SYMPTOMS TO REPORT TO CLINIC OR PRIMARY PHYSICIAN: Exam and findings as discussed by Dr. Zigmund DanielFormanek.  Will check some blood work today and will get a chest xray and xrays of your lumbar spine and right hip.  We will see you back in 2 weeks to go over test results.    INSTRUCTIONS/FOLLOW-UP: Follow-up in 2 weeks.  Thank you for choosing [facility] to provide your oncology and hematology care.  To afford each patient quality time with our providers, please arrive at least 15 minutes before your scheduled appointment time.  With your help, our goal is to use those 15 minutes to complete the necessary work-up to ensure our physicians have the information they need to help with your evaluation and healthcare recommendations.    Effective January 1st, 2014, we ask that you re-schedule your appointment with our physicians should you arrive 10 or more minutes late for your appointment.  We strive to give you quality time with our providers, and arriving late affects you and other patients whose appointments are after yours.    Again, thank you for choosing [facility].  Our hope is that these requests will decrease the amount of time that you wait before being seen by our physicians.       _____________________________________________________________  Should you have questions after your visit to [facility], please contact our office at [phone] between the hours of 8:30 a.m. and 4:30 p.m.  Voicemails left after 4:30 p.m. will not be returned until the following business day.  For prescription refill requests, have your pharmacy contact our office with your prescription refill request.     _______________________________________________________________  We hope that we have given you very good care.  You may receive a patient satisfaction survey in the mail, please complete it and return it as soon as possible.  We value your feedback!  _______________________________________________________________  Have you asked about our STAR program?  STAR stands for Survivorship Training and Rehabilitation, and this is a nationally recognized cancer care program that focuses on survivorship and rehabilitation.  Cancer and cancer treatments may cause problems, such as, pain, making you feel tired and keeping you from doing the things that you need or want to do. Cancer rehabilitation can help. Our goal is to reduce these troubling effects and help you have the best quality of life possible.  You may receive a survey from a nurse that asks questions about your current state of health.  Based on the survey results, all eligible patients will be referred to the [facility] for an evaluation so we can better serve you!  A frequently asked questions sheet is available upon request.

## 2014-01-14 NOTE — Progress Notes (Signed)
Madison Gentry presented for labwork. Labs per MD order drawn via Peripheral Line 21 gauge needle inserted in right AC  Good blood return present. Procedure without incident.  Needle removed intact. Patient tolerated procedure well.

## 2014-01-14 NOTE — Progress Notes (Signed)
Call from lab stating that blood tube for leukocyte alkaline phosphatase was inadvertently spun down and new sample would be needed.   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  Labs per MD order drawn via Peripheral Line 23 gauge needle inserted in left AC  Good blood return present. Procedure without incident.  Needle removed intact. Patient tolerated procedure well.

## 2014-01-14 NOTE — Telephone Encounter (Signed)
CRITICAL VALUE ALERT Critical value received:  Potassium 2.9 Date of notification:  01/14/14  Time of notification: 1632  Critical value read back:  Yes.   Nurse who received alert:  Tobie LordsSue Lurine Imel, RN Results called to Dellis Aneshomas Kefalas, PA-C  @ [phone] and orders given for Kdur 20 meq bid until return to office in 2 weeks.  Message left for patient regarding low potassium and that [facility] [place].  Call back confirmation requested.

## 2014-01-14 NOTE — Telephone Encounter (Signed)
rx sent to [facility]

## 2014-01-18 ENCOUNTER — Other Ambulatory Visit: Payer: Self-pay | Admitting: Nurse Practitioner

## 2014-01-18 LAB — BCR/ABL GENE REARRANGEMENT QNT, PCR
BCR ABL1 / ABL1 IS: 0 %
BCR ABL1/ABL1: 0 %

## 2014-01-18 LAB — LEUKOCYTE ALKALINE PHOSPHATASE: LEUK ALK PHOS STAIN: 128 (ref 33–149)

## 2014-01-19 LAB — P190 BCR-ABL 1: P190 BCR ABL1: NOT DETECTED

## 2014-01-19 LAB — P210 BCR-ABL 1: P210 BCR ABL1: NOT DETECTED

## 2014-01-25 ENCOUNTER — Other Ambulatory Visit (HOSPITAL_COMMUNITY): Payer: Self-pay

## 2014-01-25 DIAGNOSIS — D72829 Elevated white blood cell count, unspecified: Secondary | ICD-10-CM

## 2014-01-25 LAB — JAK2 GENOTYPR

## 2014-01-29 ENCOUNTER — Ambulatory Visit (HOSPITAL_COMMUNITY): Payer: Medicaid Other

## 2014-01-29 ENCOUNTER — Encounter (HOSPITAL_COMMUNITY): Payer: Self-pay

## 2014-01-29 ENCOUNTER — Other Ambulatory Visit (HOSPITAL_COMMUNITY): Payer: Medicaid Other

## 2014-01-29 ENCOUNTER — Encounter (HOSPITAL_BASED_OUTPATIENT_CLINIC_OR_DEPARTMENT_OTHER): Payer: Medicaid Other

## 2014-01-29 ENCOUNTER — Encounter (HOSPITAL_COMMUNITY): Payer: Medicaid Other | Attending: Hematology and Oncology

## 2014-01-29 VITALS — BP 164/86 | HR 99 | Temp 98.0°F | Resp 18 | Wt 193.0 lb

## 2014-01-29 DIAGNOSIS — M25551 Pain in right hip: Secondary | ICD-10-CM

## 2014-01-29 DIAGNOSIS — J449 Chronic obstructive pulmonary disease, unspecified: Secondary | ICD-10-CM

## 2014-01-29 DIAGNOSIS — R32 Unspecified urinary incontinence: Secondary | ICD-10-CM

## 2014-01-29 DIAGNOSIS — F329 Major depressive disorder, single episode, unspecified: Secondary | ICD-10-CM

## 2014-01-29 DIAGNOSIS — I10 Essential (primary) hypertension: Secondary | ICD-10-CM

## 2014-01-29 DIAGNOSIS — D72829 Elevated white blood cell count, unspecified: Secondary | ICD-10-CM

## 2014-01-29 DIAGNOSIS — R053 Chronic cough: Secondary | ICD-10-CM

## 2014-01-29 DIAGNOSIS — E119 Type 2 diabetes mellitus without complications: Secondary | ICD-10-CM

## 2014-01-29 DIAGNOSIS — R05 Cough: Secondary | ICD-10-CM

## 2014-01-29 DIAGNOSIS — Z720 Tobacco use: Secondary | ICD-10-CM

## 2014-01-29 DIAGNOSIS — M545 Low back pain: Secondary | ICD-10-CM

## 2014-01-29 LAB — CBC WITH DIFFERENTIAL/PLATELET
Basophils Absolute: 0.1 10*3/uL (ref 0.0–0.1)
Basophils Relative: 1 % (ref 0–1)
Eosinophils Absolute: 0.5 10*3/uL (ref 0.0–0.7)
Eosinophils Relative: 3 % (ref 0–5)
HCT: 41.5 % (ref 36.0–46.0)
HEMOGLOBIN: 14.2 g/dL (ref 12.0–15.0)
LYMPHS ABS: 3 10*3/uL (ref 0.7–4.0)
Lymphocytes Relative: 16 % (ref 12–46)
MCH: 29.3 pg (ref 26.0–34.0)
MCHC: 34.2 g/dL (ref 30.0–36.0)
MCV: 85.7 fL (ref 78.0–100.0)
MONOS PCT: 6 % (ref 3–12)
Monocytes Absolute: 1.1 10*3/uL — ABNORMAL HIGH (ref 0.1–1.0)
NEUTROS ABS: 13.6 10*3/uL — AB (ref 1.7–7.7)
NEUTROS PCT: 74 % (ref 43–77)
Platelets: 472 10*3/uL — ABNORMAL HIGH (ref 150–400)
RBC: 4.84 MIL/uL (ref 3.87–5.11)
RDW: 13.7 % (ref 11.5–15.5)
WBC: 18.2 10*3/uL — ABNORMAL HIGH (ref 4.0–10.5)

## 2014-01-29 NOTE — Progress Notes (Signed)
[facility]  OFFICE PROGRESS NOTE  Redge Gainer, MD [place] [address]  DIAGNOSIS: Leukocytosis  Chronic cough  No chief complaint on file.   CURRENT THERAPY: For follow-up after additional testing for leukocytosis.  INTERVAL HISTORY: Madison Gentry 48 y.o. female returns for follow-up after additional testing for leukocytosis.  She continues to smoke and cannot say more than 3 words without coughing. She does not have inhalers at home. Workup for her hip pain consistent with degenerative joint disease with no evidence of metastatic disease of any kind. Denies any nausea, vomiting, diarrhea, constipation, proptosis, epistaxis, melena, hematochezia, hematuria, lower extremity swelling or redness, skin rash, headache, or seizures.  MEDICAL HISTORY: Past Medical History  Diagnosis Date  . Stroke   . COPD (chronic obstructive pulmonary disease)   . Hypertension   . CAD (coronary artery disease)   . Diabetes     INTERIM HISTORY: has CAD; Hypoxia; Obesity; Depression; Tobacco user; DM (diabetes mellitus); HLD (hyperlipidemia); Back injury; Hypertension; COPD bronchitis; Back pain; Urinary incontinence; and Vitamin D deficiency on her problem list.    ALLERGIES:  has No Known Allergies.  MEDICATIONS: has a current medication list which includes the following prescription(s): amlodipine, aspirin-acetaminophen-caffeine, buspirone, ciprofloxacin, ace tennis elbow strap, ezetimibe, fluoxetine, glucose blood, hydrochlorothiazide, hydrocodone-acetaminophen, hydrocodone-homatropine, ibuprofen, incontinence brief large, lancing device, metformin, nitroglycerin, potassium chloride sa, simvastatin, and zolpidem.  SURGICAL HISTORY:  Past Surgical History  Procedure Laterality Date  . Tubal ligation      FAMILY HISTORY: family history includes CAD (age of onset: 32) in her brother; CAD (age of onset: 31) in her father; CAD (age of  onset: 9) in her mother.  SOCIAL HISTORY:  reports that she has been smoking Cigarettes.  She has a 28 pack-year smoking history. She has never used smokeless tobacco. She reports that she drinks alcohol. She reports that she does not use illicit drugs.  REVIEW OF SYSTEMS:  Other than that discussed above is noncontributory.  PHYSICAL EXAMINATION: ECOG PERFORMANCE STATUS: 1 - Symptomatic but completely ambulatory  Blood pressure 164/86, pulse 99, temperature 98 F (36.7 C), temperature source Oral, resp. rate 18, weight 193 lb (87.544 kg), last menstrual period 01/11/2014.  GENERAL:alert, no distress and comfortable SKIN: skin color, texture, turgor are normal, no rashes or significant lesions EYES: PERLA; Conjunctiva are pink and non-injected, sclera clear SINUSES: No redness or tenderness over maxillary or ethmoid sinuses OROPHARYNX:no exudate, no erythema on lips, buccal mucosa, or tongue. NECK: supple, thyroid normal size, non-tender, without nodularity. No masses CHEST: Increased AP diameter with no breast masses. LYMPH:  no palpable lymphadenopathy in the cervical, axillary or inguinal LUNGS: clear to auscultation and percussion with normal breathing effort HEART: regular rate & rhythm and no murmurs. ABDOMEN:abdomen soft, non-tender and normal bowel sounds. No vaginal megaly, ascites, or CVA tenderness. MUSCULOSKELETAL:no cyanosis of digits and no clubbing. Range of motion normal.  NEURO: alert & oriented x 3 with fluent speech, no focal motor/sensory deficits   LABORATORY DATA: Appointment on 01/29/2014  Component Date Value Ref Range Status  . WBC 01/29/2014 18.2* 4.0 - 10.5 K/uL Final  . RBC 01/29/2014 4.84  3.87 - 5.11 MIL/uL Final  . Hemoglobin 01/29/2014 14.2  12.0 - 15.0 g/dL Final  . HCT 01/29/2014 41.5  36.0 - 46.0 % Final  . MCV 01/29/2014 85.7  78.0 - 100.0 fL Final  . MCH 01/29/2014 29.3  26.0 - 34.0 pg Final  .  MCHC 01/29/2014 34.2  30.0 - 36.0 g/dL Final  .  RDW 01/29/2014 13.7  11.5 - 15.5 % Final  . Platelets 01/29/2014 472* 150 - 400 K/uL Final  . Neutrophils Relative % 01/29/2014 74  43 - 77 % Final  . Neutro Abs 01/29/2014 13.6* 1.7 - 7.7 K/uL Final  . Lymphocytes Relative 01/29/2014 16  12 - 46 % Final  . Lymphs Abs 01/29/2014 3.0  0.7 - 4.0 K/uL Final  . Monocytes Relative 01/29/2014 6  3 - 12 % Final  . Monocytes Absolute 01/29/2014 1.1* 0.1 - 1.0 K/uL Final  . Eosinophils Relative 01/29/2014 3  0 - 5 % Final  . Eosinophils Absolute 01/29/2014 0.5  0.0 - 0.7 K/uL Final  . Basophils Relative 01/29/2014 1  0 - 1 % Final  . Basophils Absolute 01/29/2014 0.1  0.0 - 0.1 K/uL Final  Office Visit on 01/14/2014  Component Date Value Ref Range Status  . WBC 01/14/2014 11.8* 4.0 - 10.5 K/uL Final  . RBC 01/14/2014 4.66  3.87 - 5.11 MIL/uL Final  . Hemoglobin 01/14/2014 13.5  12.0 - 15.0 g/dL Final  . HCT 01/14/2014 39.4  36.0 - 46.0 % Final  . MCV 01/14/2014 84.5  78.0 - 100.0 fL Final  . MCH 01/14/2014 29.0  26.0 - 34.0 pg Final  . MCHC 01/14/2014 34.3  30.0 - 36.0 g/dL Final  . RDW 01/14/2014 13.5  11.5 - 15.5 % Final  . Platelets 01/14/2014 414* 150 - 400 K/uL Final  . Neutrophils Relative % 01/14/2014 66  43 - 77 % Final  . Neutro Abs 01/14/2014 7.8* 1.7 - 7.7 K/uL Final  . Lymphocytes Relative 01/14/2014 25  12 - 46 % Final  . Lymphs Abs 01/14/2014 3.0  0.7 - 4.0 K/uL Final  . Monocytes Relative 01/14/2014 5  3 - 12 % Final  . Monocytes Absolute 01/14/2014 0.6  0.1 - 1.0 K/uL Final  . Eosinophils Relative 01/14/2014 3  0 - 5 % Final  . Eosinophils Absolute 01/14/2014 0.4  0.0 - 0.7 K/uL Final  . Basophils Relative 01/14/2014 1  0 - 1 % Final  . Basophils Absolute 01/14/2014 0.1  0.0 - 0.1 K/uL Final  . Sodium 01/14/2014 140  137 - 147 mEq/L Final  . Potassium 01/14/2014 2.9* 3.7 - 5.3 mEq/L Final   Comment: CRITICAL RESULT CALLED TO, READ BACK BY AND VERIFIED WITH: BURGESS,S ON 01/14/14 AT 1630 BY LOY,C   . Chloride 01/14/2014 98   96 - 112 mEq/L Final  . CO2 01/14/2014 29  19 - 32 mEq/L Final  . Glucose, Bld 01/14/2014 160* 70 - 99 mg/dL Final  . BUN 01/14/2014 11  6 - 23 mg/dL Final  . Creatinine, Ser 01/14/2014 0.50  0.50 - 1.10 mg/dL Final  . Calcium 01/14/2014 9.9  8.4 - 10.5 mg/dL Final  . Total Protein 01/14/2014 7.2  6.0 - 8.3 g/dL Final  . Albumin 01/14/2014 3.7  3.5 - 5.2 g/dL Final  . AST 01/14/2014 17  0 - 37 U/L Final  . ALT 01/14/2014 19  0 - 35 U/L Final  . Alkaline Phosphatase 01/14/2014 64  39 - 117 U/L Final  . Total Bilirubin 01/14/2014 <0.2* 0.3 - 1.2 mg/dL Final  . GFR calc non Af Amer 01/14/2014 >90  >90 mL/min Final  . GFR calc Af Amer 01/14/2014 >90  >90 mL/min Final   Comment: (NOTE) The eGFR has been calculated using the CKD EPI equation. This calculation has not  been validated in all clinical situations. eGFR's persistently <90 mL/min signify possible Chronic Kidney Disease.   . Anion gap 01/14/2014 13  5 - 15 Final  . LDH 01/14/2014 175  94 - 250 U/L Final  . BCR ABL1 / ABL1 01/14/2014 0.000   Final  . BCR ABL1 / ABL1 IS 01/14/2014 0.000   Final  . Interpretation - BCRQ 01/14/2014 REPORT   Final   Comment: (NOTE) The P190 and P210 BCR-ABL1 fusion transcripts are NOT detected. Reverse transcription real-time PCR is performed for the P190 and P210 BCR-ABL1 transcripts associated with the t(9;22) chromosomal translocation. For P190, results are expressed as a percent ratio of BCR-ABL1 to the ABL1 transcript, and further adjusted to the international scale (IS) for P210. Assay sensitivity is dependent on RNA quality and sample cellularity but is usually at least 4-logs below BCR-ABL1 baseline transcript levels. Reference range is 0.000% BCR-ABL1/ABL1. This test was developed and its analytical performance characteristics have been determined by [employer], [place], [place]. It has not been cleared or approved by the FDA. This assay has been validated  pursuant to the CLIA regulations and is used for clinical purposes.                             Neldon Labella, M.D. Programmer, applications Performed at [facility]   . JAK2 GenotypR 01/14/2014 SEE SEPARATE REPORT   Final  . Leukocyte Alkaline  Phos Stain 01/14/2014 128  33 - 149 Final   Comment: (NOTE) TEST INFORMATION:  Leukocyte Alkaline Phosphate (LAP) Stain Access complete set of age- and/ or gender specific reference  intervals for this test in the [facility] Test Directory  (ProgramInsider.com.pt).  The LAP stain determines cytoplasmic LAP enzyme  activity in segmented neutrophils and bands.  An  increased/decreased LAP score can occur in various disease  conditions.  A frequent use of this test is to differentiate  between Chronic Myelogenous Leukemia (CML) and a leukemoid  reaction.  CML has a decreased LAP score; leukemoid reaction has  an increased LAP score. Methodology:  Enzyme cytochemistry  The LAP enzyme present in  neutrophils hydrolyzes the substrate Naphthol AS-BI Phosphate.   The hydrolyzed product couples with the dye (fast red violet Salt  L.B.) to form an insoluble red-brown precipitate in the cytoplasm.  Smears are counterstained with hematoxylin.  The smears are  examined microscopically. Results of this test must be interpreted within the c                          linical  context.  This test should not be used alone to determine  diagnosis. Test developed by [facility]. Performed at [facility]   . P190 BCR ABL1 01/14/2014 Not Detected   Final   Performed at [facility]  . P210 BCR ABL1 01/14/2014 Not Detected   Final   Performed at [place]: JAK-2 and BCR-ABL both negative  Urinalysis    Component Value Date/Time   COLORURINE YELLOW 02/10/2011 [place]* 02/10/2011 1716   LABSPEC 1.020 02/10/2011 1716   PHURINE 6.0 02/10/2011 [place] 02/10/2011 [place]* 02/10/2011 1716   BILIRUBINUR neg 12/11/2013 [place] NEGATIVE 02/10/2011 [place] 02/10/2011 1716   PROTEINUR neg 12/11/2013 [place] 02/10/2011 1716  UROBILINOGEN negative 12/11/2013 1435   UROBILINOGEN 0.2 02/10/2011 1716   NITRITE neg 12/11/2013 1435   NITRITE NEGATIVE 02/10/2011 1716   LEUKOCYTESUR Negative 12/11/2013 1435    RADIOGRAPHIC STUDIES:  Pelvis W Contrast   Status: Final result       PACS Images     Show images for CT Abdomen Pelvis W Contrast     Study Result     CLINICAL DATA: Right lower quadrant pain for 1 week. Elevated white blood cell count.  EXAM: CT ABDOMEN AND PELVIS WITH CONTRAST  TECHNIQUE: Multidetector CT imaging of the abdomen and pelvis was performed using the standard protocol following bolus administration of intravenous contrast.  CONTRAST: 100 mL OMNIPAQUE IOHEXOL 300 MG/ML SOLN  COMPARISON: Single view of the abdomen this same day.  FINDINGS: The lung bases are clear. No pleural or pericardial effusion.  A small capsular calcification is seen along the posterior right hepatic lobe. The liver is otherwise unremarkable. The gallbladder, biliary tree, adrenal glands, spleen, pancreas and kidneys all appear normal. The appendix is well seen and normal in appearance. There is no lymphadenopathy or fluid. Aortoiliac atherosclerosis without aneurysm is noted. The patient has a retroaortic left renal vein. Tiny fat containing umbilical hernia is noted. Uterus, adnexa and urinary bladder are unremarkable. No focal bony abnormality is identified.  IMPRESSION: Negative for appendicitis. No finding to explain the patient's symptoms or elevated white blood cell count.  Scattered aortoiliac atherosclerosis without aneurysm.   Electronically Signed  By: Inge Rise M.D.  On: 12/11/2013 19:15       Dg Chest 2 View  01/14/2014   CLINICAL DATA:   Cough for 6-7 months, history smoking, COPD, hypertension, diabetes, coronary artery disease  EXAM: CHEST  2 VIEW  COMPARISON:  02/10/2011  FINDINGS: Normal heart size, mediastinal contours, and pulmonary vascularity.  LEFT upper lobe appears mildly hyperlucent.  No acute infiltrate, pleural effusion or pneumothorax.  Bones unremarkable.  IMPRESSION: Mildly hyperlucent LEFT upper lobe suggesting emphysematous change.  No acute infiltrate.   Electronically Signed   By: Lavonia Dana M.D.   On: 01/14/2014 18:22   Dg Lumbar Spine Complete  01/14/2014   CLINICAL DATA:  Back pain for 1-2 years, leukocytosis, pain radiating to RIGHT hip, no prior surgery  EXAM: LUMBAR SPINE - COMPLETE 4+ VIEW  COMPARISON:  09/22/2012  FINDINGS: Vertebral body heights maintained without fracture or subluxation.  5 non-rib-bearing lumbar vertebrae.  Disc space heights fairly well maintained, minimal disc space narrowing and endplate spur formation noted at L3-L4.  No spondylolysis.  SI joints symmetric.  Scattered atherosclerotic calcification.  IMPRESSION: Minimal degenerative disc disease changes at L3-L4.  No acute abnormalities.   Electronically Signed   By: Lavonia Dana M.D.   On: 01/14/2014 18:23   Dg Hip Complete Right  01/14/2014   CLINICAL DATA:  RIGHT hip pain for 1 month, no injury  EXAM: RIGHT HIP - COMPLETE 2+ VIEW  COMPARISON:  None  FINDINGS: Symmetric hip and SI joints.  Osseous mineralization grossly normal for technique.  No acute fracture, dislocation, or bone destruction.  Small LEFT pelvic phlebolith noted.  IMPRESSION: No acute osseous abnormalities.   Electronically Signed   By: Lavonia Dana M.D.   On: 01/14/2014 18:24    ASSESSMENT:  #1. Reactive leukocytosis probably secondary to smoking. #2. Low back and right hip pain, probable degenerative disc disease, #3. Chronic obstructive pulmonary disease. #4. Urinary incontinence. #5. Depression, on treatment. #6. Diabetes mellitus, type II, non-insulin  requiring. #  7. Hyperlipidemia, on treatment. #8. Hypertension, on treatment  PLAN:  #1. Consult with family physician regarding use of inhalers as well as analgesics. #2. Discontinue smoking by smoking one less cigarette each day until down to 3 or 4 per day. She was told this will prolong her survival. #3. No follow-up was scheduled in this office.   All questions were answered. The patient knows to call the clinic with any problems, questions or concerns. We can certainly see the patient much sooner if necessary.   I spent 25 minutes counseling the patient face to face. The total time spent in the appointment was 30 minutes.    Doroteo Bradford, MD 01/29/2014 9:25 AM  DISCLAIMER:  This note was dictated with voice recognition software.  Similar sounding words can inadvertently be transcribed inaccurately and may not be corrected upon review.

## 2014-01-29 NOTE — Progress Notes (Signed)
Labs for cbcd 

## 2014-01-29 NOTE — Patient Instructions (Signed)
[facility] Discharge Instructions  RECOMMENDATIONS MADE BY THE CONSULTANT AND ANY TEST RESULTS WILL BE SENT TO YOUR REFERRING PHYSICIAN.  No further follow up is necessary with this clinic. Smoking cessation - decrease by one cigarette daily until down to zero. Follow-up with your primary care doctor regarding pain control of right hip and back.  Thank you for choosing [facility] to provide your oncology and hematology care.  To afford each patient quality time with our providers, please arrive at least 15 minutes before your scheduled appointment time.  With your help, our goal is to use those 15 minutes to complete the necessary work-up to ensure our physicians have the information they need to help with your evaluation and healthcare recommendations.    Effective January 1st, 2014, we ask that you re-schedule your appointment with our physicians should you arrive 10 or more minutes late for your appointment.  We strive to give you quality time with our providers, and arriving late affects you and other patients whose appointments are after yours.    Again, thank you for choosing [facility].  Our hope is that these requests will decrease the amount of time that you wait before being seen by our physicians.       _____________________________________________________________  Should you have questions after your visit to [facility], please contact our office at [phone] between the hours of 8:30 a.m. and 4:30 p.m.  Voicemails left after 4:30 p.m. will not be returned until the following business day.  For prescription refill requests, have your pharmacy contact our office with your prescription refill request.    _______________________________________________________________  We hope that we have given you very good care.  You may receive a patient satisfaction survey in the mail, please complete it and return it as soon as  possible.  We value your feedback!  _______________________________________________________________  Have you asked about our STAR program?  STAR stands for Survivorship Training and Rehabilitation, and this is a nationally recognized cancer care program that focuses on survivorship and rehabilitation.  Cancer and cancer treatments may cause problems, such as, pain, making you feel tired and keeping you from doing the things that you need or want to do. Cancer rehabilitation can help. Our goal is to reduce these troubling effects and help you have the best quality of life possible.  You may receive a survey from a nurse that asks questions about your current state of health.  Based on the survey results, all eligible patients will be referred to the [facility] for an evaluation so we can better serve you!  A frequently asked questions sheet is available upon request.

## 2014-01-30 ENCOUNTER — Other Ambulatory Visit: Payer: Self-pay | Admitting: Nurse Practitioner

## 2014-02-01 ENCOUNTER — Other Ambulatory Visit: Payer: Self-pay | Admitting: Nurse Practitioner

## 2014-02-01 DIAGNOSIS — M545 Low back pain, unspecified: Secondary | ICD-10-CM

## 2014-02-01 MED ORDER — HYDROCODONE-ACETAMINOPHEN 5-325 MG PO TABS: ORAL_TABLET | ORAL | Status: DC

## 2014-02-01 NOTE — Telephone Encounter (Signed)
Last filled 12/31/13, last seen 12/18/13. MMM pt, Rx will print, get moore to sign

## 2014-02-01 NOTE — Telephone Encounter (Signed)
Please review and advisemoo

## 2014-02-01 NOTE — Telephone Encounter (Signed)
This prescription is okay 1 

## 2014-02-12 ENCOUNTER — Other Ambulatory Visit: Payer: Self-pay | Admitting: Nurse Practitioner

## 2014-02-15 NOTE — Telephone Encounter (Signed)
Please advise on refill, was advised at last refill ntbs.

## 2014-03-01 ENCOUNTER — Telehealth: Payer: Self-pay | Admitting: *Deleted

## 2014-03-01 ENCOUNTER — Telehealth: Payer: Self-pay | Admitting: Nurse Practitioner

## 2014-03-01 DIAGNOSIS — M545 Low back pain, unspecified: Secondary | ICD-10-CM

## 2014-03-01 MED ORDER — HYDROCODONE-ACETAMINOPHEN 5-325 MG PO TABS: ORAL_TABLET | ORAL | Status: DC

## 2014-03-01 NOTE — Telephone Encounter (Signed)
Hydrocodone rx ready for pick up Has patient ever seen pain management?

## 2014-03-01 NOTE — Telephone Encounter (Signed)
FYI,         Patient has never been to pain clinic.  Advised that you may consider a referral.  Aware, script for hydrocodone ready.

## 2014-03-15 ENCOUNTER — Other Ambulatory Visit: Payer: Self-pay | Admitting: Nurse Practitioner

## 2014-04-02 ENCOUNTER — Other Ambulatory Visit: Payer: Self-pay | Admitting: Nurse Practitioner

## 2014-04-02 DIAGNOSIS — M545 Low back pain, unspecified: Secondary | ICD-10-CM

## 2014-04-02 MED ORDER — HYDROCODONE-ACETAMINOPHEN 5-325 MG PO TABS: ORAL_TABLET | ORAL | Status: DC

## 2014-04-02 NOTE — Telephone Encounter (Signed)
If she is going to continue on pain meds then she may need referral to pain anagement rx ready for pick up

## 2014-04-05 NOTE — Telephone Encounter (Signed)
Detailed message left on patients voicemail that rx up front and ready to pick up

## 2014-04-06 ENCOUNTER — Telehealth: Payer: Self-pay | Admitting: Nurse Practitioner

## 2014-04-06 MED ORDER — VARENICLINE TARTRATE 0.5 MG X 11 & 1 MG X 42 PO MISC: ORAL | Status: DC

## 2014-04-06 NOTE — Telephone Encounter (Signed)
chantix rx sent to phqrmacy- will have to call back for maintenance dose in 3 weeks

## 2014-04-07 ENCOUNTER — Telehealth: Payer: Self-pay | Admitting: Nurse Practitioner

## 2014-04-07 NOTE — Telephone Encounter (Signed)
Stp and she states she has used the Chantix before and it doesn't work. She wants an rx for the nicotine patch.

## 2014-04-07 NOTE — Telephone Encounter (Signed)
Nicotine patches are OTC and insurance usually does not cover them.

## 2014-04-08 MED ORDER — NICOTINE 21 MG/24HR TD PT24: 21.0000 mg | MEDICATED_PATCH | Freq: Every day | TRANSDERMAL | Status: DC

## 2014-04-08 NOTE — Telephone Encounter (Signed)
rx sent to pharmacy

## 2014-04-08 NOTE — Telephone Encounter (Signed)
Patient aware.

## 2014-04-08 NOTE — Telephone Encounter (Signed)
Nicotine rx sent to pharmacy

## 2014-04-13 ENCOUNTER — Ambulatory Visit: Payer: Medicaid Other | Admitting: Nurse Practitioner

## 2014-04-13 ENCOUNTER — Encounter: Payer: Self-pay | Admitting: Nurse Practitioner

## 2014-04-13 ENCOUNTER — Ambulatory Visit (INDEPENDENT_AMBULATORY_CARE_PROVIDER_SITE_OTHER): Payer: Medicaid Other | Admitting: Nurse Practitioner

## 2014-04-13 VITALS — Ht 62.5 in | Wt 202.8 lb

## 2014-04-13 DIAGNOSIS — L309 Dermatitis, unspecified: Secondary | ICD-10-CM

## 2014-04-13 MED ORDER — HYDROXYZINE PAMOATE 100 MG PO CAPS: 100.0000 mg | ORAL_CAPSULE | Freq: Three times a day (TID) | ORAL | Status: DC | PRN

## 2014-04-13 MED ORDER — METHYLPREDNISOLONE ACETATE 80 MG/ML IJ SUSP
80.0000 mg | Freq: Once | INTRAMUSCULAR | Status: AC
  Administered 2014-04-13: 80 mg via INTRAMUSCULAR

## 2014-04-13 MED ORDER — ZOLPIDEM TARTRATE 10 MG PO TABS: 10.0000 mg | ORAL_TABLET | Freq: Every evening | ORAL | Status: DC | PRN

## 2014-04-13 NOTE — Progress Notes (Signed)
  Subjective:     Madison Gentry is a 49 y.o. female who presents for evaluation of a rash involving the forearm, lower extremity, upper body, upper extremity and throughout her body. . Rash started 2 weeks ago. Lesions are erythematous, and flat in texture. Rash has not changed over time. Rash is pruritic. Associated symptoms: none. Patient denies: abdominal pain, arthralgia, congestion, cough, decrease in appetite, decrease in energy level, fever, headache, myalgia, nausea, sore throat and vomiting. Patient has not had contacts with similar rash. Patient has not had new exposures (soaps, lotions, laundry detergents, foods, medications, plants, insects or animals).  The following portions of the patient's history were reviewed and updated as appropriate: allergies, current medications, past family history, past medical history, past social history, past surgical history and problem list.  Review of Systems Pertinent items are noted in HPI.    Objective:    Ht 5' 2.5" (1.588 m)  Wt 202 lb 12.8 oz (91.989 kg)  BMI 36.48 kg/m2 General:  alert, cooperative and appears stated age  Skin:   erythemaotius maculo papular lesions scattered all over trunk and arms   heart-RRR no MGR Chest clear in all fields  Assessment:  Dermatitis    Plan:   avoid scratching   Warm compresses Meds ordered this encounter  Medications  . methylPREDNISolone acetate (DEPO-MEDROL) injection 80 mg    Sig:   . hydrOXYzine (VISTARIL) 100 MG capsule    Sig: Take 1 capsule (100 mg total) by mouth 3 (three) times daily as needed for itching.    Dispense:  30 capsule    Refill:  0    Order Specific Question:  Supervising Provider    Answer:  Ernestina PennaMOORE, DONALD W [[id number]   RTO prn  Madison Daphine DeutscherMartin, FNP

## 2014-04-16 ENCOUNTER — Other Ambulatory Visit: Payer: Self-pay | Admitting: Nurse Practitioner

## 2014-04-16 NOTE — Telephone Encounter (Signed)
Last A1C 04/15

## 2014-04-26 ENCOUNTER — Telehealth: Payer: Self-pay | Admitting: Nurse Practitioner

## 2014-04-26 DIAGNOSIS — L309 Dermatitis, unspecified: Secondary | ICD-10-CM

## 2014-04-26 MED ORDER — NITROGLYCERIN 0.4 MG SL SUBL: 0.4000 mg | SUBLINGUAL_TABLET | SUBLINGUAL | Status: DC | PRN

## 2014-04-26 NOTE — Telephone Encounter (Signed)
Needs stat referral to derm

## 2014-04-26 NOTE — Telephone Encounter (Signed)
Patient states that rash has not improved

## 2014-04-26 NOTE — Telephone Encounter (Signed)
Referral has been made and patient aware and rx for nitro sent to pharmacy

## 2014-05-03 ENCOUNTER — Telehealth: Payer: Self-pay | Admitting: Nurse Practitioner

## 2014-05-03 DIAGNOSIS — M545 Low back pain, unspecified: Secondary | ICD-10-CM

## 2014-05-03 MED ORDER — HYDROCODONE-ACETAMINOPHEN 5-325 MG PO TABS: ORAL_TABLET | ORAL | Status: DC

## 2014-05-03 MED ORDER — LINDANE 1 % EX LOTN: 1.0000 "application " | TOPICAL_LOTION | Freq: Once | CUTANEOUS | Status: DC

## 2014-05-03 NOTE — Telephone Encounter (Signed)
Pt aware rx was sent to the office.

## 2014-05-03 NOTE — Telephone Encounter (Signed)
[place] rx sent to pharmacy 

## 2014-05-03 NOTE — Telephone Encounter (Signed)
Hydrocodone rx ready for pick up 

## 2014-05-03 NOTE — Telephone Encounter (Signed)
Pt aware rx ready for pick up. 

## 2014-05-06 ENCOUNTER — Telehealth: Payer: Self-pay | Admitting: Nurse Practitioner

## 2014-05-06 NOTE — Telephone Encounter (Signed)
Appointment given for 2 with [name] tomorrow.

## 2014-05-07 ENCOUNTER — Ambulatory Visit: Payer: Medicaid Other | Admitting: Nurse Practitioner

## 2014-05-12 ENCOUNTER — Telehealth: Payer: Self-pay | Admitting: Nurse Practitioner

## 2014-05-17 ENCOUNTER — Other Ambulatory Visit: Payer: Self-pay | Admitting: Nurse Practitioner

## 2014-05-18 NOTE — Telephone Encounter (Signed)
Unable to reach after multiple attempts.  Encounter closed. 

## 2014-05-20 ENCOUNTER — Other Ambulatory Visit: Payer: Self-pay | Admitting: Nurse Practitioner

## 2014-06-01 ENCOUNTER — Telehealth: Payer: Self-pay | Admitting: Nurse Practitioner

## 2014-06-01 DIAGNOSIS — M545 Low back pain, unspecified: Secondary | ICD-10-CM

## 2014-06-01 MED ORDER — HYDROCODONE-ACETAMINOPHEN 5-325 MG PO TABS: ORAL_TABLET | ORAL | Status: DC

## 2014-06-01 MED ORDER — ZOLPIDEM TARTRATE 10 MG PO TABS: 10.0000 mg | ORAL_TABLET | Freq: Every evening | ORAL | Status: DC | PRN

## 2014-06-01 NOTE — Telephone Encounter (Signed)
lmovm that written Rx is at front office ready to be picked up 

## 2014-06-01 NOTE — Telephone Encounter (Signed)
rx ready for pickup 

## 2014-06-02 ENCOUNTER — Ambulatory Visit (INDEPENDENT_AMBULATORY_CARE_PROVIDER_SITE_OTHER): Payer: Medicaid Other | Admitting: Nurse Practitioner

## 2014-06-02 ENCOUNTER — Encounter: Payer: Self-pay | Admitting: Nurse Practitioner

## 2014-06-02 VITALS — BP 145/92 | HR 107 | Temp 98.6°F | Ht 62.0 in | Wt 196.4 lb

## 2014-06-02 DIAGNOSIS — F329 Major depressive disorder, single episode, unspecified: Secondary | ICD-10-CM

## 2014-06-02 DIAGNOSIS — E669 Obesity, unspecified: Secondary | ICD-10-CM | POA: Diagnosis not present

## 2014-06-02 DIAGNOSIS — I10 Essential (primary) hypertension: Secondary | ICD-10-CM

## 2014-06-02 DIAGNOSIS — E785 Hyperlipidemia, unspecified: Secondary | ICD-10-CM

## 2014-06-02 DIAGNOSIS — M79604 Pain in right leg: Secondary | ICD-10-CM

## 2014-06-02 DIAGNOSIS — F411 Generalized anxiety disorder: Secondary | ICD-10-CM

## 2014-06-02 DIAGNOSIS — F32A Depression, unspecified: Secondary | ICD-10-CM

## 2014-06-02 DIAGNOSIS — E1142 Type 2 diabetes mellitus with diabetic polyneuropathy: Secondary | ICD-10-CM | POA: Diagnosis not present

## 2014-06-02 DIAGNOSIS — M545 Low back pain, unspecified: Secondary | ICD-10-CM

## 2014-06-02 DIAGNOSIS — Z720 Tobacco use: Secondary | ICD-10-CM

## 2014-06-02 LAB — POCT UA - MICROALBUMIN: Microalbumin Ur, POC: 50 mg/L

## 2014-06-02 LAB — POCT GLYCOSYLATED HEMOGLOBIN (HGB A1C): HEMOGLOBIN A1C: 5.7

## 2014-06-02 MED ORDER — EZETIMIBE 10 MG PO TABS: ORAL_TABLET | ORAL | Status: DC

## 2014-06-02 MED ORDER — AMLODIPINE BESYLATE 10 MG PO TABS
10.0000 mg | ORAL_TABLET | Freq: Every day | ORAL | Status: DC
Start: 2014-06-02 — End: 2014-12-15

## 2014-06-02 MED ORDER — MELOXICAM 15 MG PO TABS: 15.0000 mg | ORAL_TABLET | Freq: Every day | ORAL | Status: DC

## 2014-06-02 MED ORDER — METFORMIN HCL 500 MG PO TABS: ORAL_TABLET | ORAL | Status: DC

## 2014-06-02 MED ORDER — HYDROCHLOROTHIAZIDE 25 MG PO TABS: 25.0000 mg | ORAL_TABLET | Freq: Every day | ORAL | Status: DC

## 2014-06-02 MED ORDER — FLUOXETINE HCL 20 MG PO CAPS: 20.0000 mg | ORAL_CAPSULE | Freq: Two times a day (BID) | ORAL | Status: DC

## 2014-06-02 MED ORDER — BUSPIRONE HCL 7.5 MG PO TABS: 7.5000 mg | ORAL_TABLET | Freq: Every day | ORAL | Status: DC

## 2014-06-02 NOTE — Addendum Note (Signed)
Addended by: Orma RenderHODGES, Sherel Fennell F on: 06/02/2014 03:25 PM   Modules accepted: Orders

## 2014-06-02 NOTE — Patient Instructions (Signed)

## 2014-06-02 NOTE — Progress Notes (Addendum)
Subjective:    Patient ID: Madison Gentry, female    DOB: [birth year], 49 y.o.   MRN: [id number]   Patient here today for follow up of chronic medical problems. Her only complaint is right leg pain that starts at knee and goes down- started 3 weeks ago. Rates 10/10 despite taking hydrocodone. constant pain- standing or driving increases pain. Laying down makes it better.   Hyperlipidemia This is a chronic problem. The current episode started more than 1 year ago. Recent lipid tests were reviewed and are variable. Exacerbating diseases include diabetes and obesity. She has no history of hypothyroidism. Pertinent negatives include no chest pain. Current antihyperlipidemic treatment includes statins and ezetimibe. The current treatment provides moderate improvement of lipids. Compliance problems include adherence to diet and adherence to exercise.  Risk factors for coronary artery disease include diabetes mellitus, dyslipidemia, hypertension and obesity.  Hypertension This is a chronic problem. The current episode started more than 1 year ago. The problem has been waxing and waning since onset. The problem is controlled. Pertinent negatives include no chest pain, headaches or peripheral edema. Risk factors for coronary artery disease include stress, obesity, diabetes mellitus and dyslipidemia. Past treatments include diuretics and calcium channel blockers. The current treatment provides moderate improvement. Compliance problems include diet and exercise.  Hypertensive end-organ damage includes CAD/MI.  Diabetes She presents for her follow-up diabetic visit. She has type 2 diabetes mellitus. No MedicAlert identification noted. Her disease course has been fluctuating. There are no hypoglycemic associated symptoms. Pertinent negatives for hypoglycemia include no headaches. Pertinent negatives for diabetes include no chest pain. There are no hypoglycemic complications. There are no diabetic complications. Current  diabetic treatment includes oral agent (monotherapy). She is compliant with treatment most of the time. Her weight is stable. When asked about meal planning, she reported none. She has not had a previous visit with a dietitian. She never participates in exercise. Home blood sugar record trend: does not check blood sugars  An ACE inhibitor/angiotensin II receptor blocker is not being taken. She does not see a podiatrist.Eye exam is not current.  hypokalemia stoped taking K+- no c/o muscle craming insomnia ambien nightly helps her sleep- feels rested in AM GAD buspar not working real well for her. Says that in evenings she feels stressed out. Helps some. depression prozac daily- really helps her a lot- feels like she would go crazy without it- no c/o side effects. Chronic low back pain On norco daily- can't tolerate pain without taking  Review of Systems  Constitutional: Negative.   HENT: Negative.   Respiratory: Negative.   Cardiovascular: Negative for chest pain.  Gastrointestinal: Negative.   Genitourinary: Negative.   Neurological: Negative for headaches.  Hematological: Negative.   Psychiatric/Behavioral: Negative.   All other systems reviewed and are negative.      Objective:   Physical Exam  Constitutional: She is oriented to person, place, and time. She appears well-developed and well-nourished.  HENT:  Nose: Nose normal.  Mouth/Throat: Oropharynx is clear and moist.  Eyes: EOM are normal.  Neck: Trachea normal, normal range of motion and full passive range of motion without pain. Neck supple. No JVD present. Carotid bruit is not present. No thyromegaly present.  Cardiovascular: Normal rate, regular rhythm, normal heart sounds and intact distal pulses.  Exam reveals no gallop and no friction rub.   No murmur heard. Pulmonary/Chest: Effort normal and breath sounds normal.  Abdominal: Soft. Bowel sounds are normal. She exhibits no distension and no mass. There  is no  tenderness.  Musculoskeletal: Normal range of motion.  No right knee effusion From without pain No pain on palpation (-) homan sign  Lymphadenopathy:    She has no cervical adenopathy.  Neurological: She is alert and oriented to person, place, and time. She has normal reflexes.  Skin: Skin is warm and dry.  Psychiatric: She has a normal mood and affect. Her behavior is normal. Judgment and thought content normal.   BP 145/92 mmHg  Pulse 107  Temp(Src) 98.6 F (37 C) (Oral)  Ht _0  (1.575 m)  Wt 196 lb 6.4 oz (89.086 kg)  BMI 35.91 kg/m2  Results for orders placed or performed in visit on 06/02/14  POCT glycosylated hemoglobin (Hb A1C)  Result Value Ref Range   Hemoglobin A1C 5.7%   POCT UA - Microalbumin  Result Value Ref Range   Microalbumin Ur, POC 50 mg/L   Creatinine, POC  mg/dL   Albumin/Creatinine Ratio, Urine, POC           Assessment & Plan:  1. Essential hypertension Do not add salt to diet - CMP14+EGFR - amLODipine (NORVASC) 10 MG tablet; Take 1 tablet (10 mg total) by mouth daily.  Dispense: 30 tablet; Refill: 5 - hydrochlorothiazide (HYDRODIURIL) 25 MG tablet; Take 1 tablet (25 mg total) by mouth daily.  Dispense: 30 tablet; Refill: 5  2. Type 2 diabetes mellitus with diabetic polyneuropathy Count carbs - POCT glycosylated hemoglobin (Hb A1C) - POCT UA - Microalbumin - metFORMIN (GLUCOPHAGE) 500 MG tablet; TAKE ONE TABLET BY MOUTH ONCE DAILY WITH BREAKFAST  Dispense: 30 tablet; Refill: 6  3. Obesity Discussed diet and exercise for person with BMI >25 Will recheck weight in 3-6 months   4. HLD (hyperlipidemia) Low fat diet - NMR, lipoprofile - ezetimibe (ZETIA) 10 MG tablet; TAKE ONE TABLET BY MOUTH ONCE DAILY (NEEDS TO BE SEEN BEFORE REFILLS)  Dispense: 30 tablet; Refill: 5  5. Tobacco user Smoking cessation encourgaed  6. Depression Stress manbagement - FLUoxetine (PROZAC) 20 MG capsule; Take 1 capsule (20 mg total) by mouth 2 (two) times  daily.  Dispense: 180 capsule; Refill: 5  7. Midline low back pain without sciatica Continue pain meds as rx  8. GAD (generalized anxiety disorder) - busPIRone (BUSPAR) 7.5 MG tablet; Take 1 tablet (7.5 mg total) by mouth daily.  Dispense: 30 tablet; Refill: 5  9. Right leg pain If not improving RTO - meloxicam (MOBIC) 15 MG tablet; Take 1 tablet (15 mg total) by mouth daily.  Dispense: 30 tablet; Refill: 3    Labs pending Health maintenance reviewed Diet and exercise encouraged Continue all meds Follow up  In 3 months   [place], FNP

## 2014-06-03 LAB — CMP14+EGFR
ALT: 27 IU/L (ref 0–32)
AST: 21 IU/L (ref 0–40)
Albumin/Globulin Ratio: 1.8 (ref 1.1–2.5)
Albumin: 4.4 g/dL (ref 3.5–5.5)
Alkaline Phosphatase: 60 IU/L (ref 39–117)
BUN/Creatinine Ratio: 20 (ref 9–23)
BUN: 10 mg/dL (ref 6–24)
Bilirubin Total: <0.2 mg/dL (ref 0.0–1.2)
CALCIUM: 9.5 mg/dL (ref 8.7–10.2)
CO2: 25 mmol/L (ref 18–29)
CREATININE: 0.51 mg/dL — AB (ref 0.57–1.00)
Chloride: 98 mmol/L (ref 97–108)
GFR calc Af Amer: 133 mL/min/{1.73_m2} (ref >59)
GFR calc non Af Amer: 116 mL/min/{1.73_m2} (ref >59)
GLUCOSE: 120 mg/dL — AB (ref 65–99)
Globulin, Total: 2.5 g/dL (ref 1.5–4.5)
Potassium: 3.9 mmol/L (ref 3.5–5.2)
SODIUM: 141 mmol/L (ref 134–144)
Total Protein: 6.9 g/dL (ref 6.0–8.5)

## 2014-06-03 LAB — NMR, LIPOPROFILE
CHOLESTEROL: 148 mg/dL (ref 100–199)
HDL CHOLESTEROL BY NMR: 39 mg/dL — AB (ref >39)
HDL Particle Number: 36.3 umol/L (ref >=30.5)
LDL Particle Number: 1061 nmol/L — ABNORMAL HIGH (ref <1000)
LDL SIZE: 20.3 nm (ref >20.5)
LDL-C: 81 mg/dL (ref 0–99)
LP-IR Score: 61 — ABNORMAL HIGH (ref <=45)
SMALL LDL PARTICLE NUMBER: 620 nmol/L — AB (ref <=527)
Triglycerides by NMR: 139 mg/dL (ref 0–149)

## 2014-06-03 LAB — MICROALBUMIN, URINE: Microalbumin, Urine: 25.7 ug/mL — ABNORMAL HIGH (ref 0.0–17.0)

## 2014-06-10 ENCOUNTER — Encounter (HOSPITAL_COMMUNITY): Payer: Self-pay | Admitting: Emergency Medicine

## 2014-06-10 ENCOUNTER — Emergency Department (HOSPITAL_COMMUNITY): Payer: Medicaid Other

## 2014-06-10 ENCOUNTER — Emergency Department (HOSPITAL_COMMUNITY)
Admission: EM | Admit: 2014-06-10 | Discharge: 2014-06-10 | Disposition: A | Discharge status: Home / Self Care | Payer: Medicaid Other | Attending: Emergency Medicine | Admitting: Emergency Medicine

## 2014-06-10 DIAGNOSIS — Z8673 Personal history of transient ischemic attack (TIA), and cerebral infarction without residual deficits: Secondary | ICD-10-CM | POA: Diagnosis not present

## 2014-06-10 DIAGNOSIS — I2510 Atherosclerotic heart disease of native coronary artery without angina pectoris: Secondary | ICD-10-CM | POA: Diagnosis not present

## 2014-06-10 DIAGNOSIS — I10 Essential (primary) hypertension: Secondary | ICD-10-CM | POA: Insufficient documentation

## 2014-06-10 DIAGNOSIS — M25561 Pain in right knee: Secondary | ICD-10-CM | POA: Diagnosis present

## 2014-06-10 DIAGNOSIS — Z720 Tobacco use: Secondary | ICD-10-CM | POA: Diagnosis not present

## 2014-06-10 DIAGNOSIS — E119 Type 2 diabetes mellitus without complications: Secondary | ICD-10-CM | POA: Insufficient documentation

## 2014-06-10 DIAGNOSIS — J449 Chronic obstructive pulmonary disease, unspecified: Secondary | ICD-10-CM | POA: Insufficient documentation

## 2014-06-10 DIAGNOSIS — Z791 Long term (current) use of non-steroidal anti-inflammatories (NSAID): Secondary | ICD-10-CM | POA: Diagnosis not present

## 2014-06-10 DIAGNOSIS — Z79899 Other long term (current) drug therapy: Secondary | ICD-10-CM | POA: Diagnosis not present

## 2014-06-10 MED ORDER — OXYCODONE-ACETAMINOPHEN 5-325 MG PO TABS: 1.0000 | ORAL_TABLET | ORAL | Status: DC | PRN

## 2014-06-10 NOTE — ED Notes (Signed)
Pt states she has ongoing R knee pain x 1 month. Pt states she was seen by her PCP 1 week ago and told she had fluid on her knee, given NSAID. Pt states symptoms are worse and pain is increasing.

## 2014-06-10 NOTE — Discharge Instructions (Signed)
Knee Pain °Knee pain can be a result of an injury or other medical conditions. Treatment will depend on the cause of your pain. °HOME CARE °· Only take medicine as told by your doctor. °· Keep a healthy weight. Being overweight can make the knee hurt more. °· Stretch before exercising or playing sports. °· If there is constant knee pain, change the way you exercise. Ask your doctor for advice. °· Make sure shoes fit well. Choose the right shoe for the sport or activity. °· Protect your knees. Wear kneepads if needed. °· Rest when you are tired. °GET HELP RIGHT AWAY IF:  °· Your knee pain does not stop. °· Your knee pain does not get better. °· Your knee joint feels hot to the touch. °· You have a fever. °MAKE SURE YOU:  °· Understand these instructions. °· Will watch this condition. °· Will get help right away if you are not doing well or get worse. °Document Released: 05/11/2008 Document Revised: 05/07/2011 Document Reviewed: 05/11/2008 °[employer]® Patient Information ©2015 [employer]. This information is not intended to replace advice given to you by your health care provider. Make sure you discuss any questions you have with your health care provider. ° °

## 2014-06-10 NOTE — ED Notes (Signed)
Patient transported to X-ray 

## 2014-06-12 NOTE — ED Provider Notes (Signed)
CSN: [id number]     Arrival date & time 06/10/14  0941 History   First MD Initiated Contact with Patient 06/10/14 1136     Chief Complaint  Patient presents with  . Knee Pain     (Consider location/radiation/quality/duration/timing/severity/associated sxs/prior Treatment) HPI  Madison Gentry is a 49 y.o. female who presents to the Emergency Department complaining of right knee pain for one month.  Pain is described as aching and worse with bending and weight bearing.  She states she saw her PMD one week ago for same and was told she had fluid on her knee.  She reports being prescribed a NSAID which has not helped her symptoms.  She denies injury, redness, numbness or weakness of the extremity or calf pain.  She also states that she takes vicodin but that is not helping the pain.     Past Medical History  Diagnosis Date  . Stroke   . COPD (chronic obstructive pulmonary disease)   . Hypertension   . CAD (coronary artery disease)   . Diabetes    Past Surgical History  Procedure Laterality Date  . Tubal ligation     Family History  Problem Relation Age of Onset  . CAD Father 3445    Died age 47  . CAD Brother 8840  . CAD Mother 5950   History  Substance Use Topics  . Smoking status: Current Every Day Smoker -- 1.50 packs/day for 28 years    Types: Cigarettes  . Smokeless tobacco: Never Used  . Alcohol Use: Yes     Comment: occas   OB History    Gravida Para Term Preterm AB TAB SAB Ectopic Multiple Living   3 3 3             Review of Systems  Constitutional: Negative for fever and chills.  Genitourinary: Negative for dysuria and difficulty urinating.  Musculoskeletal: Positive for arthralgias (right knee pain). Negative for joint swelling.  Skin: Negative for color change and wound.  Neurological: Negative for weakness and numbness.  All other systems reviewed and are negative.     Allergies  Review of patient's allergies indicates no known allergies.  Home Medications    Prior to Admission medications   Medication Sig Start Date End Date Taking? Authorizing Provider  amLODipine (NORVASC) 10 MG tablet Take 1 tablet (10 mg total) by mouth daily. 06/02/14  Yes Mary-Margaret Daphine DeutscherMartin, FNP  Aspirin-Acetaminophen-Caffeine (GOODYS EXTRA STRENGTH) 520-260-32.5 MG PACK Take 1 packet by mouth 2 (two) times daily as needed. For pain or headache    Yes Historical Provider, MD  busPIRone (BUSPAR) 7.5 MG tablet Take 1 tablet (7.5 mg total) by mouth daily. 06/02/14  Yes Mary-Margaret Daphine DeutscherMartin, FNP  ezetimibe (ZETIA) 10 MG tablet TAKE ONE TABLET BY MOUTH ONCE DAILY (NEEDS TO BE SEEN BEFORE REFILLS) 06/02/14  Yes Mary-Margaret Daphine DeutscherMartin, FNP  FLUoxetine (PROZAC) 20 MG capsule Take 1 capsule (20 mg total) by mouth 2 (two) times daily. 06/02/14  Yes Mary-Margaret Daphine DeutscherMartin, FNP  hydrochlorothiazide (HYDRODIURIL) 25 MG tablet Take 1 tablet (25 mg total) by mouth daily. 06/02/14  Yes Mary-Margaret Daphine DeutscherMartin, FNP  HYDROcodone-acetaminophen (NORCO/VICODIN) 5-325 MG per tablet TAKE ONE TABLET BY MOUTH TWICE DAILY AS NEEDED 06/01/14  Yes Mary-Margaret Daphine DeutscherMartin, FNP  hydrOXYzine (VISTARIL) 100 MG capsule Take 1 capsule (100 mg total) by mouth 3 (three) times daily as needed for itching. 04/13/14  Yes Mary-Margaret Daphine DeutscherMartin, FNP  ibuprofen (ADVIL,MOTRIN) 200 MG tablet Take 200 mg by mouth every 6 (six) hours as  needed. Takes 800 mg when needed for menstrual cramping   Yes Historical Provider, MD  lindane lotion (KWELL) 1 % Apply 1 application topically once. 05/03/14  Yes Mary-Margaret Daphine Deutscher, FNP  meloxicam (MOBIC) 15 MG tablet Take 1 tablet (15 mg total) by mouth daily. 06/02/14  Yes Mary-Margaret Daphine Deutscher, FNP  metFORMIN (GLUCOPHAGE) 500 MG tablet TAKE ONE TABLET BY MOUTH ONCE DAILY WITH BREAKFAST 06/02/14  Yes Mary-Margaret Daphine Deutscher, FNP  nitroGLYCERIN (NITROSTAT) 0.4 MG SL tablet Place 1 tablet (0.4 mg total) under the tongue every 5 (five) minutes as needed for chest pain. 04/26/14  Yes Mary-Margaret Daphine Deutscher, FNP  potassium  chloride SA (K-DUR,KLOR-CON) 20 MEQ tablet Take 1 tablet (20 mEq total) by mouth 2 (two) times daily. - until seen in office on 12/4//15 01/14/14  Yes Maurine Minister Kefalas, PA-C  simvastatin (ZOCOR) 40 MG tablet Take 1 tablet (40 mg total) by mouth at bedtime. 12/22/13  Yes Mary-Margaret Daphine Deutscher, FNP  zolpidem (AMBIEN) 10 MG tablet Take 1 tablet (10 mg total) by mouth at bedtime as needed for sleep. 06/01/14  Yes Mary-Margaret Daphine Deutscher, FNP  Elastic Bandages & Supports (ACE TENNIS ELBOW STRAP) MISC 1 each by Does not apply route daily. Patient not taking: Reported on 06/10/2014 12/18/13   Mary-Margaret Daphine Deutscher, FNP  nicotine (NICODERM CQ - DOSED IN MG/24 HOURS) 21 mg/24hr patch Place 1 patch (21 mg total) onto the skin daily. Patient not taking: Reported on 06/10/2014 04/08/14   Mary-Margaret Daphine Deutscher, FNP  oxyCODONE-acetaminophen (PERCOCET/ROXICET) 5-325 MG per tablet Take 1 tablet by mouth every 4 (four) hours as needed. 06/10/14   Fayette Ducre, PA-C   BP 127/75 mmHg  Pulse 94  Temp(Src) 97.5 F (36.4 C) (Oral)  Resp 14  Ht  (1.575 m)  Wt 193 lb (87.544 kg)  BMI 35.29 kg/m2  SpO2 96%  LMP 05/17/2014 Physical Exam  Constitutional: She is oriented to person, place, and time. She appears well-developed and well-nourished. No distress.  Cardiovascular: Normal rate, regular rhythm, normal heart sounds and intact distal pulses.   Pulmonary/Chest: Effort normal and breath sounds normal. No respiratory distress.  Musculoskeletal: She exhibits tenderness. She exhibits no edema.  ttp of the anterior right knee.  No erythema, effusion, or step-off deformity.  DP pulse brisk, distal sensation intact. Calf is soft and NT.  Neurological: She is alert and oriented to person, place, and time. She exhibits normal muscle tone. Coordination normal.  Skin: Skin is warm and dry. No erythema.  Nursing note and vitals reviewed.   ED Course  Procedures (including critical care time) Labs Review Labs Reviewed - No  data to display  Imaging Review Dg Knee Complete 4 Views Right  06/10/2014   CLINICAL DATA:  PAIN AND FLUID TO POSTERIOR RIGHT KNEE X 3-4 WEEKS, NO KNOWN INJURY  EXAM: RIGHT KNEE - COMPLETE 4+ VIEW  COMPARISON:  None.  FINDINGS: No fracture. No bone lesion. Knee joint is normally spaced and aligned. No joint effusion. Minor spurring from the lateral margin of the patella. No other arthropathic change.  Soft tissues are unremarkable.  IMPRESSION: No fracture or acute finding.  No joint effusion.   Electronically Signed   By: Amie Portland M.D.   On: 06/10/2014 12:06  '   EKG Interpretation None      MDM   Final diagnoses:  Knee pain, acute, right   Pt is well appearing.  No concerning sx's for septic joint.  No clinical or radiographic evidence of effusion.  Pt appears stable for  d/c and agrees to f/u with PMD and given ortho referral  Pauline Aus, PA-C 06/12/14 2218  Bethann Berkshire, MD 06/14/14 [phone]

## 2014-06-14 ENCOUNTER — Telehealth: Payer: Self-pay | Admitting: Nurse Practitioner

## 2014-06-14 MED ORDER — CLINDAMYCIN HCL 300 MG PO CAPS: 300.0000 mg | ORAL_CAPSULE | Freq: Four times a day (QID) | ORAL | Status: DC

## 2014-06-14 NOTE — Telephone Encounter (Signed)
Patient aware and verbalizes understanding. 

## 2014-06-14 NOTE — Telephone Encounter (Signed)
Cleocin rx sent to pharmacy- needs to see a dentist ASAP

## 2014-06-14 NOTE — Telephone Encounter (Signed)
Stp she states she woke up with a toothache and now her entire mouth is swollen. She doesn't have a regular dentist and she states she has pain medication, she wants to know if you can send an antibiotic into the pharmacy for her. Please advise.

## 2014-06-16 ENCOUNTER — Telehealth: Payer: Self-pay | Admitting: Nurse Practitioner

## 2014-06-16 MED ORDER — FLUCONAZOLE 150 MG PO TABS: 150.0000 mg | ORAL_TABLET | Freq: Once | ORAL | Status: DC

## 2014-06-16 NOTE — Telephone Encounter (Signed)
Diflucan rx sent

## 2014-06-16 NOTE — Telephone Encounter (Signed)
Informed Rx sent to pharmacy

## 2014-06-17 ENCOUNTER — Telehealth: Payer: Self-pay | Admitting: Nurse Practitioner

## 2014-06-21 ENCOUNTER — Telehealth: Payer: Self-pay | Admitting: Nurse Practitioner

## 2014-06-21 NOTE — Telephone Encounter (Signed)
Pt aware no change in pain medication, therefore no written Rx

## 2014-06-21 NOTE — Telephone Encounter (Signed)
Already on norco- percocet not appropriate

## 2014-07-05 ENCOUNTER — Other Ambulatory Visit: Payer: Self-pay | Admitting: Nurse Practitioner

## 2014-07-05 MED ORDER — ZOLPIDEM TARTRATE 10 MG PO TABS: 10.0000 mg | ORAL_TABLET | Freq: Every evening | ORAL | Status: DC | PRN

## 2014-07-05 MED ORDER — OXYCODONE-ACETAMINOPHEN 5-325 MG PO TABS: 1.0000 | ORAL_TABLET | ORAL | Status: DC | PRN

## 2014-07-05 NOTE — Telephone Encounter (Signed)
Patient aware rx is ready to be picked up 

## 2014-07-05 NOTE — Telephone Encounter (Signed)
Pain rx ready for pick up  

## 2014-07-06 ENCOUNTER — Other Ambulatory Visit: Payer: Self-pay | Admitting: Nurse Practitioner

## 2014-07-06 DIAGNOSIS — M545 Low back pain, unspecified: Secondary | ICD-10-CM

## 2014-07-06 MED ORDER — HYDROCODONE-ACETAMINOPHEN 5-325 MG PO TABS: ORAL_TABLET | ORAL | Status: DC

## 2014-07-07 NOTE — Progress Notes (Signed)
Patient aware rx is ready to be picked up 

## 2014-08-02 ENCOUNTER — Other Ambulatory Visit: Payer: Self-pay | Admitting: Nurse Practitioner

## 2014-08-02 DIAGNOSIS — M545 Low back pain, unspecified: Secondary | ICD-10-CM

## 2014-08-02 DIAGNOSIS — I10 Essential (primary) hypertension: Secondary | ICD-10-CM

## 2014-08-04 MED ORDER — ZOLPIDEM TARTRATE 10 MG PO TABS: 10.0000 mg | ORAL_TABLET | Freq: Every evening | ORAL | Status: DC | PRN

## 2014-08-04 MED ORDER — HYDROCODONE-ACETAMINOPHEN 5-325 MG PO TABS: ORAL_TABLET | ORAL | Status: DC

## 2014-08-04 NOTE — Telephone Encounter (Signed)
Ambien last rx'd 5/9 #30 no refills take 1 PO QHS Norco last rx'd 5/10 #60 1 PO BID prn no refills

## 2014-08-16 ENCOUNTER — Telehealth: Payer: Self-pay

## 2014-08-16 NOTE — Telephone Encounter (Signed)
Zetia non preferred on Medicaid   Preferred meds are Atorvastatin, lovastatin, pravastatin or simvastatin

## 2014-08-16 NOTE — Telephone Encounter (Signed)
Per patient zetia has been covered by her Medicaid in the past.  Advised if something has changed and it is too expensive now that MMM said it is not essential, but if she can take it that would help with better cholesterol control.

## 2014-08-16 NOTE — Telephone Encounter (Signed)
zetia is to be taken in conjunction with statin- ok if can't afford

## 2014-09-01 ENCOUNTER — Telehealth: Payer: Self-pay | Admitting: Nurse Practitioner

## 2014-09-01 DIAGNOSIS — M545 Low back pain, unspecified: Secondary | ICD-10-CM

## 2014-09-01 MED ORDER — HYDROCODONE-ACETAMINOPHEN 5-325 MG PO TABS
ORAL_TABLET | ORAL | Status: DC
Start: 2014-09-01 — End: 2014-11-11

## 2014-09-01 MED ORDER — ZOLPIDEM TARTRATE 10 MG PO TABS
10.0000 mg | ORAL_TABLET | Freq: Every evening | ORAL | Status: DC | PRN
Start: 2014-09-01 — End: 2014-10-05

## 2014-09-01 NOTE — Telephone Encounter (Signed)
Rx ready for pick up. 

## 2014-09-01 NOTE — Telephone Encounter (Signed)
lmovm that written Rx is at the front desk ready for pickup 

## 2014-09-17 ENCOUNTER — Telehealth: Payer: Self-pay | Admitting: Nurse Practitioner

## 2014-09-17 DIAGNOSIS — E785 Hyperlipidemia, unspecified: Secondary | ICD-10-CM

## 2014-09-17 MED ORDER — EZETIMIBE 10 MG PO TABS: ORAL_TABLET | ORAL | Status: DC

## 2014-09-17 MED ORDER — AMOXICILLIN 875 MG PO TABS: 875.0000 mg | ORAL_TABLET | Freq: Two times a day (BID) | ORAL | Status: DC

## 2014-09-17 NOTE — Telephone Encounter (Signed)
Amoxicillin sent to pharmacy. Pt needs to make an appt to dentists

## 2014-09-17 NOTE — Telephone Encounter (Signed)
Detailed message left for patient that abx has been sent into pharmacy.

## 2014-09-27 ENCOUNTER — Telehealth: Payer: Self-pay | Admitting: Nurse Practitioner

## 2014-09-27 NOTE — Telephone Encounter (Signed)
Left detailed message on voicemail.  

## 2014-09-27 NOTE — Telephone Encounter (Signed)
Use otc mucinex or robitussin- NTBS if wants other

## 2014-10-05 ENCOUNTER — Telehealth: Payer: Self-pay | Admitting: Nurse Practitioner

## 2014-10-05 ENCOUNTER — Other Ambulatory Visit: Payer: Self-pay | Admitting: Nurse Practitioner

## 2014-10-05 DIAGNOSIS — F411 Generalized anxiety disorder: Secondary | ICD-10-CM

## 2014-10-05 MED ORDER — OXYCODONE-ACETAMINOPHEN 5-325 MG PO TABS: 1.0000 | ORAL_TABLET | ORAL | Status: DC | PRN

## 2014-10-05 MED ORDER — ZOLPIDEM TARTRATE 10 MG PO TABS: 10.0000 mg | ORAL_TABLET | Freq: Every evening | ORAL | Status: DC | PRN

## 2014-10-05 MED ORDER — BUSPIRONE HCL 7.5 MG PO TABS: 7.5000 mg | ORAL_TABLET | Freq: Every day | ORAL | Status: DC

## 2014-10-05 NOTE — Telephone Encounter (Signed)
Pt aware written Rx is at front desk ready for pickup  

## 2014-10-05 NOTE — Telephone Encounter (Signed)
rx ready for pickup 

## 2014-10-11 ENCOUNTER — Ambulatory Visit (INDEPENDENT_AMBULATORY_CARE_PROVIDER_SITE_OTHER): Payer: Medicaid Other | Admitting: Family Medicine

## 2014-10-11 ENCOUNTER — Encounter: Payer: Self-pay | Admitting: Family Medicine

## 2014-10-11 VITALS — BP 150/95 | HR 89 | Temp 98.5°F | Ht 62.0 in | Wt 200.4 lb

## 2014-10-11 DIAGNOSIS — G8929 Other chronic pain: Secondary | ICD-10-CM | POA: Diagnosis not present

## 2014-10-11 DIAGNOSIS — R1013 Epigastric pain: Secondary | ICD-10-CM

## 2014-10-11 MED ORDER — ESOMEPRAZOLE MAGNESIUM 40 MG PO CPDR: 40.0000 mg | DELAYED_RELEASE_CAPSULE | Freq: Every day | ORAL | Status: DC

## 2014-10-11 NOTE — Progress Notes (Signed)
Subjective:  Patient ID: Madison Gentry, female    DOB: 12/20/1965  Age: 49 y.o. MRN: [id number]  CC: Abdominal Pain   HPI Madison Gentry presents for 4 days of BUQ abd pain. Worse with eating. Appetite is poor. Eating Goodies like candy for HA.  Using hydrocodone for back pain. Went to ED and was given oxy for knee pain.  History Madison Gentry has a past medical history of Stroke; COPD (chronic obstructive pulmonary disease); Hypertension; CAD (coronary artery disease); and Diabetes.   Madison Gentry has past surgical history that includes Tubal ligation.   Her family history includes CAD (age of onset: 11) in her brother; CAD (age of onset: 74) in her father; CAD (age of onset: 82) in her mother.Madison Gentry reports that Madison Gentry has been smoking Cigarettes.  Madison Gentry has a 42 pack-year smoking history. Madison Gentry has never used smokeless tobacco. Madison Gentry reports that Madison Gentry drinks alcohol. Madison Gentry reports that Madison Gentry does not use illicit drugs.  Outpatient Prescriptions Prior to Visit  Medication Sig Dispense Refill  . amLODipine (NORVASC) 10 MG tablet Take 1 tablet (10 mg total) by mouth daily. 30 tablet 5  . Aspirin-Acetaminophen-Caffeine (GOODYS EXTRA STRENGTH) 520-260-32.5 MG PACK Take 1 packet by mouth 2 (two) times daily as needed. For pain or headache     . busPIRone (BUSPAR) 7.5 MG tablet Take 1 tablet (7.5 mg total) by mouth daily. 30 tablet 5  . Elastic Bandages & Supports (ACE TENNIS ELBOW STRAP) MISC 1 each by Does not apply route daily. 1 each 0  . ezetimibe (ZETIA) 10 MG tablet TAKE ONE TABLET BY MOUTH ONCE DAILY (NEEDS TO BE SEEN BEFORE REFILLS) 90 tablet 3  . FLUoxetine (PROZAC) 20 MG capsule Take 1 capsule (20 mg total) by mouth 2 (two) times daily. 180 capsule 5  . hydrochlorothiazide (HYDRODIURIL) 25 MG tablet Take 1 tablet (25 mg total) by mouth daily. 30 tablet 5  . HYDROcodone-acetaminophen (NORCO/VICODIN) 5-325 MG per tablet TAKE ONE TABLET BY MOUTH TWICE DAILY AS NEEDED 60 tablet 0  . ibuprofen (ADVIL,MOTRIN) 200 MG tablet Take  200 mg by mouth every 6 (six) hours as needed. Takes 800 mg when needed for menstrual cramping    . metFORMIN (GLUCOPHAGE) 500 MG tablet TAKE ONE TABLET BY MOUTH ONCE DAILY WITH BREAKFAST 30 tablet 6  . nicotine (NICODERM CQ - DOSED IN MG/24 HOURS) 21 mg/24hr patch Place 1 patch (21 mg total) onto the skin daily. 28 patch 0  . nitroGLYCERIN (NITROSTAT) 0.4 MG SL tablet Place 1 tablet (0.4 mg total) under the tongue every 5 (five) minutes as needed for chest pain. 30 tablet 1  . oxyCODONE-acetaminophen (PERCOCET/ROXICET) 5-325 MG per tablet Take 1 tablet by mouth every 4 (four) hours as needed. 15 tablet 0  . simvastatin (ZOCOR) 40 MG tablet TAKE ONE TABLET BY MOUTH ONCE DAILY 90 tablet 0  . zolpidem (AMBIEN) 10 MG tablet Take 1 tablet (10 mg total) by mouth at bedtime as needed for sleep. 30 tablet 0  . amoxicillin (AMOXIL) 875 MG tablet Take 1 tablet (875 mg total) by mouth 2 (two) times daily. 20 tablet 0  . clindamycin (CLEOCIN) 300 MG capsule Take 1 capsule (300 mg total) by mouth 4 (four) times daily. 40 capsule 0  . fluconazole (DIFLUCAN) 150 MG tablet Take 1 tablet (150 mg total) by mouth once. 1 tablet 0  . hydrOXYzine (VISTARIL) 100 MG capsule Take 1 capsule (100 mg total) by mouth 3 (three) times daily as needed for itching. 30 capsule 0  .  lindane lotion (KWELL) 1 % Apply 1 application topically once. 30 mL 0  . meloxicam (MOBIC) 15 MG tablet Take 1 tablet (15 mg total) by mouth daily. 30 tablet 3  . potassium chloride SA (K-DUR,KLOR-CON) 20 MEQ tablet Take 1 tablet (20 mEq total) by mouth 2 (two) times daily. - until seen in office on 12/4//15 60 tablet 0   No facility-administered medications prior to visit.    ROS Review of Systems  Constitutional: Positive for appetite change. Negative for fever, chills and diaphoresis.  HENT: Negative for congestion and trouble swallowing.   Respiratory: Negative for cough, choking and shortness of breath.   Cardiovascular: Negative for chest  pain.  Gastrointestinal: Positive for abdominal pain and abdominal distention. Negative for nausea, vomiting, diarrhea, constipation and blood in stool.  Genitourinary: Negative for dysuria.    Objective:  BP 150/95 mmHg  Pulse 89  Temp(Src) 98.5 F (36.9 C) (Oral)  Ht  (1.575 m)  Wt 200 lb 6.4 oz (90.901 kg)  BMI 36.64 kg/m2  LMP 10/08/2014  BP Readings from Last 3 Encounters:  10/11/14 150/95  06/10/14 127/75  06/02/14 145/92    Wt Readings from Last 3 Encounters:  10/11/14 200 lb 6.4 oz (90.901 kg)  06/10/14 193 lb (87.544 kg)  06/02/14 196 lb 6.4 oz (89.086 kg)     Physical Exam  Constitutional: Madison Gentry is oriented to person, place, and time. Madison Gentry appears well-developed and well-nourished.  HENT:  Head: Normocephalic and atraumatic.  Cardiovascular: Normal rate and regular rhythm.   No murmur heard. Pulmonary/Chest: Effort normal and breath sounds normal.  Abdominal: Soft. Bowel sounds are normal. Madison Gentry exhibits no mass. There is tenderness (RUQ neg Eulah Pont). There is no rebound and no guarding.  Neurological: Madison Gentry is alert and oriented to person, place, and time.  Skin: Skin is warm and dry.  Psychiatric: Madison Gentry has a normal mood and affect. Her behavior is normal.    Lab Results  Component Value Date   HGBA1C 5.7 06/02/2014   HGBA1C 5.7 06/15/2013   HGBA1C 5.7 11/07/2012    Lab Results  Component Value Date   WBC 18.2* 01/29/2014   HGB 14.2 01/29/2014   HCT 41.5 01/29/2014   PLT 472* 01/29/2014   GLUCOSE 120* 06/02/2014   CHOL 148 06/02/2014   TRIG 139 06/02/2014   HDL 39* 06/02/2014   LDLCALC 80 06/15/2013   ALT 27 06/02/2014   AST 21 06/02/2014   NA 141 06/02/2014   K 3.9 06/02/2014   CL 98 06/02/2014   CREATININE 0.51* 06/02/2014   BUN 10 06/02/2014   CO2 25 06/02/2014   HGBA1C 5.7 06/02/2014   MICROALBUR 2.45* 05/12/2012    Dg Knee Complete 4 Views Right  06/10/2014   CLINICAL DATA:  PAIN AND FLUID TO POSTERIOR RIGHT KNEE X 3-4 WEEKS, NO  KNOWN INJURY  EXAM: RIGHT KNEE - COMPLETE 4+ VIEW  COMPARISON:  None.  FINDINGS: No fracture. No bone lesion. Knee joint is normally spaced and aligned. No joint effusion. Minor spurring from the lateral margin of the patella. No other arthropathic change.  Soft tissues are unremarkable.  IMPRESSION: No fracture or acute finding.  No joint effusion.   Electronically Signed   By: Amie Portland M.D.   On: 06/10/2014 12:06    Assessment & Plan:   Shaterra was seen today for abdominal pain.  Diagnoses and all orders for this visit:  Abdominal pain, chronic, epigastric -     US Abdomen Limited RUQ; Future  Other orders -     esomeprazole (NEXIUM) 40 MG capsule; Take 1 capsule (40 mg total) by mouth daily.   I have discontinued Ms. Roehm's potassium chloride SA, hydrOXYzine, lindane lotion, meloxicam, clindamycin, fluconazole, and amoxicillin. I am also having her start on esomeprazole. Additionally, I am having her maintain her Aspirin-Acetaminophen-Caffeine, ACE TENNIS ELBOW STRAP, ibuprofen, nicotine, nitroGLYCERIN, FLUoxetine, amLODipine, hydrochlorothiazide, metFORMIN, HYDROcodone-acetaminophen, ezetimibe, oxyCODONE-acetaminophen, zolpidem, busPIRone, and simvastatin.  Meds ordered this encounter  Medications  . esomeprazole (NEXIUM) 40 MG capsule    Sig: Take 1 capsule (40 mg total) by mouth daily.    Dispense:  30 capsule    Refill:  3   Discontinue OTC NSAIDs including Goody's, aspirin, ibuprofen.  Follow-up: Return in about 1 month (around 11/11/2014).  Mechele Claude, M.D.

## 2014-10-14 ENCOUNTER — Other Ambulatory Visit: Payer: Self-pay | Admitting: Nurse Practitioner

## 2014-10-14 MED ORDER — OXYCODONE-ACETAMINOPHEN 5-325 MG PO TABS: 1.0000 | ORAL_TABLET | ORAL | Status: DC | PRN

## 2014-10-14 NOTE — Telephone Encounter (Signed)
PT AWARE  

## 2014-10-14 NOTE — Telephone Encounter (Signed)
Pain rx ready for pick up  

## 2014-10-22 ENCOUNTER — Ambulatory Visit (HOSPITAL_COMMUNITY): Admission: RE | Admit: 2014-10-22 | Payer: Medicaid Other | Source: Ambulatory Visit

## 2014-11-08 ENCOUNTER — Ambulatory Visit: Payer: Medicaid Other | Admitting: Nurse Practitioner

## 2014-11-09 ENCOUNTER — Other Ambulatory Visit: Payer: Self-pay | Admitting: Nurse Practitioner

## 2014-11-09 NOTE — Telephone Encounter (Signed)
Last seen 10/11/14  Dr Stacks  If approved route to nurse to call into [facility] 

## 2014-11-10 NOTE — Telephone Encounter (Signed)
Please call in ambien with 1 refills 

## 2014-11-10 NOTE — Telephone Encounter (Signed)
Please review and advise.

## 2014-11-11 ENCOUNTER — Other Ambulatory Visit: Payer: Self-pay | Admitting: Nurse Practitioner

## 2014-11-11 DIAGNOSIS — M545 Low back pain, unspecified: Secondary | ICD-10-CM

## 2014-11-11 MED ORDER — OXYCODONE-ACETAMINOPHEN 5-325 MG PO TABS: 1.0000 | ORAL_TABLET | ORAL | Status: DC | PRN

## 2014-11-11 MED ORDER — ZOLPIDEM TARTRATE 10 MG PO TABS: ORAL_TABLET | ORAL | Status: DC

## 2014-11-11 NOTE — Telephone Encounter (Signed)
Is patient taking hydrocodone or oxycodone?

## 2014-11-11 NOTE — Telephone Encounter (Signed)
Refill called to pharmacy VM 

## 2014-11-11 NOTE — Telephone Encounter (Signed)
rx ready for pickup 

## 2014-11-11 NOTE — Telephone Encounter (Signed)
no more refills without being seen Please call in ambien with 1 refills  

## 2014-11-12 NOTE — Telephone Encounter (Signed)
Called in by Lawerance Bach on 9/15

## 2014-11-12 NOTE — Telephone Encounter (Signed)
Left message to pickup with photo ID and that she needs to be seen for additional refills.

## 2014-11-29 ENCOUNTER — Other Ambulatory Visit: Payer: Self-pay | Admitting: Nurse Practitioner

## 2014-12-08 ENCOUNTER — Other Ambulatory Visit: Payer: Self-pay | Admitting: Nurse Practitioner

## 2014-12-09 MED ORDER — OXYCODONE-ACETAMINOPHEN 5-325 MG PO TABS: 1.0000 | ORAL_TABLET | ORAL | Status: DC | PRN

## 2014-12-09 MED ORDER — ZOLPIDEM TARTRATE 10 MG PO TABS: ORAL_TABLET | ORAL | Status: DC

## 2014-12-09 NOTE — Telephone Encounter (Signed)
lmovm that written Rx is at front desk ready for pickup & to make an appt when she picks Rx up

## 2014-12-09 NOTE — Telephone Encounter (Signed)
rx ready for pick up no more refills without being seen  

## 2014-12-13 ENCOUNTER — Telehealth: Payer: Self-pay | Admitting: Nurse Practitioner

## 2014-12-13 ENCOUNTER — Ambulatory Visit (INDEPENDENT_AMBULATORY_CARE_PROVIDER_SITE_OTHER): Payer: Medicaid Other

## 2014-12-13 DIAGNOSIS — Z23 Encounter for immunization: Secondary | ICD-10-CM

## 2014-12-13 NOTE — Telephone Encounter (Signed)
Patient aware that Madison FloorMary Martin, FNP cancelled her percocet prescription and she will no longer fill here. The pharmacy called and stated she was getting hydrocodone from a pain clinic also.

## 2014-12-15 ENCOUNTER — Other Ambulatory Visit: Payer: Self-pay | Admitting: Nurse Practitioner

## 2014-12-28 ENCOUNTER — Other Ambulatory Visit: Payer: Self-pay | Admitting: Nurse Practitioner

## 2015-01-10 ENCOUNTER — Other Ambulatory Visit: Payer: Self-pay | Admitting: Nurse Practitioner

## 2015-01-11 ENCOUNTER — Telehealth: Payer: Self-pay | Admitting: Nurse Practitioner

## 2015-01-11 NOTE — Telephone Encounter (Signed)
According to telephone encounter in October- patient getting hydrocodone from a pain clinic- will need to continue at pain clinic.

## 2015-01-11 NOTE — Telephone Encounter (Signed)
Last seen 10/11/14  Dr Darlyn ReadStacks  Last lipid 06/02/14

## 2015-01-11 NOTE — Telephone Encounter (Signed)
Spoke with patient and informed her that we can no long prescribe her pain medication that Dr. Hilda LiasKeeling has been prescribing this medication and she will need to get it from him.  Patient states that Dr.  Hilda LiasKeeling will not prescribe pain medication anymore and she needs to get medication from PCP.  I informed patient that we will need written documentation from Dr. Sanjuan DameKeeling's office stating that they will no longer prescribe pain medication for patient.  Also, informed patient that there is still no guarantee that we will prescribe any pain medication.  Patient verbalized understanding and stated Dr. Sanjuan DameKeeling's office will be faxing papers to our office tomorrow 11/16.

## 2015-01-12 ENCOUNTER — Telehealth: Payer: Self-pay | Admitting: Nurse Practitioner

## 2015-01-13 ENCOUNTER — Encounter: Payer: Self-pay | Admitting: Nurse Practitioner

## 2015-01-13 DIAGNOSIS — F1110 Opioid abuse, uncomplicated: Secondary | ICD-10-CM | POA: Insufficient documentation

## 2015-01-13 NOTE — Telephone Encounter (Signed)
Patient has been getting pain meds from DR. Keeling and our office at same time which is NOT allowed- she will need referral to pain management- we will not rx pain meds for her.

## 2015-01-18 ENCOUNTER — Other Ambulatory Visit: Payer: Self-pay | Admitting: Nurse Practitioner

## 2015-01-25 ENCOUNTER — Telehealth: Payer: Self-pay | Admitting: Nurse Practitioner

## 2015-01-25 DIAGNOSIS — M545 Low back pain, unspecified: Secondary | ICD-10-CM

## 2015-01-25 NOTE — Telephone Encounter (Signed)
Referral made to pain clinic. 

## 2015-01-25 NOTE — Telephone Encounter (Signed)
Please address

## 2015-01-27 ENCOUNTER — Telehealth: Payer: Self-pay

## 2015-01-27 NOTE — Telephone Encounter (Signed)
Can you write for me for Hydrocodone until I get into Pain clinic  Can hardly get around

## 2015-01-27 NOTE — Telephone Encounter (Signed)
I cannot because you were getting meds from 2 differnet providers.

## 2015-01-27 NOTE — Telephone Encounter (Signed)
Advised patient that mmm says no to rx

## 2015-02-08 ENCOUNTER — Other Ambulatory Visit: Payer: Self-pay | Admitting: Nurse Practitioner

## 2015-02-08 NOTE — Telephone Encounter (Signed)
Last seen 10/11/14  Dr Darlyn ReadStacks  [facility]

## 2015-02-11 ENCOUNTER — Telehealth: Payer: Self-pay | Admitting: *Deleted

## 2015-02-11 NOTE — Telephone Encounter (Signed)
No xanax without being seen

## 2015-02-11 NOTE — Telephone Encounter (Signed)
Pt requesting refill of Xanax

## 2015-02-11 NOTE — Telephone Encounter (Signed)
Patient informed, appointment made for 02/25/15

## 2015-02-13 ENCOUNTER — Other Ambulatory Visit: Payer: Self-pay | Admitting: Family Medicine

## 2015-02-13 ENCOUNTER — Other Ambulatory Visit: Payer: Self-pay | Admitting: Nurse Practitioner

## 2015-02-17 ENCOUNTER — Other Ambulatory Visit: Payer: Self-pay | Admitting: Family Medicine

## 2015-02-17 NOTE — Telephone Encounter (Signed)
Last seen 10/11/14  Dr Stacks 

## 2015-02-23 ENCOUNTER — Telehealth: Payer: Self-pay | Admitting: Nurse Practitioner

## 2015-02-23 NOTE — Telephone Encounter (Signed)
Please advise 

## 2015-02-24 NOTE — Telephone Encounter (Signed)
Will have to wait and see pain clinic

## 2015-02-24 NOTE — Telephone Encounter (Signed)
Patient aware.

## 2015-02-25 ENCOUNTER — Ambulatory Visit: Payer: Self-pay | Admitting: Nurse Practitioner

## 2015-03-02 ENCOUNTER — Other Ambulatory Visit: Payer: Self-pay | Admitting: Family Medicine

## 2015-03-16 ENCOUNTER — Other Ambulatory Visit: Payer: Self-pay | Admitting: Nurse Practitioner

## 2015-03-16 ENCOUNTER — Other Ambulatory Visit: Payer: Self-pay | Admitting: Family Medicine

## 2015-03-16 NOTE — Telephone Encounter (Signed)
lmovm in another refill request, NTBS w/n 30 days

## 2015-03-16 NOTE — Telephone Encounter (Signed)
Last refill without being seen 

## 2015-03-16 NOTE — Telephone Encounter (Signed)
Last seen 10/11/14  Dr Stacks  Last lipid 06/02/14 

## 2015-03-16 NOTE — Telephone Encounter (Signed)
Last A1C 05/2014

## 2015-03-16 NOTE — Telephone Encounter (Signed)
This is the last refill she can have before she is seen for an appointment. Please ask her to schedule.

## 2015-03-16 NOTE — Telephone Encounter (Signed)
lmovm that refills have been sent to pharmacy & to call to make appt before next refills are due.

## 2015-04-01 ENCOUNTER — Ambulatory Visit (INDEPENDENT_AMBULATORY_CARE_PROVIDER_SITE_OTHER): Payer: Medicaid Other | Admitting: Pediatrics

## 2015-04-01 ENCOUNTER — Encounter: Payer: Self-pay | Admitting: Pediatrics

## 2015-04-01 VITALS — BP 144/83 | HR 90 | Temp 97.1°F | Ht 62.0 in | Wt 210.0 lb

## 2015-04-01 DIAGNOSIS — G4700 Insomnia, unspecified: Secondary | ICD-10-CM

## 2015-04-01 DIAGNOSIS — Z720 Tobacco use: Secondary | ICD-10-CM

## 2015-04-01 DIAGNOSIS — F411 Generalized anxiety disorder: Secondary | ICD-10-CM | POA: Diagnosis not present

## 2015-04-01 DIAGNOSIS — I10 Essential (primary) hypertension: Secondary | ICD-10-CM

## 2015-04-01 DIAGNOSIS — F329 Major depressive disorder, single episode, unspecified: Secondary | ICD-10-CM

## 2015-04-01 DIAGNOSIS — E785 Hyperlipidemia, unspecified: Secondary | ICD-10-CM | POA: Diagnosis not present

## 2015-04-01 DIAGNOSIS — M25561 Pain in right knee: Secondary | ICD-10-CM | POA: Diagnosis not present

## 2015-04-01 DIAGNOSIS — F32A Depression, unspecified: Secondary | ICD-10-CM

## 2015-04-01 DIAGNOSIS — R51 Headache: Secondary | ICD-10-CM

## 2015-04-01 DIAGNOSIS — R519 Headache, unspecified: Secondary | ICD-10-CM

## 2015-04-01 MED ORDER — BUSPIRONE HCL 7.5 MG PO TABS: 7.5000 mg | ORAL_TABLET | Freq: Every day | ORAL | Status: DC

## 2015-04-01 MED ORDER — ZOLPIDEM TARTRATE 10 MG PO TABS: ORAL_TABLET | ORAL | Status: DC

## 2015-04-01 MED ORDER — FLUOXETINE HCL 20 MG PO CAPS: 20.0000 mg | ORAL_CAPSULE | Freq: Two times a day (BID) | ORAL | Status: DC

## 2015-04-01 MED ORDER — HYDROCHLOROTHIAZIDE 25 MG PO TABS: 25.0000 mg | ORAL_TABLET | Freq: Every day | ORAL | Status: DC

## 2015-04-01 NOTE — Progress Notes (Signed)
Subjective:    Patient ID: Madison Gentry, female    DOB: 05/02/1965, 50 y.o.   MRN: [id number]  CC: Medication check   HPI: Madison Gentry is a 50 y.o. female presenting for Medication check  Headaches: daily, around on her head. Takes goody powders most days  Diabetes: takes metformin every day, due for eye exam  Insomnia: taking OTC benadryl on days she doesn't take [place]. Not taking ambien every day.  Knee pain: had appt yesterday with pain clinic, wasn't able to make it due to illness. Has not yet called to reschedule.   Depression screen [facility] 2/9 10/11/2014 06/02/2014 01/14/2014 06/15/2013  Decreased Interest 3 0 0 0  Down, Depressed, Hopeless 3 0 0 0  PHQ - 2 Score 6 0 0 0  Altered sleeping 1 - - -  Tired, decreased energy 3 - - -  Change in appetite 3 - - -  Feeling bad or failure about yourself  1 - - -  Trouble concentrating 2 - - -  Moving slowly or fidgety/restless 0 - - -  Suicidal thoughts 0 - - -  PHQ-9 Score 16 - - -     Relevant past medical, surgical, family and social history reviewed and updated as indicated. Interim medical history since our last visit reviewed. Allergies and medications reviewed and updated.    ROS: Per HPI unless specifically indicated above  History  Smoking status  . Current Every Day Smoker -- 1.50 packs/day for 28 years  . Types: Cigarettes  Smokeless tobacco  . Never Used    Past Medical History Patient Active Problem List   Diagnosis Date Noted  . Narcotic abuse 01/13/2015  . COPD bronchitis 06/15/2013  . Back pain 06/15/2013  . Urinary incontinence 06/15/2013  . Vitamin D deficiency 06/15/2013  . Hypertension 11/07/2012  . Tobacco user 09/22/2012  . DM (diabetes mellitus) (HCC) 09/22/2012  . HLD (hyperlipidemia) 09/22/2012  . Back injury 09/22/2012  . Hypoxia 02/11/2011  . Obesity 02/11/2011  . Depression 02/11/2011  . CAD 01/18/2009        Objective:    BP 144/83 mmHg  Pulse 90  Temp(Src) 97.1 F (36.2  C) (Oral)  Ht  (1.575 m)  Wt 210 lb (95.255 kg)  BMI 38.40 kg/m2  Wt Readings from Last 3 Encounters:  04/01/15 210 lb (95.255 kg)  10/11/14 200 lb 6.4 oz (90.901 kg)  06/10/14 193 lb (87.544 kg)     Gen: NAD, alert, cooperative with exam, NCAT EYES: EOMI, no scleral injection or icterus ENT:  TMs pearly gray b/l, OP without erythema LYMPH: no cervical LAD CV: NRRR, normal S1/S2, no murmur, distal pulses 2+ b/l Resp: CTABL, no wheezes, normal WOB Abd: +BS, soft, NTND. no guarding or organomegaly Ext: No edema, warm Neuro: Alert and oriented MSK: normal muscle bulk     Assessment & Plan:    Madison Gentry was seen today for medication check, multiple med problem f/u.  Diagnoses and all orders for this visit:  Insomnia Discussed sleep hygiene, danger of being on multiple sedating medications at one time. Goal to get off of ambien, should not take it every day. Has not been taking it every day now, takes benadryl at times for sleep. Try melatonin as well for sleep. Gave Rx for #20 tabs in a month. Pt aware and in agreement with plan. -     zolpidem (AMBIEN) 10 MG tablet; TAKE ONE TABLET BY MOUTH ONCE DAILY AT BEDTIME AS NEEDED FOR  SLEEP  Depression Ongoing stress, symptoms fairly well controlled per pt, mood is ok. Continue current emdication. -     FLUoxetine (PROZAC) 20 MG capsule; Take 1 capsule (20 mg total) by mouth 2 (two) times daily.  GAD (generalized anxiety disorder) Symptoms fairly well controlled, continue buspar, fluoxetine -     busPIRone (BUSPAR) 7.5 MG tablet; Take 1 tablet (7.5 mg total) by mouth daily.  Essential hypertension Due for labs in April. Slighlty elevated today. Check BPs at home let me know if regularly elevated. Would need to add additional medication, likely ACE-i. -     hydrochlorothiazide (HYDRODIURIL) 25 MG tablet; Take 1 tablet (25 mg total) by mouth daily.  Tobacco abuse Continue to work on cessation  Nonintractable headache, unspecified  chronicity pattern, unspecified headache type Discussed rebound headache, do not take goody powders every day. Watch BP at home, may be contributing.  Knee pain  Has referral in to pain clinic, not able to prescribe from this office anymore due to breech of narcotic contract receiving medicines from multiple offices at same time, see previous documentation.   Follow up plan: Return in about 3 months (around 06/29/2015) for CPE, needs labs.  Rex Kras, MD [facility] 04/01/2015, 10:20 AM

## 2015-04-08 ENCOUNTER — Other Ambulatory Visit: Payer: Self-pay | Admitting: Pediatrics

## 2015-04-08 MED ORDER — PERMETHRIN 5 % EX CREA: TOPICAL_CREAM | CUTANEOUS | Status: DC

## 2015-04-08 NOTE — Telephone Encounter (Signed)
Patient aware.

## 2015-04-08 NOTE — Telephone Encounter (Signed)
She should wash all sheets and towels in hot water. I sent in Rx for scabies cream.

## 2015-04-15 ENCOUNTER — Other Ambulatory Visit: Payer: Self-pay | Admitting: Family Medicine

## 2015-04-20 ENCOUNTER — Other Ambulatory Visit: Payer: Self-pay | Admitting: Family Medicine

## 2015-04-20 ENCOUNTER — Other Ambulatory Visit: Payer: Self-pay | Admitting: Nurse Practitioner

## 2015-05-18 ENCOUNTER — Telehealth: Payer: Self-pay | Admitting: Pediatrics

## 2015-05-18 ENCOUNTER — Ambulatory Visit: Payer: Medicaid Other | Admitting: Cardiology

## 2015-05-18 NOTE — Telephone Encounter (Signed)
Patient has been taking dayquil, nyquil and alkaseltzer.  Informed patient to try taking delsym

## 2015-05-18 NOTE — Telephone Encounter (Signed)
Returned patient's phone call.  Patient states that she has had a cough for a couple of days and it is keeping her up at night. Would like an rx for Cough syrup.

## 2015-05-20 ENCOUNTER — Other Ambulatory Visit: Payer: Self-pay | Admitting: Family Medicine

## 2015-05-24 ENCOUNTER — Other Ambulatory Visit: Payer: Self-pay | Admitting: Pediatrics

## 2015-05-24 NOTE — Telephone Encounter (Signed)
Last seen 04/01/15 Dr Oswaldo DoneVincent  If approved [facility]

## 2015-05-25 NOTE — Telephone Encounter (Signed)
If she needs a refill now she is taking them every night, I last saw her less than 60 days ago and she has filled #60 tabs since then. As we discussed she needs to try to cut back ambien use, #20 tabs should last her 30 days. Should not need a refill again until June.

## 2015-05-25 NOTE — Telephone Encounter (Signed)
Pt notified of recommendation and rx Verbalizes understanding 

## 2015-06-24 ENCOUNTER — Other Ambulatory Visit: Payer: Self-pay | Admitting: Pediatrics

## 2015-06-27 ENCOUNTER — Telehealth: Payer: Self-pay | Admitting: Pediatrics

## 2015-06-28 NOTE — Telephone Encounter (Signed)
See telephone call from 05/24/15.

## 2015-06-28 NOTE — Telephone Encounter (Signed)
Patient was suppose to see pain clinic in February- did she not go

## 2015-06-29 ENCOUNTER — Other Ambulatory Visit: Payer: Self-pay | Admitting: Nurse Practitioner

## 2015-06-29 DIAGNOSIS — F411 Generalized anxiety disorder: Secondary | ICD-10-CM

## 2015-06-29 NOTE — Telephone Encounter (Signed)
Please review and advise.

## 2015-06-29 NOTE — Telephone Encounter (Signed)
Pt called in requesting refills of Buspar and Ambien Please review and advise

## 2015-06-30 ENCOUNTER — Other Ambulatory Visit: Payer: Self-pay | Admitting: Nurse Practitioner

## 2015-06-30 MED ORDER — BUSPIRONE HCL 7.5 MG PO TABS: 7.5000 mg | ORAL_TABLET | Freq: Every day | ORAL | Status: DC

## 2015-06-30 MED ORDER — ZOLPIDEM TARTRATE 10 MG PO TABS: 10.0000 mg | ORAL_TABLET | Freq: Every evening | ORAL | Status: DC | PRN

## 2015-06-30 NOTE — Addendum Note (Signed)
Addended by: Bennie PieriniMARTIN, MARY-MARGARET on: 06/30/2015 03:58 PM   Modules accepted: Orders

## 2015-06-30 NOTE — Telephone Encounter (Signed)
Both rx's called into pharmacy and pt is aware.

## 2015-06-30 NOTE — Telephone Encounter (Signed)
Please call in ambien 10mg  1 po qhs #30 AND Buspar 7.5mg  Daily #30   with 0 refills

## 2015-07-19 ENCOUNTER — Other Ambulatory Visit: Payer: Self-pay | Admitting: Nurse Practitioner

## 2015-07-19 ENCOUNTER — Other Ambulatory Visit: Payer: Self-pay | Admitting: Pediatrics

## 2015-07-25 ENCOUNTER — Other Ambulatory Visit: Payer: Self-pay | Admitting: Nurse Practitioner

## 2015-07-25 ENCOUNTER — Other Ambulatory Visit: Payer: Self-pay | Admitting: Pediatrics

## 2015-07-26 NOTE — Telephone Encounter (Signed)
Last seen 04/01/15 Dr Oswaldo DoneVincent  [facility]

## 2015-07-29 ENCOUNTER — Other Ambulatory Visit: Payer: Self-pay | Admitting: Nurse Practitioner

## 2015-07-29 ENCOUNTER — Telehealth: Payer: Self-pay | Admitting: Nurse Practitioner

## 2015-07-29 MED ORDER — ZOLPIDEM TARTRATE 10 MG PO TABS: 10.0000 mg | ORAL_TABLET | Freq: Every evening | ORAL | Status: DC | PRN

## 2015-07-29 NOTE — Telephone Encounter (Signed)
Patient was informed via voicemail that prescription was being called in, called [facility] and gave refill information

## 2015-07-29 NOTE — Telephone Encounter (Signed)
Please call in ambien 10mg 1 po Qhs #30  with 0 refills 

## 2015-08-01 ENCOUNTER — Telehealth: Payer: Self-pay | Admitting: Pediatrics

## 2015-08-01 ENCOUNTER — Other Ambulatory Visit: Payer: Self-pay | Admitting: Pediatrics

## 2015-08-01 NOTE — Telephone Encounter (Signed)
Madison Gentry

## 2015-08-01 NOTE — Telephone Encounter (Signed)
done

## 2015-08-01 NOTE — Telephone Encounter (Signed)
Last seen 04/01/15  Dr Oswaldo DoneVincent

## 2015-08-15 ENCOUNTER — Encounter: Payer: Self-pay | Admitting: Physician Assistant

## 2015-08-15 ENCOUNTER — Ambulatory Visit (INDEPENDENT_AMBULATORY_CARE_PROVIDER_SITE_OTHER): Payer: Medicaid Other | Admitting: Physician Assistant

## 2015-08-15 VITALS — BP 140/78 | HR 95 | Temp 98.5°F | Ht 62.0 in | Wt 216.0 lb

## 2015-08-15 DIAGNOSIS — S2090XA Unspecified superficial injury of unspecified parts of thorax, initial encounter: Secondary | ICD-10-CM | POA: Diagnosis not present

## 2015-08-15 DIAGNOSIS — W57XXXA Bitten or stung by nonvenomous insect and other nonvenomous arthropods, initial encounter: Secondary | ICD-10-CM

## 2015-08-15 MED ORDER — DOXYCYCLINE HYCLATE 100 MG PO TABS
100.0000 mg | ORAL_TABLET | Freq: Two times a day (BID) | ORAL | Status: DC
Start: 2015-08-15 — End: 2015-09-23

## 2015-08-15 NOTE — Progress Notes (Signed)
Subjective:     Patient ID: Madison Gentry, female   DOB: [birth year], 50 y.o.   MRN: [id number]  HPI Pt with tick bite to the central chest areas States tick attached less than 12 hrs Since she has had fatigue and general malaise No pain or drainage from the bite site  Review of Systems  Constitutional: Positive for fever, activity change and fatigue. Negative for appetite change.  HENT: Negative.   Respiratory: Negative.   Cardiovascular: Negative.        Objective:   Physical Exam Bite to the central chest area No ulceration or drainage noted No surrounding erythema, induration, or edema    Assessment:     Tick bite    Plan:     Pt would like ATB due to sx Doxycycline 100mg  bid x 10 days Sun precautions reviewed OTC antihist F/U prn

## 2015-08-15 NOTE — Patient Instructions (Signed)
Tick Bite Information Ticks are insects that attach themselves to the skin and draw blood for food. There are various types of ticks. Common types include wood ticks and deer ticks. Most ticks live in shrubs and grassy areas. Ticks can climb onto your body when you make contact with leaves or grass where the tick is waiting. The most common places on the body for ticks to attach themselves are the scalp, neck, armpits, waist, and groin. Most tick bites are harmless, but sometimes ticks carry germs that cause diseases. These germs can be spread to a person during the tick's feeding process. The chance of a disease spreading through a tick bite depends on:   The type of tick.  Time of year.   How long the tick is attached.   Geographic location.  HOW CAN YOU PREVENT TICK BITES? Take these steps to help prevent tick bites when you are outdoors:  Wear protective clothing. Long sleeves and long pants are best.   Wear white clothes so you can see ticks more easily.  Tuck your pant legs into your socks.   If walking on a trail, stay in the middle of the trail to avoid brushing against bushes.  Avoid walking through areas with long grass.  Put insect repellent on all exposed skin and along boot tops, pant legs, and sleeve cuffs.   Check clothing, hair, and skin repeatedly and before going inside.   Brush off any ticks that are not attached.  Take a shower or bath as soon as possible after being outdoors.  WHAT IS THE PROPER WAY TO REMOVE A TICK? Ticks should be removed as soon as possible to help prevent diseases caused by tick bites. 1. If latex gloves are available, put them on before trying to remove a tick.  2. Using fine-point tweezers, grasp the tick as close to the skin as possible. You may also use curved forceps or a tick removal tool. Grasp the tick as close to its head as possible. Avoid grasping the tick on its body. 3. Pull gently with steady upward pressure until  the tick lets go. Do not twist the tick or jerk it suddenly. This may break off the tick's head or mouth parts. 4. Do not squeeze or crush the tick's body. This could force disease-carrying fluids from the tick into your body.  5. After the tick is removed, wash the bite area and your hands with soap and water or other disinfectant such as alcohol. 6. Apply a small amount of antiseptic cream or ointment to the bite site.  7. Wash and disinfect any instruments that were used.  Do not try to remove a tick by applying a hot match, petroleum jelly, or fingernail polish to the tick. These methods do not work and may increase the chances of disease being spread from the tick bite.  WHEN SHOULD YOU SEEK MEDICAL CARE? Contact your health care provider if you are unable to remove a tick from your skin or if a part of the tick breaks off and is stuck in the skin.  After a tick bite, you need to be aware of signs and symptoms that could be related to diseases spread by ticks. Contact your health care provider if you develop any of the following in the days or weeks after the tick bite:  Unexplained fever.  Rash. A circular rash that appears days or weeks after the tick bite may indicate the possibility of Lyme disease. The rash may resemble   a target with a bull's-eye and may occur at a different part of your body than the tick bite.  Redness and swelling in the area of the tick bite.   Tender, swollen lymph glands.   Diarrhea.   Weight loss.   Cough.   Fatigue.   Muscle, joint, or bone pain.   Abdominal pain.   Headache.   Lethargy or a change in your level of consciousness.  Difficulty walking or moving your legs.   Numbness in the legs.   Paralysis.  Shortness of breath.   Confusion.   Repeated vomiting.    This information is not intended to replace advice given to you by your health care provider. Make sure you discuss any questions you have with your health  care provider.   Document Released: 02/10/2000 Document Revised: 03/05/2014 Document Reviewed: 07/23/2012 Elsevier Interactive Patient Education 2016 [employer]  

## 2015-08-22 ENCOUNTER — Telehealth: Payer: Self-pay | Admitting: Pediatrics

## 2015-08-22 MED ORDER — FLUCONAZOLE 150 MG PO TABS: ORAL_TABLET | ORAL | Status: DC

## 2015-08-22 NOTE — Telephone Encounter (Signed)
Sent rx to the pharmacy and left detailed message that rx was sent and to call back with any further questions or concerns.

## 2015-08-24 ENCOUNTER — Other Ambulatory Visit: Payer: Self-pay | Admitting: Nurse Practitioner

## 2015-08-24 ENCOUNTER — Other Ambulatory Visit: Payer: Self-pay | Admitting: Pediatrics

## 2015-08-24 NOTE — Telephone Encounter (Signed)
Last filled 07/29/15, last seen 04/01/15. Route to pool[facility]

## 2015-08-24 NOTE — Telephone Encounter (Signed)
Rx called into pharmacy and left detailed message on pt's voicemail stating requested rx had been called into pharmacy and to call back with any further questions or concerns.

## 2015-08-31 ENCOUNTER — Telehealth: Payer: Self-pay | Admitting: Pediatrics

## 2015-08-31 MED ORDER — FLUOXETINE HCL 20 MG PO CAPS: 20.0000 mg | ORAL_CAPSULE | Freq: Two times a day (BID) | ORAL | Status: DC

## 2015-08-31 NOTE — Telephone Encounter (Signed)
done

## 2015-09-06 ENCOUNTER — Telehealth: Payer: Self-pay | Admitting: Pediatrics

## 2015-09-06 NOTE — Telephone Encounter (Signed)
appt made

## 2015-09-19 ENCOUNTER — Other Ambulatory Visit: Payer: Self-pay | Admitting: Nurse Practitioner

## 2015-09-19 ENCOUNTER — Other Ambulatory Visit: Payer: Self-pay | Admitting: Pediatrics

## 2015-09-23 ENCOUNTER — Ambulatory Visit (INDEPENDENT_AMBULATORY_CARE_PROVIDER_SITE_OTHER): Payer: Medicaid Other | Admitting: Nurse Practitioner

## 2015-09-23 ENCOUNTER — Encounter: Payer: Self-pay | Admitting: Nurse Practitioner

## 2015-09-23 VITALS — BP 129/80 | HR 88 | Temp 96.7°F | Ht 62.0 in | Wt 218.0 lb

## 2015-09-23 DIAGNOSIS — F1110 Opioid abuse, uncomplicated: Secondary | ICD-10-CM

## 2015-09-23 DIAGNOSIS — K219 Gastro-esophageal reflux disease without esophagitis: Secondary | ICD-10-CM | POA: Diagnosis not present

## 2015-09-23 DIAGNOSIS — F411 Generalized anxiety disorder: Secondary | ICD-10-CM

## 2015-09-23 DIAGNOSIS — E1142 Type 2 diabetes mellitus with diabetic polyneuropathy: Secondary | ICD-10-CM | POA: Diagnosis not present

## 2015-09-23 DIAGNOSIS — E785 Hyperlipidemia, unspecified: Secondary | ICD-10-CM | POA: Diagnosis not present

## 2015-09-23 DIAGNOSIS — Z720 Tobacco use: Secondary | ICD-10-CM | POA: Diagnosis not present

## 2015-09-23 DIAGNOSIS — I10 Essential (primary) hypertension: Secondary | ICD-10-CM | POA: Diagnosis not present

## 2015-09-23 DIAGNOSIS — E669 Obesity, unspecified: Secondary | ICD-10-CM

## 2015-09-23 DIAGNOSIS — M545 Low back pain, unspecified: Secondary | ICD-10-CM

## 2015-09-23 DIAGNOSIS — G4700 Insomnia, unspecified: Secondary | ICD-10-CM | POA: Diagnosis not present

## 2015-09-23 DIAGNOSIS — F329 Major depressive disorder, single episode, unspecified: Secondary | ICD-10-CM

## 2015-09-23 DIAGNOSIS — F32A Depression, unspecified: Secondary | ICD-10-CM

## 2015-09-23 LAB — BAYER DCA HB A1C WAIVED: HB A1C: 6.8 % (ref <7.0)

## 2015-09-23 MED ORDER — HYDROCHLOROTHIAZIDE 25 MG PO TABS: 25.0000 mg | ORAL_TABLET | Freq: Every day | ORAL | 1 refills | Status: DC

## 2015-09-23 MED ORDER — METFORMIN HCL 500 MG PO TABS: 500.0000 mg | ORAL_TABLET | Freq: Every day | ORAL | 1 refills | Status: DC

## 2015-09-23 MED ORDER — ZOLPIDEM TARTRATE 10 MG PO TABS: 10.0000 mg | ORAL_TABLET | Freq: Every evening | ORAL | 2 refills | Status: DC | PRN

## 2015-09-23 MED ORDER — FLUOXETINE HCL 20 MG PO CAPS: 20.0000 mg | ORAL_CAPSULE | Freq: Two times a day (BID) | ORAL | 1 refills | Status: DC

## 2015-09-23 MED ORDER — NEXIUM 40 MG PO CPDR: 40.0000 mg | DELAYED_RELEASE_CAPSULE | Freq: Every day | ORAL | 1 refills | Status: DC

## 2015-09-23 MED ORDER — BUSPIRONE HCL 7.5 MG PO TABS: 7.5000 mg | ORAL_TABLET | Freq: Every day | ORAL | 5 refills | Status: DC

## 2015-09-23 MED ORDER — SIMVASTATIN 40 MG PO TABS: 40.0000 mg | ORAL_TABLET | Freq: Every day | ORAL | 1 refills | Status: DC

## 2015-09-23 MED ORDER — AMLODIPINE BESYLATE 10 MG PO TABS: 10.0000 mg | ORAL_TABLET | Freq: Every day | ORAL | 1 refills | Status: DC

## 2015-09-23 MED ORDER — EZETIMIBE 10 MG PO TABS: ORAL_TABLET | ORAL | 1 refills | Status: DC

## 2015-09-23 NOTE — Patient Instructions (Signed)
Smoking Cessation, Tips for Success If you are ready to quit smoking, congratulations! You have chosen to help yourself be healthier. Cigarettes bring nicotine, tar, carbon monoxide, and other irritants into your body. Your lungs, heart, and blood vessels will be able to work better without these poisons. There are many different ways to quit smoking. Nicotine gum, nicotine patches, a nicotine inhaler, or nicotine nasal spray can help with physical craving. Hypnosis, support groups, and medicines help break the habit of smoking. WHAT THINGS CAN I DO TO MAKE QUITTING EASIER?  Here are some tips to help you quit for good:  Pick a date when you will quit smoking completely. Tell all of your friends and family about your plan to quit on that date.  Do not try to slowly cut down on the number of cigarettes you are smoking. Pick a quit date and quit smoking completely starting on that day.  Throw away all cigarettes.   Clean and remove all ashtrays from your home, work, and car.  On a card, write down your reasons for quitting. Carry the card with you and read it when you get the urge to smoke.  Cleanse your body of nicotine. Drink enough water and fluids to keep your urine clear or pale yellow. Do this after quitting to flush the nicotine from your body.  Learn to predict your moods. Do not let a bad situation be your excuse to have a cigarette. Some situations in your life might tempt you into wanting a cigarette.  Never have "just one" cigarette. It leads to wanting another and another. Remind yourself of your decision to quit.  Change habits associated with smoking. If you smoked while driving or when feeling stressed, try other activities to replace smoking. Stand up when drinking your coffee. Brush your teeth after eating. Sit in a different chair when you read the paper. Avoid alcohol while trying to quit, and try to drink fewer caffeinated beverages. Alcohol and caffeine may urge you to  smoke.  Avoid foods and drinks that can trigger a desire to smoke, such as sugary or spicy foods and alcohol.  Ask people who smoke not to smoke around you.  Have something planned to do right after eating or having a cup of coffee. For example, plan to take a walk or exercise.  Try a relaxation exercise to calm you down and decrease your stress. Remember, you may be tense and nervous for the first 2 weeks after you quit, but this will pass.  Find new activities to keep your hands busy. Play with a pen, coin, or rubber band. Doodle or draw things on paper.  Brush your teeth right after eating. This will help cut down on the craving for the taste of tobacco after meals. You can also try mouthwash.   Use oral substitutes in place of cigarettes. Try using lemon drops, carrots, cinnamon sticks, or chewing gum. Keep them handy so they are available when you have the urge to smoke.  When you have the urge to smoke, try deep breathing.  Designate your home as a nonsmoking area.  If you are a heavy smoker, ask your health care provider about a prescription for nicotine chewing gum. It can ease your withdrawal from nicotine.  Reward yourself. Set aside the cigarette money you save and buy yourself something nice.  Look for support from others. Join a support group or smoking cessation program. Ask someone at home or at work to help you with your plan   to quit smoking.  Always ask yourself, "Do I need this cigarette or is this just a reflex?" Tell yourself, "Today, I choose not to smoke," or "I do not want to smoke." You are reminding yourself of your decision to quit.  Do not replace cigarette smoking with electronic cigarettes (commonly called e-cigarettes). The safety of e-cigarettes is unknown, and some may contain harmful chemicals.  If you relapse, do not give up! Plan ahead and think about what you will do the next time you get the urge to smoke. HOW WILL I FEEL WHEN I QUIT SMOKING? You  may have symptoms of withdrawal because your body is used to nicotine (the addictive substance in cigarettes). You may crave cigarettes, be irritable, feel very hungry, cough often, get headaches, or have difficulty concentrating. The withdrawal symptoms are only temporary. They are strongest when you first quit but will go away within 10-14 days. When withdrawal symptoms occur, stay in control. Think about your reasons for quitting. Remind yourself that these are signs that your body is healing and getting used to being without cigarettes. Remember that withdrawal symptoms are easier to treat than the major diseases that smoking can cause.  Even after the withdrawal is over, expect periodic urges to smoke. However, these cravings are generally short lived and will go away whether you smoke or not. Do not smoke! WHAT RESOURCES ARE AVAILABLE TO HELP ME QUIT SMOKING? Your health care provider can direct you to community resources or hospitals for support, which may include:  Group support.  Education.  Hypnosis.  Therapy.   This information is not intended to replace advice given to you by your health care provider. Make sure you discuss any questions you have with your health care provider.   Document Released: 11/11/2003 Document Revised: 03/05/2014 Document Reviewed: 07/31/2012 Elsevier Interactive Patient Education 2016 [employer]  

## 2015-09-23 NOTE — Progress Notes (Signed)
Subjective:    Patient ID: Madison Gentry, female    DOB: 1965-10-09, 50 y.o.   MRN: [id number]  Patient here today for follow up of chronic medical problems.  Outpatient Encounter Prescriptions as of 09/23/2015  Medication Sig  . amLODipine (NORVASC) 10 MG tablet TAKE ONE TABLET BY MOUTH ONCE DAILY  . Aspirin-Acetaminophen-Caffeine (GOODYS EXTRA STRENGTH) 520-260-32.5 MG PACK Take 1 packet by mouth 2 (two) times daily as needed. For pain or headache   . busPIRone (BUSPAR) 7.5 MG tablet TAKE ONE TABLET BY MOUTH ONCE DAILY  . ezetimibe (ZETIA) 10 MG tablet TAKE ONE TABLET BY MOUTH ONCE DAILY (NEEDS TO BE SEEN BEFORE REFILLS)  . FLUoxetine (PROZAC) 20 MG capsule TAKE ONE CAPSULE BY MOUTH TWICE DAILY  . hydrochlorothiazide (HYDRODIURIL) 25 MG tablet TAKE ONE TABLET BY MOUTH ONCE DAILY  . ibuprofen (ADVIL,MOTRIN) 200 MG tablet Take 200 mg by mouth every 6 (six) hours as needed. Takes 800 mg when needed for menstrual cramping  . metFORMIN (GLUCOPHAGE) 500 MG tablet TAKE ONE TABLET BY MOUTH ONCE DAILY BEFORE BREAKFAST MUST  BE  SEEN  FOR  ADDITIONAL  REFILLS  . NEXIUM 40 MG capsule TAKE ONE CAPSULE BY MOUTH ONCE DAILY  . NITROSTAT 0.4 MG SL tablet DISSOLVE ONE TABLET UNDER THE TONGUE EVERY 5 MINUTES AS NEEDED FOR CHEST PAIN.  DO NOT EXCEED A TOTAL OF 3 DOSES IN 15 MINUTES  . simvastatin (ZOCOR) 40 MG tablet TAKE ONE TABLET BY MOUTH ONCE DAILY  . zolpidem (AMBIEN) 10 MG tablet TAKE ONE TABLET BY MOUTH AT BEDTIME AS NEEDED   Hypertension  This is a chronic problem. The current episode started more than 1 year ago. The problem is controlled. Pertinent negatives include no chest pain, palpitations, peripheral edema or shortness of breath. There are no associated agents to hypertension. Risk factors for coronary artery disease include dyslipidemia. Past treatments include calcium channel blockers and diuretics. The current treatment provides moderate improvement. Compliance problems include diet and  exercise.  There is no history of CAD/MI, heart failure or retinopathy.  Diabetes  She has type 2 diabetes mellitus. No MedicAlert identification noted. Her disease course has been stable. There are no hypoglycemic associated symptoms. There are no diabetic associated symptoms. Pertinent negatives for diabetes include no chest pain. There are no hypoglycemic complications. Pertinent negatives for diabetic complications include no retinopathy. Risk factors for coronary artery disease include hypertension. Current diabetic treatment includes oral agent (monotherapy). She is following a diabetic diet. When asked about meal planning, she reported none. She rarely participates in exercise. Home blood sugar record trend: does not check bkood sugars at home. An ACE inhibitor/angiotensin II receptor blocker is not being taken. She does not see a podiatrist.Eye exam is not current.  Hyperlipidemia  This is a chronic problem. Recent lipid tests were reviewed and are variable. Exacerbating diseases include diabetes. Pertinent negatives include no chest pain or shortness of breath. Current antihyperlipidemic treatment includes ezetimibe and statins. The current treatment provides mild improvement of lipids. Compliance problems include adherence to diet and adherence to exercise.  Risk factors for coronary artery disease include diabetes mellitus and hypertension.  GAD Buspar 1x a day- works well- no c/o side effects GAD 7 : Generalized Anxiety Score 09/23/2015  Nervous, Anxious, on Edge 2  Control/stop worrying 2  Worry too much - different things 1  Trouble relaxing 3  Restless 0  Easily annoyed or irritable 3  Afraid - awful might happen 2  Total GAD 7  Score 13  Anxiety Difficulty Somewhat difficult   Depression currenlty on prozac '20mg'$  BID- working well- had recent death in family but deal with it fairly well. Depression screen [facility] 2/9 09/23/2015 08/15/2015 10/11/2014 06/02/2014 01/14/2014  Decreased Interest  0 0 3 0 0  Down, Depressed, Hopeless 0 0 3 0 0  PHQ - 2 Score 0 0 6 0 0  Altered sleeping - - 1 - -  Tired, decreased energy - - 3 - -  Change in appetite - - 3 - -  Feeling bad or failure about yourself  - - 1 - -  Trouble concentrating - - 2 - -  Moving slowly or fidgety/restless - - 0 - -  Suicidal thoughts - - 0 - -  PHQ-9 Score - - 16 - -    Chronic low back pain Goes to pain management in [place]- hydrocodone 10/325 QID- helps but os always sin pain. She has a history of narcotic abuse. GERD nexium daily- keeps symptoms under control insomnia ambien nightly- patient says she cannot sleep without it. SHe has been out for several days. Pain clinic aware that she is taking.   Review of Systems  Constitutional: Negative.   HENT: Negative.   Respiratory: Negative for shortness of breath.   Cardiovascular: Negative for chest pain and palpitations.  Gastrointestinal: Negative.   Genitourinary: Negative.   Neurological: Negative.   Psychiatric/Behavioral: Negative.   All other systems reviewed and are negative.      Objective:   Physical Exam  Constitutional: She is oriented to person, place, and time. She appears well-developed and well-nourished.  HENT:  Nose: Nose normal.  Mouth/Throat: Oropharynx is clear and moist.  Eyes: EOM are normal.  Neck: Trachea normal, normal range of motion and full passive range of motion without pain. Neck supple. No JVD present. Carotid bruit is not present. No thyromegaly present.  Cardiovascular: Normal rate, regular rhythm, normal heart sounds and intact distal pulses.  Exam reveals no gallop and no friction rub.   No murmur heard. Pulmonary/Chest: Effort normal and breath sounds normal.  Abdominal: Soft. Bowel sounds are normal. She exhibits no distension and no mass. There is no tenderness.  Musculoskeletal: Normal range of motion.  Lymphadenopathy:    She has no cervical adenopathy.  Neurological: She is alert and oriented to  person, place, and time. She has normal reflexes.  Skin: Skin is warm and dry.  Psychiatric: She has a normal mood and affect. Her behavior is normal. Judgment and thought content normal.   BP 129/80   Pulse 88   Temp (!) 96.7 F (35.9 C) (Oral)   Ht '5\' 2"'$  (1.575 m)   Wt 218 lb (98.9 kg)   BMI 39.87 kg/m    HGBA1C 6.8%      Assessment & Plan:  1. Essential hypertension Do not add salt o diet - CMP14+EGFR - hydrochlorothiazide (HYDRODIURIL) 25 MG tablet; Take 1 tablet (25 mg total) by mouth daily.  Dispense: 90 tablet; Refill: 1 - amLODipine (NORVASC) 10 MG tablet; Take 1 tablet (10 mg total) by mouth daily.  Dispense: 90 tablet; Refill: 1  2. HLD (hyperlipidemia) Low fat diet - Lipid panel - ezetimibe (ZETIA) 10 MG tablet; TAKE ONE TABLET BY MOUTH ONCE DAILY (NEEDS TO BE SEEN BEFORE REFILLS)  Dispense: 90 tablet; Refill: 1 - simvastatin (ZOCOR) 40 MG tablet; Take 1 tablet (40 mg total) by mouth daily.  Dispense: 90 tablet; Refill: 1  3. Type 2 diabetes mellitus with diabetic  polyneuropathy, without long-term current use of insulin (HCC) Continue to watch carbs in diet - Bayer DCA Hb A1c Waived - Microalbumin / creatinine urine ratio - metFORMIN (GLUCOPHAGE) 500 MG tablet; Take 1 tablet (500 mg total) by mouth daily with breakfast.  Dispense: 90 tablet; Refill: 1  4. Obesity Discussed diet and exercise for person with BMI >25 Will recheck weight in 3-6 months  5. Depression Stress management - FLUoxetine (PROZAC) 20 MG capsule; Take 1 capsule (20 mg total) by mouth 2 (two) times daily.  Dispense: 180 capsule; Refill: 1  6. Tobacco user Smoking cessation encouraged  7. Midline low back pain without sciatica Continue to go to pain clinic  8. Narcotic abuse Continue [facility]  9. Insomnia Bedtime routine - zolpidem (AMBIEN) 10 MG tablet; Take 1 tablet (10 mg total) by mouth at bedtime as needed.  Dispense: 30 tablet; Refill: 2  10. Gastroesophageal reflux  disease without esophagitis Avoid spicy foods Do not eat 2 hours prior to bedtime  - NEXIUM 40 MG capsule; Take 1 capsule (40 mg total) by mouth daily.  Dispense: 90 capsule; Refill: 1  11. GAD (generalized anxiety disorder) - busPIRone (BUSPAR) 7.5 MG tablet; Take 1 tablet (7.5 mg total) by mouth daily.  Dispense: 30 tablet; Refill: 5   Patient will schedule PA and eye exam Patient will schedule mammogram Labs pending Health maintenance reviewed Diet and exercise encouraged Continue all meds Follow up  In 3 months   [place], FNP

## 2015-09-24 LAB — LIPID PANEL
CHOL/HDL RATIO: 3.3 ratio (ref 0.0–4.4)
Cholesterol, Total: 125 mg/dL (ref 100–199)
HDL: 38 mg/dL — AB (ref >39)
LDL CALC: 43 mg/dL (ref 0–99)
TRIGLYCERIDES: 218 mg/dL — AB (ref 0–149)
VLDL CHOLESTEROL CAL: 44 mg/dL — AB (ref 5–40)

## 2015-09-24 LAB — CMP14+EGFR
A/G RATIO: 1.6 (ref 1.2–2.2)
ALK PHOS: 63 IU/L (ref 39–117)
ALT: 23 IU/L (ref 0–32)
AST: 18 IU/L (ref 0–40)
Albumin: 4.2 g/dL (ref 3.5–5.5)
BUN/Creatinine Ratio: 23 (ref 9–23)
BUN: 11 mg/dL (ref 6–24)
CO2: 22 mmol/L (ref 18–29)
Calcium: 9.1 mg/dL (ref 8.7–10.2)
Chloride: 99 mmol/L (ref 96–106)
Creatinine, Ser: 0.48 mg/dL — ABNORMAL LOW (ref 0.57–1.00)
GFR calc non Af Amer: 116 mL/min/{1.73_m2} (ref >59)
GFR, EST AFRICAN AMERICAN: 134 mL/min/{1.73_m2} (ref >59)
GLUCOSE: 84 mg/dL (ref 65–99)
Globulin, Total: 2.7 g/dL (ref 1.5–4.5)
POTASSIUM: 4.1 mmol/L (ref 3.5–5.2)
Sodium: 140 mmol/L (ref 134–144)
TOTAL PROTEIN: 6.9 g/dL (ref 6.0–8.5)

## 2015-09-26 ENCOUNTER — Telehealth: Payer: Self-pay | Admitting: Nurse Practitioner

## 2015-09-27 ENCOUNTER — Telehealth: Payer: Self-pay

## 2015-09-27 NOTE — Telephone Encounter (Signed)
Patient aware of labs.  

## 2015-09-29 NOTE — Telephone Encounter (Signed)
Patient cannot take these because increases her pain

## 2015-09-29 NOTE — Telephone Encounter (Signed)
Madison look at this note and respond

## 2015-10-04 ENCOUNTER — Telehealth: Payer: Self-pay

## 2015-10-06 NOTE — Telephone Encounter (Signed)
x

## 2015-10-11 ENCOUNTER — Telehealth: Payer: Self-pay | Admitting: Nurse Practitioner

## 2015-10-11 NOTE — Telephone Encounter (Signed)
Called pt back, she is on 2 BP meds, she has been taking them. Made appt for a nurse visit to recheck BP in the morning. Instructed pt if she felt worse over the evening there is a provider on call or go to the nearest ED

## 2015-10-12 ENCOUNTER — Telehealth: Payer: Self-pay | Admitting: Nurse Practitioner

## 2015-10-12 NOTE — Telephone Encounter (Signed)
Tried contacting patient to see what BP reading was at [facility].  NO Answer

## 2015-12-01 ENCOUNTER — Ambulatory Visit: Payer: Medicaid Other | Admitting: Family Medicine

## 2015-12-03 ENCOUNTER — Other Ambulatory Visit: Payer: Self-pay | Admitting: *Deleted

## 2015-12-03 ENCOUNTER — Telehealth: Payer: Self-pay | Admitting: Nurse Practitioner

## 2015-12-03 MED ORDER — PERMETHRIN 1 % EX LOTN: 1.0000 "application " | TOPICAL_LOTION | Freq: Once | CUTANEOUS | 0 refills | Status: DC

## 2015-12-03 MED ORDER — PERMETHRIN 5 % EX CREA: 1.0000 "application " | TOPICAL_CREAM | Freq: Once | CUTANEOUS | 0 refills | Status: DC

## 2015-12-03 NOTE — Telephone Encounter (Signed)
Dr Dettinger ok'd rx so rx sent to pharmacy and pt is aware.

## 2015-12-07 ENCOUNTER — Other Ambulatory Visit: Payer: Self-pay | Admitting: Nurse Practitioner

## 2015-12-07 ENCOUNTER — Telehealth: Payer: Self-pay | Admitting: Nurse Practitioner

## 2015-12-07 DIAGNOSIS — F17200 Nicotine dependence, unspecified, uncomplicated: Secondary | ICD-10-CM

## 2015-12-07 MED ORDER — NICOTINE 21 MG/24HR TD PT24: 21.0000 mg | MEDICATED_PATCH | Freq: Every day | TRANSDERMAL | 0 refills | Status: DC

## 2015-12-07 NOTE — Telephone Encounter (Signed)
Rx sent in

## 2015-12-07 NOTE — Telephone Encounter (Signed)
Patient of MMM. Please advise and route to [facility] 

## 2015-12-08 NOTE — Telephone Encounter (Signed)
Message left for patient , script was sent to [facility].

## 2015-12-16 ENCOUNTER — Telehealth: Payer: Self-pay | Admitting: Nurse Practitioner

## 2015-12-19 ENCOUNTER — Other Ambulatory Visit: Payer: Self-pay | Admitting: Nurse Practitioner

## 2015-12-19 ENCOUNTER — Other Ambulatory Visit: Payer: Self-pay | Admitting: *Deleted

## 2015-12-19 MED ORDER — PERMETHRIN 5 % EX CREA: 1.0000 "application " | TOPICAL_CREAM | Freq: Once | CUTANEOUS | 0 refills | Status: AC

## 2015-12-19 NOTE — Telephone Encounter (Signed)
Spoke with pt She was told to repeat lice treatment in a week Needs refill on lice treatment Please advise

## 2015-12-19 NOTE — Progress Notes (Signed)
rx sent into [facility] Okayed per MMM

## 2015-12-19 NOTE — Telephone Encounter (Signed)
Please refill what was ordered previously - cannot find in chart

## 2015-12-20 ENCOUNTER — Other Ambulatory Visit: Payer: Self-pay | Admitting: Nurse Practitioner

## 2015-12-20 MED ORDER — ZOLPIDEM TARTRATE 10 MG PO TABS: 10.0000 mg | ORAL_TABLET | Freq: Every evening | ORAL | 1 refills | Status: DC | PRN

## 2015-12-20 NOTE — Telephone Encounter (Signed)
Please call in ambien with 1 refills 

## 2015-12-20 NOTE — Telephone Encounter (Signed)
Last filled 11/24/15, last seen 09/23/15. Call in

## 2015-12-20 NOTE — Telephone Encounter (Signed)
Refill called to [facility] 

## 2016-01-05 ENCOUNTER — Encounter (HOSPITAL_COMMUNITY): Payer: Self-pay

## 2016-01-05 ENCOUNTER — Emergency Department (HOSPITAL_COMMUNITY)
Admission: EM | Admit: 2016-01-05 | Discharge: 2016-01-05 | Disposition: A | Discharge status: Home / Self Care | Payer: No Typology Code available for payment source | Attending: Emergency Medicine | Admitting: Emergency Medicine

## 2016-01-05 DIAGNOSIS — J449 Chronic obstructive pulmonary disease, unspecified: Secondary | ICD-10-CM | POA: Insufficient documentation

## 2016-01-05 DIAGNOSIS — F17210 Nicotine dependence, cigarettes, uncomplicated: Secondary | ICD-10-CM | POA: Diagnosis not present

## 2016-01-05 DIAGNOSIS — Y999 Unspecified external cause status: Secondary | ICD-10-CM | POA: Insufficient documentation

## 2016-01-05 DIAGNOSIS — Z8673 Personal history of transient ischemic attack (TIA), and cerebral infarction without residual deficits: Secondary | ICD-10-CM | POA: Diagnosis not present

## 2016-01-05 DIAGNOSIS — Z7984 Long term (current) use of oral hypoglycemic drugs: Secondary | ICD-10-CM | POA: Insufficient documentation

## 2016-01-05 DIAGNOSIS — Z791 Long term (current) use of non-steroidal anti-inflammatories (NSAID): Secondary | ICD-10-CM | POA: Diagnosis not present

## 2016-01-05 DIAGNOSIS — S199XXA Unspecified injury of neck, initial encounter: Secondary | ICD-10-CM | POA: Diagnosis present

## 2016-01-05 DIAGNOSIS — Y939 Activity, unspecified: Secondary | ICD-10-CM | POA: Insufficient documentation

## 2016-01-05 DIAGNOSIS — Z79899 Other long term (current) drug therapy: Secondary | ICD-10-CM | POA: Insufficient documentation

## 2016-01-05 DIAGNOSIS — I2510 Atherosclerotic heart disease of native coronary artery without angina pectoris: Secondary | ICD-10-CM | POA: Diagnosis not present

## 2016-01-05 DIAGNOSIS — S39012A Strain of muscle, fascia and tendon of lower back, initial encounter: Secondary | ICD-10-CM | POA: Insufficient documentation

## 2016-01-05 DIAGNOSIS — I10 Essential (primary) hypertension: Secondary | ICD-10-CM | POA: Insufficient documentation

## 2016-01-05 DIAGNOSIS — Z7982 Long term (current) use of aspirin: Secondary | ICD-10-CM | POA: Insufficient documentation

## 2016-01-05 DIAGNOSIS — Y92410 Unspecified street and highway as the place of occurrence of the external cause: Secondary | ICD-10-CM | POA: Insufficient documentation

## 2016-01-05 DIAGNOSIS — S161XXA Strain of muscle, fascia and tendon at neck level, initial encounter: Secondary | ICD-10-CM | POA: Diagnosis not present

## 2016-01-05 MED ORDER — KETOROLAC TROMETHAMINE 10 MG PO TABS
10.0000 mg | ORAL_TABLET | Freq: Once | ORAL | Status: AC
  Administered 2016-01-05: 10 mg via ORAL
  Filled 2016-01-05: qty 1

## 2016-01-05 MED ORDER — DIAZEPAM 5 MG PO TABS
10.0000 mg | ORAL_TABLET | Freq: Once | ORAL | Status: AC
  Administered 2016-01-05: 10 mg via ORAL
  Filled 2016-01-05: qty 2

## 2016-01-05 MED ORDER — CYCLOBENZAPRINE HCL 10 MG PO TABS: 10.0000 mg | ORAL_TABLET | Freq: Three times a day (TID) | ORAL | 0 refills | Status: AC

## 2016-01-05 MED ORDER — DICLOFENAC SODIUM 75 MG PO TBEC: 75.0000 mg | DELAYED_RELEASE_TABLET | Freq: Two times a day (BID) | ORAL | 0 refills | Status: DC

## 2016-01-05 MED ORDER — HYDROCODONE-ACETAMINOPHEN 5-325 MG PO TABS
2.0000 | ORAL_TABLET | Freq: Once | ORAL | Status: DC
  Filled 2016-01-05: qty 2
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
## 2016-01-05 NOTE — ED Notes (Signed)
Patient states "I'm shaking because I haven't eaten." Gave patient crackers and peanut butter and sprite to drink.
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
## 2016-01-05 NOTE — ED Notes (Signed)
Called to place patient in room, no answer. 
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
## 2016-01-16 ENCOUNTER — Ambulatory Visit: Payer: Medicaid Other | Admitting: Nurse Practitioner

## 2016-01-17 ENCOUNTER — Encounter: Payer: Self-pay | Admitting: Nurse Practitioner

## 2016-01-17 ENCOUNTER — Ambulatory Visit: Payer: Medicaid Other | Admitting: Nurse Practitioner

## 2016-01-18 ENCOUNTER — Encounter: Payer: Self-pay | Admitting: Nurse Practitioner

## 2016-02-02 ENCOUNTER — Ambulatory Visit (INDEPENDENT_AMBULATORY_CARE_PROVIDER_SITE_OTHER): Payer: Medicaid Other | Admitting: Family Medicine

## 2016-02-02 ENCOUNTER — Encounter: Payer: Self-pay | Admitting: Family Medicine

## 2016-02-02 ENCOUNTER — Ambulatory Visit: Payer: Medicaid Other | Admitting: Nurse Practitioner

## 2016-02-02 VITALS — BP 131/76 | HR 99 | Temp 97.1°F | Ht 62.0 in | Wt 216.0 lb

## 2016-02-02 DIAGNOSIS — E785 Hyperlipidemia, unspecified: Secondary | ICD-10-CM | POA: Diagnosis not present

## 2016-02-02 DIAGNOSIS — S3992XA Unspecified injury of lower back, initial encounter: Secondary | ICD-10-CM

## 2016-02-02 DIAGNOSIS — S3992XD Unspecified injury of lower back, subsequent encounter: Secondary | ICD-10-CM

## 2016-02-02 MED ORDER — NICOTINE 21 MG/24HR TD PT24: MEDICATED_PATCH | TRANSDERMAL | 0 refills | Status: DC

## 2016-02-02 MED ORDER — PREDNISONE 10 MG (48) PO TBPK: ORAL_TABLET | Freq: Every day | ORAL | 0 refills | Status: DC

## 2016-02-02 MED ORDER — EZETIMIBE 10 MG PO TABS: ORAL_TABLET | ORAL | 1 refills | Status: DC
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
## 2016-02-09 ENCOUNTER — Ambulatory Visit: Payer: Medicaid Other | Admitting: Nurse Practitioner

## 2016-02-13 ENCOUNTER — Other Ambulatory Visit: Payer: Self-pay | Admitting: Nurse Practitioner

## 2016-02-13 NOTE — Telephone Encounter (Signed)
Last refill without being seen 

## 2016-02-13 NOTE — Telephone Encounter (Signed)
Please call in ambien  with 0 refills 

## 2016-02-13 NOTE — Telephone Encounter (Signed)
Last filled 01/17/16, last seen 09/23/15. Route to pool for call in

## 2016-02-14 ENCOUNTER — Other Ambulatory Visit: Payer: Self-pay | Admitting: Nurse Practitioner

## 2016-02-14 NOTE — Telephone Encounter (Signed)
Pt aware refill sent but NTBS before next refill

## 2016-02-14 NOTE — Telephone Encounter (Signed)
Refill called to [facility] 

## 2016-02-16 ENCOUNTER — Ambulatory Visit: Payer: Medicaid Other | Admitting: Family Medicine
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
## 2016-02-22 ENCOUNTER — Ambulatory Visit (INDEPENDENT_AMBULATORY_CARE_PROVIDER_SITE_OTHER): Payer: Medicaid Other | Admitting: Family Medicine

## 2016-02-22 ENCOUNTER — Encounter: Payer: Self-pay | Admitting: Family Medicine

## 2016-02-22 VITALS — BP 136/84 | HR 97 | Temp 97.0°F | Ht 62.0 in | Wt 214.4 lb

## 2016-02-22 DIAGNOSIS — M5441 Lumbago with sciatica, right side: Secondary | ICD-10-CM

## 2016-02-22 DIAGNOSIS — G8929 Other chronic pain: Secondary | ICD-10-CM | POA: Diagnosis not present

## 2016-03-12 ENCOUNTER — Other Ambulatory Visit: Payer: Self-pay | Admitting: Nurse Practitioner

## 2016-03-14 NOTE — Telephone Encounter (Signed)
NTBS for ambien rx 

## 2016-03-14 NOTE — Telephone Encounter (Signed)
Last filled 02/14/2016. Last seen for follow up 09/23/2015. If approved please route to pool and have nurse call into pharmacy.

## 2016-03-15 ENCOUNTER — Other Ambulatory Visit: Payer: Self-pay | Admitting: Nurse Practitioner

## 2016-03-15 NOTE — Telephone Encounter (Signed)
Last filled 02/14/16, last saw you 09/23/15. Call in

## 2016-03-16 NOTE — Telephone Encounter (Signed)
ntbs for ambien refill

## 2016-03-16 NOTE — Telephone Encounter (Signed)
Patient aware that she will need to be seen  

## 2016-03-21 ENCOUNTER — Ambulatory Visit: Payer: Medicaid Other | Admitting: Family Medicine

## 2016-03-22 ENCOUNTER — Ambulatory Visit: Payer: Medicaid Other | Admitting: Nurse Practitioner

## 2016-03-22 ENCOUNTER — Telehealth: Payer: Self-pay | Admitting: Nurse Practitioner

## 2016-03-22 NOTE — Telephone Encounter (Signed)
rx called into pharmacy and left detailed message stating rx is at pharmacy and to call back with any questions or concerns.

## 2016-03-22 NOTE — Telephone Encounter (Signed)
rx was suppose to have been called in oin January 18- lease check with pharmacy

## 2016-03-22 NOTE — Telephone Encounter (Signed)
Please review and advise.

## 2016-03-24 ENCOUNTER — Other Ambulatory Visit: Payer: Self-pay | Admitting: Nurse Practitioner

## 2016-03-24 DIAGNOSIS — F329 Major depressive disorder, single episode, unspecified: Secondary | ICD-10-CM

## 2016-03-24 DIAGNOSIS — F32A Depression, unspecified: Secondary | ICD-10-CM

## 2016-03-26 ENCOUNTER — Encounter: Payer: Self-pay | Admitting: Nurse Practitioner

## 2016-03-29 ENCOUNTER — Ambulatory Visit: Payer: Medicaid Other | Admitting: Family Medicine

## 2016-04-04 ENCOUNTER — Ambulatory Visit (INDEPENDENT_AMBULATORY_CARE_PROVIDER_SITE_OTHER): Payer: Medicaid Other | Admitting: Family Medicine

## 2016-04-04 ENCOUNTER — Encounter: Payer: Self-pay | Admitting: Family Medicine

## 2016-04-04 ENCOUNTER — Telehealth: Payer: Self-pay | Admitting: Family Medicine

## 2016-04-04 VITALS — BP 120/74 | HR 97 | Temp 97.7°F | Ht 62.0 in | Wt 217.2 lb

## 2016-04-04 DIAGNOSIS — I10 Essential (primary) hypertension: Secondary | ICD-10-CM

## 2016-04-04 DIAGNOSIS — K047 Periapical abscess without sinus: Secondary | ICD-10-CM | POA: Diagnosis not present

## 2016-04-04 DIAGNOSIS — M545 Low back pain: Secondary | ICD-10-CM

## 2016-04-04 DIAGNOSIS — E1142 Type 2 diabetes mellitus with diabetic polyneuropathy: Secondary | ICD-10-CM

## 2016-04-04 DIAGNOSIS — G8929 Other chronic pain: Secondary | ICD-10-CM | POA: Diagnosis not present

## 2016-04-04 LAB — BAYER DCA HB A1C WAIVED: HB A1C: 7.5 % — AB (ref <7.0)

## 2016-04-04 MED ORDER — PENICILLIN V POTASSIUM 500 MG PO TABS: 500.0000 mg | ORAL_TABLET | Freq: Two times a day (BID) | ORAL | 0 refills | Status: DC

## 2016-04-04 MED ORDER — FLUCONAZOLE 150 MG PO TABS: 150.0000 mg | ORAL_TABLET | Freq: Once | ORAL | 0 refills | Status: AC

## 2016-04-04 MED ORDER — PREDNISONE 10 MG (21) PO TBPK: 10.0000 mg | ORAL_TABLET | Freq: Every day | ORAL | 0 refills | Status: DC

## 2016-04-04 MED ORDER — ZOLPIDEM TARTRATE 10 MG PO TABS: ORAL_TABLET | ORAL | 1 refills | Status: DC

## 2016-04-04 MED ORDER — PENICILLIN V POTASSIUM 250 MG PO TABS: 500.0000 mg | ORAL_TABLET | Freq: Three times a day (TID) | ORAL | Status: DC

## 2016-04-04 NOTE — Telephone Encounter (Signed)
Instructed [facility] to use instructions on inside of prednisone package
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
## 2016-04-05 LAB — MICROALBUMIN / CREATININE URINE RATIO: Creatinine, Urine: 53.6 mg/dL

## 2016-04-10 IMAGING — CR DG LUMBAR SPINE COMPLETE 4+V
5 series · 5 of 5 positions shown · non-contrast
Comparison: 09/22/2012

CLINICAL DATA: Back pain for 1-2 years, leukocytosis, pain
radiating to RIGHT hip, no prior surgery

EXAM:
LUMBAR SPINE - COMPLETE 4+ VIEW

[view not recorded (1 of 5)]
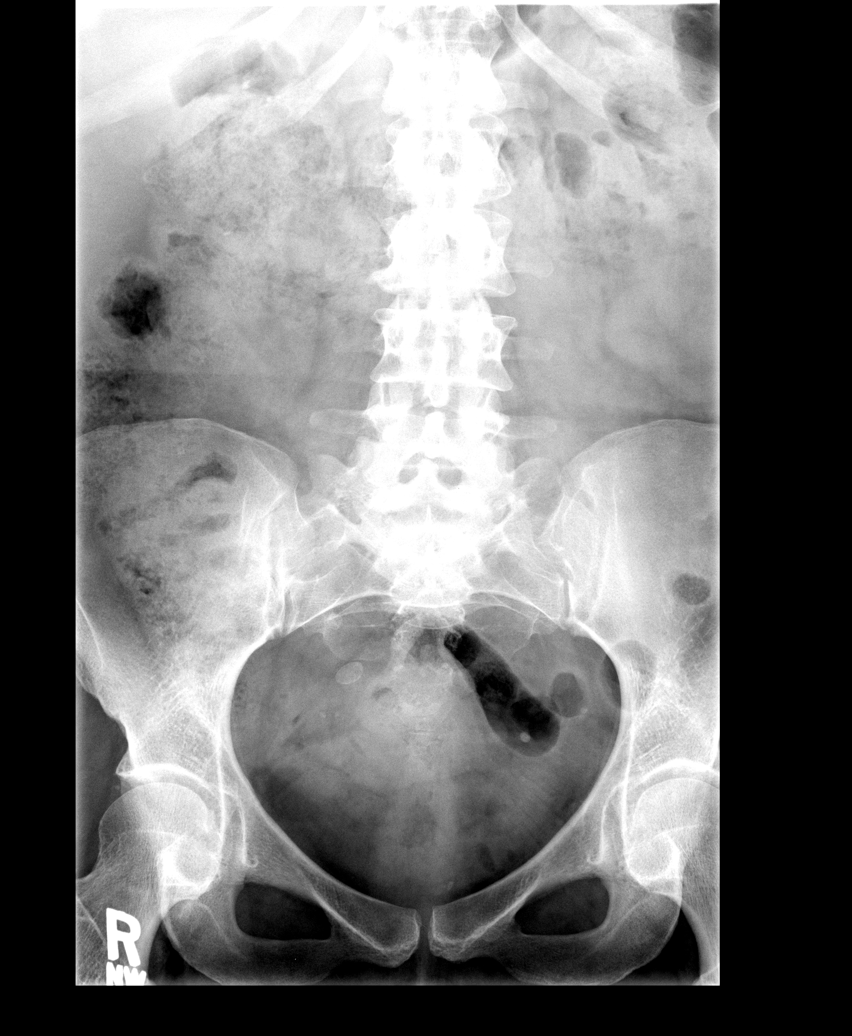

[view not recorded (2 of 5)]
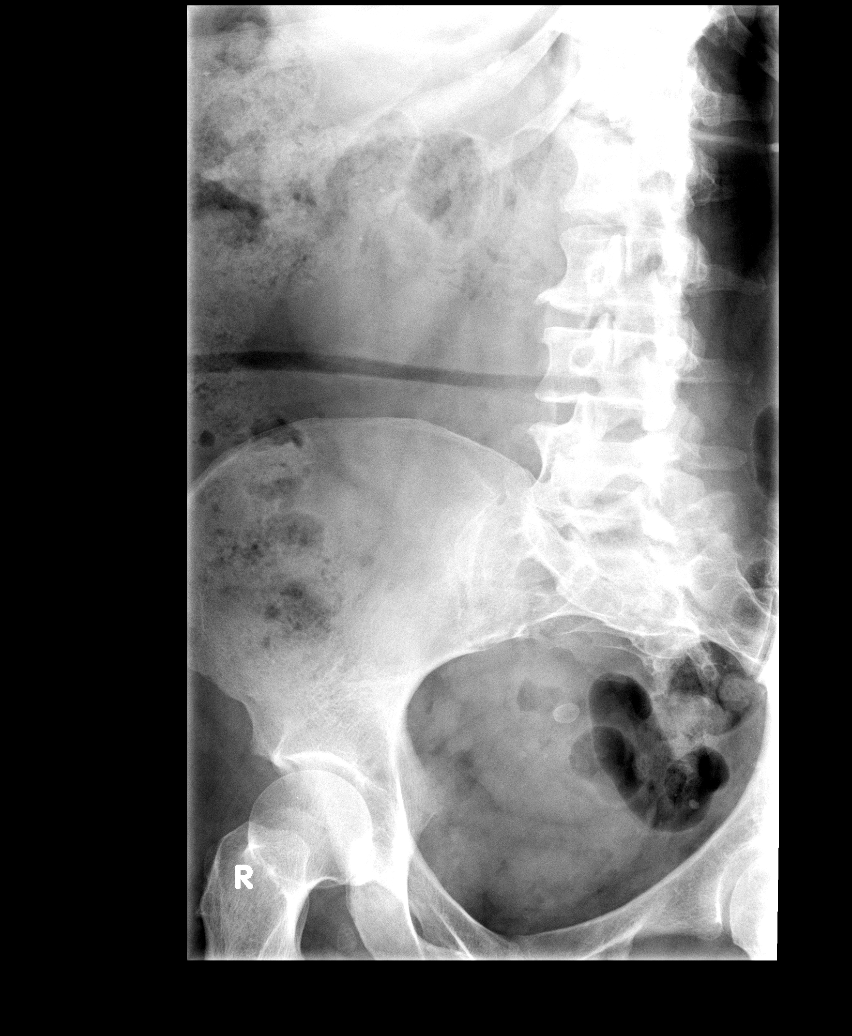

[view not recorded (3 of 5)]
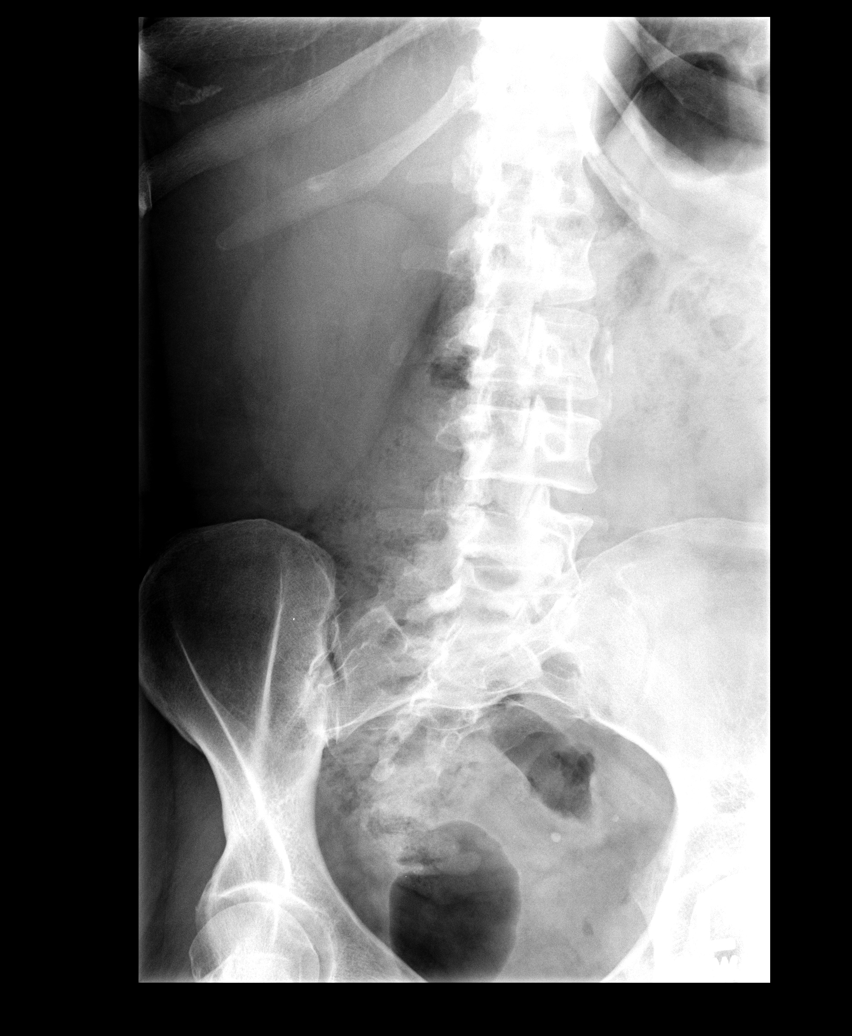

[view not recorded (4 of 5)]
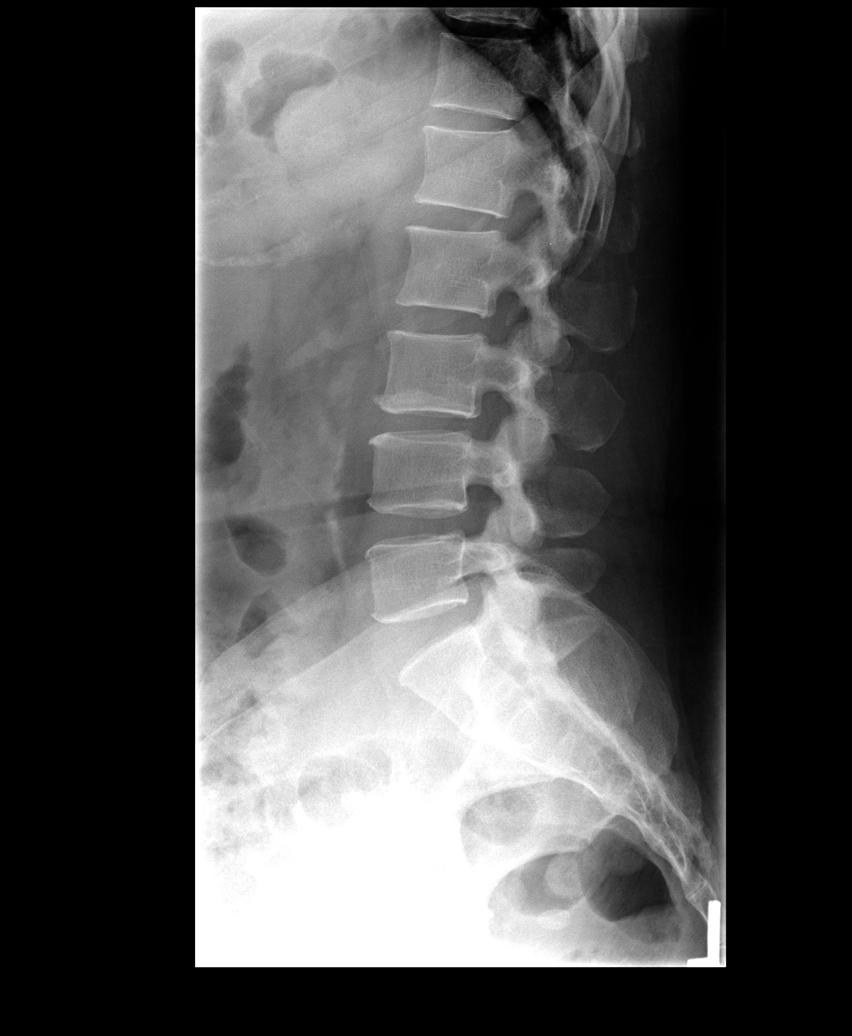

[view not recorded (5 of 5)]
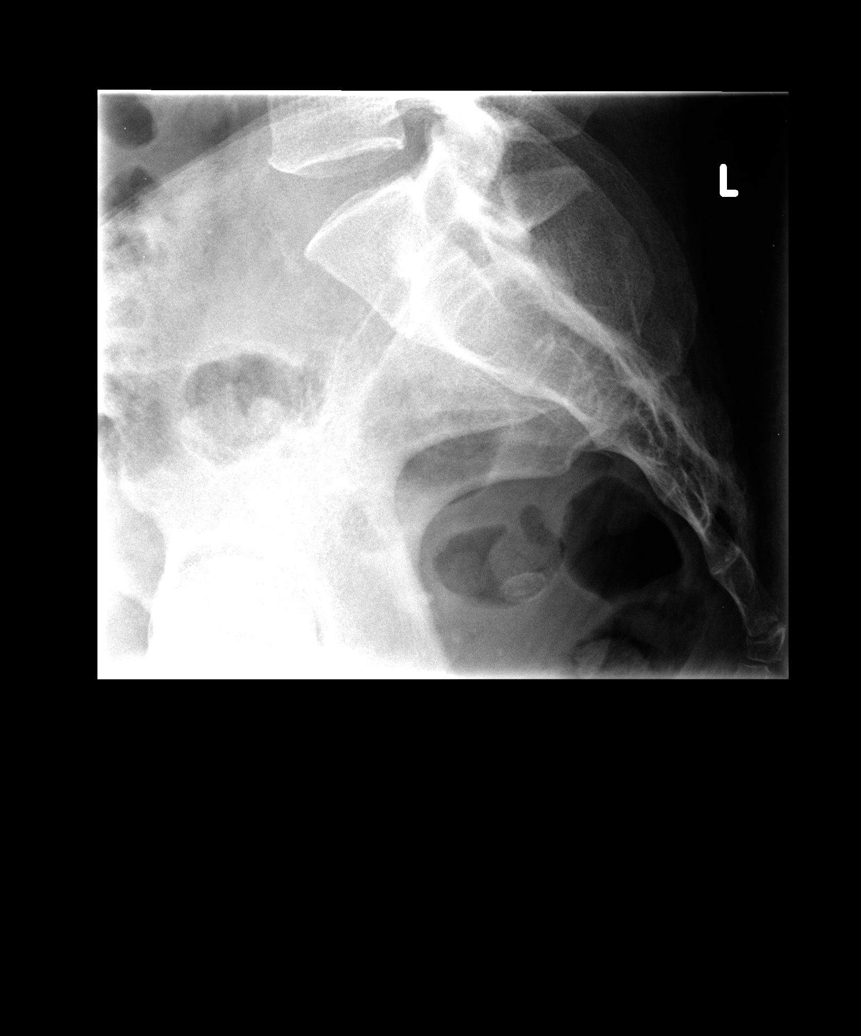

[5 of 5 positions shown; findings below may reference images not displayed]

FINDINGS: Vertebral body heights maintained without fracture or subluxation.

5 non-rib-bearing lumbar vertebrae.

Disc space heights fairly well maintained, minimal disc space
narrowing and endplate spur formation noted at L3-L4.

No spondylolysis.

SI joints symmetric.

Scattered atherosclerotic calcification.
IMPRESSION: Minimal degenerative disc disease changes at L3-L4.

No acute abnormalities.

## 2016-04-16 ENCOUNTER — Other Ambulatory Visit: Payer: Self-pay | Admitting: Nurse Practitioner

## 2016-04-16 DIAGNOSIS — I10 Essential (primary) hypertension: Secondary | ICD-10-CM

## 2016-04-21 ENCOUNTER — Other Ambulatory Visit: Payer: Self-pay | Admitting: Nurse Practitioner

## 2016-04-23 ENCOUNTER — Telehealth: Payer: Self-pay | Admitting: Nurse Practitioner

## 2016-04-23 NOTE — Telephone Encounter (Signed)
Pt notified of RX 

## 2016-04-23 NOTE — Telephone Encounter (Signed)
Seen miller last 03/2016. Coverage is stacks. If approved - please route to nurse for phone in

## 2016-04-24 NOTE — Telephone Encounter (Signed)
Phoned in.

## 2016-04-26 ENCOUNTER — Other Ambulatory Visit: Payer: Self-pay | Admitting: Pediatrics

## 2016-05-28 ENCOUNTER — Other Ambulatory Visit: Payer: Self-pay | Admitting: Nurse Practitioner

## 2016-05-28 ENCOUNTER — Other Ambulatory Visit: Payer: Self-pay | Admitting: Pediatrics

## 2016-05-28 ENCOUNTER — Telehealth: Payer: Self-pay | Admitting: Nurse Practitioner

## 2016-05-28 NOTE — Telephone Encounter (Signed)
LMTCB after checking with her insurance as to which glucose meter her insurance will pay for

## 2016-05-30 ENCOUNTER — Telehealth: Payer: Self-pay | Admitting: Nurse Practitioner

## 2016-05-30 DIAGNOSIS — E119 Type 2 diabetes mellitus without complications: Secondary | ICD-10-CM

## 2016-05-30 NOTE — Telephone Encounter (Signed)
What is the name of the medication? accu chek  Have you contacted your pharmacy to request a refill? yes  Which pharmacy would you [facility] sent [facility]   Patient notified that their request is being sent to the clinical staff for review and that they should receive a call once it is complete. If they do not receive a call within 24 hours they can check with their pharmacy or our office.

## 2016-05-31 ENCOUNTER — Telehealth: Payer: Self-pay | Admitting: Nurse Practitioner

## 2016-05-31 ENCOUNTER — Other Ambulatory Visit: Payer: Self-pay

## 2016-05-31 MED ORDER — GLUCOSE BLOOD VI STRP: ORAL_STRIP | 3 refills | Status: DC

## 2016-05-31 MED ORDER — BLOOD GLUCOSE MONITORING SUPPL MISC: 0 refills | Status: DC

## 2016-05-31 MED ORDER — ONETOUCH ULTRASOFT LANCETS MISC: 12 refills | Status: DC

## 2016-05-31 MED ORDER — ACCU-CHEK SOFT TOUCH LANCETS MISC: 3 refills | Status: DC

## 2016-05-31 MED ORDER — GLUCOSE BLOOD VI STRP: ORAL_STRIP | 12 refills | Status: DC

## 2016-05-31 NOTE — Telephone Encounter (Signed)
Pt notified to pick up meter here RX for strips and lancets sent into [facility]

## 2016-05-31 NOTE — Telephone Encounter (Signed)
Madison Gentry talked to the pharmacy

## 2016-06-07 ENCOUNTER — Encounter: Payer: Self-pay | Admitting: Nurse Practitioner

## 2016-06-07 ENCOUNTER — Ambulatory Visit (INDEPENDENT_AMBULATORY_CARE_PROVIDER_SITE_OTHER): Payer: Medicaid Other | Admitting: Nurse Practitioner

## 2016-06-07 VITALS — BP 135/85 | HR 89 | Temp 96.8°F | Ht 62.0 in | Wt 212.0 lb

## 2016-06-07 DIAGNOSIS — J209 Acute bronchitis, unspecified: Secondary | ICD-10-CM

## 2016-06-07 DIAGNOSIS — R509 Fever, unspecified: Secondary | ICD-10-CM

## 2016-06-07 LAB — VERITOR FLU A/B WAIVED
Influenza A: NEGATIVE
Influenza B: NEGATIVE

## 2016-06-07 MED ORDER — HYDROCODONE-HOMATROPINE 5-1.5 MG/5ML PO SYRP: 5.0000 mL | ORAL_SOLUTION | Freq: Four times a day (QID) | ORAL | 0 refills | Status: DC | PRN

## 2016-06-07 MED ORDER — AMOXICILLIN 875 MG PO TABS
875.0000 mg | ORAL_TABLET | Freq: Two times a day (BID) | ORAL | 0 refills | Status: DC
Start: 2016-06-07 — End: 2016-07-17

## 2016-06-07 NOTE — Progress Notes (Signed)
Subjective:     Madison Gentry is a 51 y.o. female here for evaluation of a cough. Onset of symptoms was 6 days ago. Symptoms have been gradually worsening since that time. The cough is barky, dry and harsh and is aggravated by nothing. Associated symptoms include: change in voice, postnasal drip and sputum production. Patient does have a history of asthma. Patient does have a history of environmental allergens. Patient has not traveled recently. Patient does have a history of smoking. Patient has had a previous chest x-ray. Patient has not had a PPD done.  The following portions of the patient's history were reviewed and updated as appropriate: allergies, current medications, past family history, past medical history, past social history, past surgical history and problem list.  Review of Systems Pertinent items noted in HPI and remainder of comprehensive ROS otherwise negative.    Objective:     BP 135/85   Pulse 89   Temp (!) 96.8 F (36 C) (Oral)   Ht  (1.575 m)   Wt 212 lb (96.2 kg)   BMI 38.78 kg/m  General appearance: alert and cooperative Eyes: conjunctivae/corneas clear. PERRL, EOM's intact. Fundi benign. Ears: normal TM's and external ear canals both ears Nose: clear discharge, mild congestion, turbinates pale Throat: lips, mucosa, and tongue normal; teeth and gums normal Neck: no adenopathy, no carotid bruit, no JVD, supple, symmetrical, trachea midline and thyroid not enlarged, symmetric, no tenderness/mass/nodules Lungs: clear to auscultation bilaterally and dry hacky cough Heart: regular rate and rhythm, S1, S2 normal, no murmur, click, rub or gallop    Assessment:    Acute Bronchitis    Plan:     1. Take meds as prescribed 2. Use a cool mist humidifier especially during the winter months and when heat has been humid. 3. Use saline nose sprays frequently 4. Saline irrigations of the nose can be very helpful if done frequently.  * 4X daily for 1 week*  * Use of a  nettie pot can be helpful with this. Follow directions with this* 5. Drink plenty of fluids 6. Keep thermostat turn down low 7.For any cough or congestion  Use plain Mucinex- regular strength or max strength is fine   * Children- consult with Pharmacist for dosing 8. For fever or aces or pains- take tylenol or ibuprofen appropriate for age and weight.  * for fevers greater than 101 orally you may alternate ibuprofen and tylenol every  3 hours.   Meds ordered this encounter  Medications  . amoxicillin (AMOXIL) 875 MG tablet    Sig: Take 1 tablet (875 mg total) by mouth 2 (two) times daily. 1 po BID    Dispense:  20 tablet    Refill:  0    Order Specific Question:   Supervising Provider    Answer:   VINCENT, CAROL L [[id number]]  . HYDROcodone-homatropine (HYCODAN) 5-1.5 MG/5ML syrup    Sig: Take 5 mLs by mouth every 6 (six) hours as needed for cough.    Dispense:  120 mL    Refill:  0    Order Specific Question:   Supervising Provider    Answer:   Johna Sheriff [[id number]]   Mary-Margaret Daphine Deutscher, FNP

## 2016-06-07 NOTE — Patient Instructions (Signed)

## 2016-06-14 ENCOUNTER — Other Ambulatory Visit: Payer: Self-pay | Admitting: Family Medicine

## 2016-06-15 NOTE — Telephone Encounter (Signed)
Patient aware of results and verbalizes understanding.  

## 2016-06-15 NOTE — Telephone Encounter (Signed)
Please call in ambien  with 0 refills 

## 2016-06-27 ENCOUNTER — Other Ambulatory Visit: Payer: Self-pay | Admitting: Family Medicine

## 2016-06-27 DIAGNOSIS — F32A Depression, unspecified: Secondary | ICD-10-CM

## 2016-06-27 DIAGNOSIS — F329 Major depressive disorder, single episode, unspecified: Secondary | ICD-10-CM

## 2016-06-28 ENCOUNTER — Other Ambulatory Visit: Payer: Self-pay | Admitting: Nurse Practitioner

## 2016-07-10 ENCOUNTER — Telehealth: Payer: Self-pay

## 2016-07-10 NOTE — Telephone Encounter (Signed)
Why can she not just use depends at night

## 2016-07-10 NOTE — Telephone Encounter (Signed)
Left detailed message regarding recommendation. 

## 2016-07-11 ENCOUNTER — Telehealth: Payer: Self-pay | Admitting: Nurse Practitioner

## 2016-07-11 NOTE — Telephone Encounter (Signed)
Please ask guna what I can orderin computer

## 2016-07-11 NOTE — Telephone Encounter (Signed)
Please advise 

## 2016-07-13 NOTE — Telephone Encounter (Signed)
[facility]

## 2016-07-13 NOTE — Telephone Encounter (Signed)
Handwritten Rx given to referral coordinator.   Murtis SinkSam Xyla Leisner, MD [facility] 07/13/2016, 12:54 PM

## 2016-07-16 ENCOUNTER — Telehealth: Payer: Self-pay | Admitting: Nurse Practitioner

## 2016-07-17 ENCOUNTER — Telehealth: Payer: Self-pay | Admitting: Nurse Practitioner

## 2016-07-17 ENCOUNTER — Encounter: Payer: Self-pay | Admitting: Nurse Practitioner

## 2016-07-17 ENCOUNTER — Ambulatory Visit (INDEPENDENT_AMBULATORY_CARE_PROVIDER_SITE_OTHER): Payer: Medicaid Other | Admitting: Nurse Practitioner

## 2016-07-17 VITALS — BP 137/82 | HR 101 | Temp 97.3°F | Ht 62.0 in | Wt 211.0 lb

## 2016-07-17 DIAGNOSIS — R4586 Emotional lability: Secondary | ICD-10-CM

## 2016-07-17 DIAGNOSIS — N926 Irregular menstruation, unspecified: Secondary | ICD-10-CM

## 2016-07-17 NOTE — Patient Instructions (Signed)
Menopause Menopause is the normal time of life when menstrual periods stop completely. Menopause is complete when you have missed 12 consecutive menstrual periods. It usually occurs between the ages of 48 years and 55 years. Very rarely does a woman develop menopause before the age of 40 years. At menopause, your ovaries stop producing the female hormones estrogen and progesterone. This can cause undesirable symptoms and also affect your health. Sometimes the symptoms may occur 4-5 years before the menopause begins. There is no relationship between menopause and:  Oral contraceptives.  Number of children you had.  Race.  The age your menstrual periods started (menarche).  Heavy smokers and very thin women may develop menopause earlier in life. What are the causes?  The ovaries stop producing the female hormones estrogen and progesterone. Other causes include:  Surgery to remove both ovaries.  The ovaries stop functioning for no known reason.  Tumors of the pituitary gland in the brain.  Medical disease that affects the ovaries and hormone production.  Radiation treatment to the abdomen or pelvis.  Chemotherapy that affects the ovaries.  What are the signs or symptoms?  Hot flashes.  Night sweats.  Decrease in sex drive.  Vaginal dryness and thinning of the vagina causing painful intercourse.  Dryness of the skin and developing wrinkles.  Headaches.  Tiredness.  Irritability.  Memory problems.  Weight gain.  Bladder infections.  Hair growth of the face and chest.  Infertility. More serious symptoms include:  Loss of bone (osteoporosis) causing breaks (fractures).  Depression.  Hardening and narrowing of the arteries (atherosclerosis) causing heart attacks and strokes.  How is this diagnosed?  When the menstrual periods have stopped for 12 straight months.  Physical exam.  Hormone studies of the blood. How is this treated? There are many treatment  choices and nearly as many questions about them. The decisions to treat or not to treat menopausal changes is an individual choice made with your health care provider. Your health care provider can discuss the treatments with you. Together, you can decide which treatment will work best for you. Your treatment choices may include:  Hormone therapy (estrogen and progesterone).  Non-hormonal medicines.  Treating the individual symptoms with medicine (for example antidepressants for depression).  Herbal medicines that may help specific symptoms.  Counseling by a psychiatrist or psychologist.  Group therapy.  Lifestyle changes including: ? Eating healthy. ? Regular exercise. ? Limiting caffeine and alcohol. ? Stress management and meditation.  No treatment.  Follow these instructions at home:  Take the medicine your health care provider gives you as directed.  Get plenty of sleep and rest.  Exercise regularly.  Eat a diet that contains calcium (good for the bones) and soy products (acts like estrogen hormone).  Avoid alcoholic beverages.  Do not smoke.  If you have hot flashes, dress in layers.  Take supplements, calcium, and vitamin D to strengthen bones.  You can use over-the-counter lubricants or moisturizers for vaginal dryness.  Group therapy is sometimes very helpful.  Acupuncture may be helpful in some cases. Contact a health care provider if:  You are not sure you are in menopause.  You are having menopausal symptoms and need advice and treatment.  You are still having menstrual periods after age 55 years.  You have pain with intercourse.  Menopause is complete (no menstrual period for 12 months) and you develop vaginal bleeding.  You need a referral to a specialist (gynecologist, psychiatrist, or psychologist) for treatment. Get help right   away if:  You have severe depression.  You have excessive vaginal bleeding.  You fell and think you have a  broken bone.  You have pain when you urinate.  You develop leg or chest pain.  You have a fast pounding heart beat (palpitations).  You have severe headaches.  You develop vision problems.  You feel a lump in your breast.  You have abdominal pain or severe indigestion. This information is not intended to replace advice given to you by your health care provider. Make sure you discuss any questions you have with your health care provider. Document Released: 05/05/2003 Document Revised: 07/21/2015 Document Reviewed: 09/11/2012 Elsevier Interactive Patient Education  2017 Elsevier Inc.  

## 2016-07-17 NOTE — Telephone Encounter (Signed)
Rx written today while patient was in office

## 2016-07-17 NOTE — Progress Notes (Signed)
   Subjective:    Patient ID: Madison Gentry, female    DOB: [birth year], 51 y.o.   MRN: [id number]  HPI Patient comes into the office complaining of amenorrhea and emotional lability.  Patient states she has been having hot flashes, mood swings, depression.  Patient thought she was going to start her menstrual cycle, but it was only spotting and her normal cycle is heavy bleeding for 7 days.  Patient states last cycle was normal about 6 weeks ago.  Patient even bought and took a pregnancy test which was negative.   Review of Systems  Constitutional: Positive for fatigue (lasting the last few months). Negative for activity change and appetite change.  Respiratory: Negative for cough and shortness of breath.   Cardiovascular: Negative for chest pain and palpitations.  Gastrointestinal: Negative for abdominal distention and abdominal pain.  Genitourinary: Positive for dysuria (not with every instance of intercourse) and menstrual problem (cycle abnormal). Negative for pelvic pain, vaginal discharge and vaginal pain.  Neurological: Positive for headaches (every day for years). Negative for dizziness.  All other systems reviewed and are negative.      Objective:   Physical Exam  Constitutional: She is oriented to person, place, and time. She appears well-developed and well-nourished. No distress.  HENT:  Head: Normocephalic.  Eyes: Pupils are equal, round, and reactive to light.  Neck: Neck supple. No JVD present. No thyromegaly present.  Cardiovascular: Normal rate, regular rhythm and normal heart sounds.   No murmur heard. Pulmonary/Chest: Effort normal and breath sounds normal. No respiratory distress.  Abdominal: Soft. Bowel sounds are normal. She exhibits no distension. There is no tenderness.  Musculoskeletal: Normal range of motion. She exhibits no edema.  Lymphadenopathy:    She has no cervical adenopathy.  Neurological: She is alert and oriented to person, place, and time.  Skin:  Skin is warm and dry.  Psychiatric: She has a normal mood and affect. Her behavior is normal. Judgment and thought content normal.    BP 137/82   Pulse (!) 101   Temp 97.3 F (36.3 C) (Oral)   Ht 5\' 2"  (1.575 m)   Wt 211 lb (95.7 kg)   BMI 38.59 kg/m      Assessment & Plan:   1. Emotional lability   2. Irregular menses    Discussed either waiting and see what happens over  The next month or going on birth control- she chose to wait and see what happens Recommended black kohash OTC for hot flashes and emotions- continue prozac  As described  Mary-Margaret Daphine DeutscherMartin, FNP

## 2016-07-24 ENCOUNTER — Telehealth: Payer: Self-pay | Admitting: Nurse Practitioner

## 2016-07-24 NOTE — Telephone Encounter (Signed)
What symptoms do you have? Swollen face from bad tooth  How long have you been sick? For a while  Have you been seen for this problem? no  If your provider decides to give you a prescription, which pharmacy would you like for it to be sent [facility] [place]   Patient informed that this information will be sent to the clinical staff for review and that they should receive a follow up call.

## 2016-07-24 NOTE — Telephone Encounter (Signed)
Pt aware - appt  

## 2016-07-24 NOTE — Telephone Encounter (Signed)
NTBS.

## 2016-07-24 NOTE — Telephone Encounter (Signed)
No drainage Very swollen  She does not have a dentist - looking for one She wants a antibiotic - there is some decay - [facility] pt  [facility]   She is also having some back pain - flare up -- wants a prednisone pack called in as well.

## 2016-07-25 ENCOUNTER — Ambulatory Visit (INDEPENDENT_AMBULATORY_CARE_PROVIDER_SITE_OTHER): Payer: Medicaid Other | Admitting: Pediatrics

## 2016-07-25 VITALS — BP 130/76 | HR 91 | Temp 97.2°F | Ht 62.0 in | Wt 211.0 lb

## 2016-07-25 DIAGNOSIS — B379 Candidiasis, unspecified: Secondary | ICD-10-CM

## 2016-07-25 DIAGNOSIS — K047 Periapical abscess without sinus: Secondary | ICD-10-CM | POA: Diagnosis not present

## 2016-07-25 MED ORDER — AMOXICILLIN-POT CLAVULANATE 875-125 MG PO TABS: 1.0000 | ORAL_TABLET | Freq: Two times a day (BID) | ORAL | 0 refills | Status: DC

## 2016-07-25 MED ORDER — FLUCONAZOLE 150 MG PO TABS: 150.0000 mg | ORAL_TABLET | ORAL | 0 refills | Status: DC | PRN

## 2016-07-25 NOTE — Progress Notes (Signed)
  Subjective:   Patient ID: Madison Gentry, female    DOB: [birth year], 51 y.o.   MRN: [id number] CC: Facial Swelling and tooth pain  HPI: Madison Gentry is a 51 y.o. female presenting for Facial Swelling, tooth pain  A few days ago R upper molar with swelling tenderness Needs to have tooth removed Has been seen by dentist in the past Sinus hurting some No fevers, normal appetite, otherwise feeling well Hurts to chew No nasal discharge, URI symptoms  Relevant past medical, surgical, family and social history reviewed. Allergies and medications reviewed and updated. History  Smoking Status  . Current Every Day Smoker  . Packs/day: 1.50  . Years: 28.00  . Types: Cigarettes  Smokeless Tobacco  . Never Used   ROS: Per HPI   Objective:    BP 130/76   Pulse 91   Temp 97.2 F (36.2 C) (Oral)   Ht 5\' 2"  (1.575 m)   Wt 211 lb (95.7 kg)   BMI 38.59 kg/m   Wt Readings from Last 3 Encounters:  07/25/16 211 lb (95.7 kg)  07/17/16 211 lb (95.7 kg)  06/07/16 212 lb (96.2 kg)    Gen: NAD, alert, cooperative with exam, NCAT EYES: EOMI, no conjunctival injection, or no icterus ENT:  TMs pearly gray b/l, OP without erythema, ttp R upper back molar buccal side of gum, no fluctuance LYMPH: no cervical LAD CV: NRRR, normal S1/S2, no murmur Resp: CTABL, no wheezes, normal WOB Abd: +BS, soft, NTND.  Ext: No edema, warm Neuro: Alert and oriented  Assessment & Plan:  Noreene LarssonJill was seen today for facial swelling and back pain.  Diagnoses and all orders for this visit:  Tooth abscess Treat with below Call dentist to set up appt -     amoxicillin-clavulanate (AUGMENTIN) 875-125 MG tablet; Take 1 tablet by mouth 2 (two) times daily.  Yeast infection Start below if needed -     fluconazole (DIFLUCAN) 150 MG tablet; Take 1 tablet (150 mg total) by mouth every three (3) days as needed.   Follow up plan: Return in about 2 weeks (around 08/08/2016) for for DM2 follow up. Rex Krasarol Veronnica Hennings, MD [facility]

## 2016-07-25 NOTE — Patient Instructions (Signed)
  [facility] [place]  [facility] [address] [place] [address] Phone: [phone]

## 2016-07-28 ENCOUNTER — Other Ambulatory Visit: Payer: Self-pay | Admitting: Nurse Practitioner

## 2016-07-28 ENCOUNTER — Other Ambulatory Visit: Payer: Self-pay | Admitting: Family Medicine

## 2016-07-30 NOTE — Telephone Encounter (Signed)
What is the name of the medication? Metformin and simvastin  Have you contacted your pharmacy to request a refill? yes  Which [facility] you [facility] [place] [place]   Patient notified that their request is being sent to the clinical staff for review and that they should receive a call once it is complete. If they do not receive a call within 24 hours they can check with their pharmacy or our office.

## 2016-08-13 ENCOUNTER — Other Ambulatory Visit: Payer: Self-pay | Admitting: Family Medicine

## 2016-08-14 ENCOUNTER — Telehealth: Payer: Self-pay | Admitting: Nurse Practitioner

## 2016-08-14 MED ORDER — IVERMECTIN 0.5 % EX LOTN: TOPICAL_LOTION | CUTANEOUS | 0 refills | Status: DC

## 2016-08-14 NOTE — Telephone Encounter (Signed)
[facility]

## 2016-08-14 NOTE — Telephone Encounter (Signed)
[facility], covering for PCP.   Treating for Head lice.   Murtis SinkSam Natasha Burda, MD [facility] 08/14/2016, 5:24 PM

## 2016-08-14 NOTE — Telephone Encounter (Signed)
Please advise 

## 2016-08-16 ENCOUNTER — Other Ambulatory Visit: Payer: Self-pay | Admitting: Family Medicine

## 2016-08-17 NOTE — Telephone Encounter (Signed)
Refill called to [facility] 

## 2016-08-17 NOTE — Telephone Encounter (Signed)
Last filled 06/15/16. Route to pool for phone in

## 2016-08-31 ENCOUNTER — Other Ambulatory Visit: Payer: Self-pay | Admitting: Nurse Practitioner

## 2016-09-12 ENCOUNTER — Other Ambulatory Visit: Payer: Self-pay | Admitting: Pediatrics

## 2016-09-13 ENCOUNTER — Telehealth: Payer: Self-pay | Admitting: Nurse Practitioner

## 2016-09-13 NOTE — Telephone Encounter (Signed)
What is the name of the medication? Ambien  Have you contacted your pharmacy to request a refill? YES  Which pharmacy would you like this sent to? [facility] in [place]   Patient notified that their request is being sent to the clinical staff for review and that they should receive a call once it is complete. If they do not receive a call within 24 hours they can check with their pharmacy or our office.

## 2016-09-13 NOTE — Telephone Encounter (Signed)
Last seen 07/25/16  Dr Oswaldo DoneVincent  MMM PCP  If approved [facility] nurse [facility]

## 2016-09-14 ENCOUNTER — Other Ambulatory Visit: Payer: Self-pay | Admitting: Pediatrics

## 2016-09-14 NOTE — Telephone Encounter (Signed)
Rx called into the pharmacy. 

## 2016-09-14 NOTE — Telephone Encounter (Signed)
Please call in ambien with 1 refills 

## 2016-09-17 NOTE — Telephone Encounter (Signed)
Last refill without being seen Please call in Burchardambien with 0 refills

## 2016-09-17 NOTE — Telephone Encounter (Signed)
[facility] 

## 2016-09-17 NOTE — Telephone Encounter (Signed)
Last seen 07/25/16  Dr Oswaldo DoneVincent  If approved route to nurse [facility]

## 2016-09-17 NOTE — Telephone Encounter (Signed)
Forwarding to PCP/MMM 

## 2016-09-27 ENCOUNTER — Other Ambulatory Visit: Payer: Self-pay | Admitting: Nurse Practitioner

## 2016-09-27 DIAGNOSIS — F329 Major depressive disorder, single episode, unspecified: Secondary | ICD-10-CM

## 2016-09-27 DIAGNOSIS — F32A Depression, unspecified: Secondary | ICD-10-CM

## 2016-10-01 ENCOUNTER — Other Ambulatory Visit: Payer: Self-pay | Admitting: Family Medicine

## 2016-10-01 ENCOUNTER — Other Ambulatory Visit: Payer: Self-pay | Admitting: Nurse Practitioner

## 2016-10-01 DIAGNOSIS — F411 Generalized anxiety disorder: Secondary | ICD-10-CM

## 2016-10-12 ENCOUNTER — Other Ambulatory Visit: Payer: Self-pay | Admitting: Family Medicine

## 2016-10-12 DIAGNOSIS — I10 Essential (primary) hypertension: Secondary | ICD-10-CM

## 2016-10-30 ENCOUNTER — Other Ambulatory Visit: Payer: Self-pay | Admitting: Pediatrics

## 2016-10-30 ENCOUNTER — Other Ambulatory Visit: Payer: Self-pay | Admitting: Nurse Practitioner

## 2016-10-30 DIAGNOSIS — E785 Hyperlipidemia, unspecified: Secondary | ICD-10-CM

## 2016-10-30 DIAGNOSIS — F411 Generalized anxiety disorder: Secondary | ICD-10-CM

## 2016-11-15 ENCOUNTER — Ambulatory Visit: Payer: Self-pay | Admitting: Pediatrics

## 2016-11-30 ENCOUNTER — Other Ambulatory Visit: Payer: Self-pay | Admitting: Nurse Practitioner

## 2016-11-30 DIAGNOSIS — E785 Hyperlipidemia, unspecified: Secondary | ICD-10-CM

## 2016-12-05 ENCOUNTER — Other Ambulatory Visit: Payer: Self-pay | Admitting: Nurse Practitioner

## 2016-12-05 DIAGNOSIS — F411 Generalized anxiety disorder: Secondary | ICD-10-CM

## 2016-12-06 NOTE — Telephone Encounter (Signed)
Rx called to pharmacy

## 2016-12-06 NOTE — Telephone Encounter (Signed)
Please call in zolipdem with 1 refills

## 2016-12-11 ENCOUNTER — Other Ambulatory Visit: Payer: Self-pay | Admitting: Nurse Practitioner

## 2016-12-12 ENCOUNTER — Ambulatory Visit: Payer: Self-pay | Admitting: Family Medicine

## 2016-12-12 ENCOUNTER — Ambulatory Visit: Payer: Self-pay | Admitting: Nurse Practitioner

## 2016-12-12 NOTE — Progress Notes (Deleted)
   Subjective: OZ:HYQMCC:hand injury  PCP: Bennie PieriniMartin, Mary-Margaret, FNP VHQ:IONGHPI:Laquiesha Katrinka BlazingSmith is a 51 y.o. female presenting to clinic today for:  1. Hand injury ***  No Known Allergies Past Medical History:  Diagnosis Date  . CAD (coronary artery disease)   . COPD (chronic obstructive pulmonary disease) (HCC)   . Diabetes (HCC)   . Hypertension   . Stroke [facility](HCC)    Family History  Problem Relation Age of Onset  . CAD Father 3945       Died age 49  . CAD Brother 1740  . CAD Mother 5750   Social Hx: *** smoker.Current medications reviewed.    Health Maintenance: *** Flu Vaccine needed: {YES/NO/WILD CARDS:18581}  Tdap Vaccine needed: {YES/NO/WILD CARDS:18581}  - every 6461yrs - (<3 lifetime doses or unknown): all wounds -- look up need for Tetanus IG - (>=3 lifetime doses): clean/minor wound if >6761yrs from previous; all other wounds if >3974yrs from previous Zoster Vaccine needed: {YES/NO/WILD CARDS:18581} (those >50yo, once) Pneumonia Vaccine needed: {YES/NO/WILD CARDS:18581} (those w/ risk factors) - (<5434yr) Both: Immunocompromised, cochlear implant, CSF leak, asplenic, sickle cell, Chronic Renal Failure - (<4734yr) PPSV-23 only: Heart dz, lung disease, DM, tobacco abuse, alcoholism, cirrhosis/liver disease. - (>234yr): PPSV13 then PPSV23 in 6-12mths;  - (>6934yr): repeat PPSV23 once if pt received prior to 49yo and 6474yrs have passed   ROS: Per HPI  Objective: Office vital signs reviewed. There were no vitals taken for this visit.  Physical Examination:  General: Awake, alert, *** nourished, No acute distress HEENT: Normal    Neck: No masses palpated. No lymphadenopathy    Ears: Tympanic membranes intact, normal light reflex, no erythema, no bulging    Eyes: PERRLA, extraocular membranes intact, sclera ***    Nose: nasal turbinates moist, *** nasal discharge    Throat: moist mucus membranes, no erythema, *** tonsillar exudate.  Airway is patent Cardio: regular rate and rhythm, S1S2 heard, no  murmurs appreciated Pulm: clear to auscultation bilaterally, no wheezes, rhonchi or rales; normal work of breathing on room air GI: soft, non-tender, non-distended, bowel sounds present x4, no hepatomegaly, no splenomegaly, no masses GU: external vaginal tissue ***, cervix ***, *** punctate lesions on cervix appreciated, *** discharge from cervical os, *** bleeding, *** cervical motion tenderness, *** abdominal/ adnexal masses Extremities: warm, well perfused, No edema, cyanosis or clubbing; +*** pulses bilaterally MSK: *** gait and *** station Skin: dry; intact; no rashes or lesions Neuro: *** Strength and light touch sensation grossly intact, *** DTRs ***/4  Assessment/ Plan: 51 y.o. female   ***  No orders of the defined types were placed in this encounter.  No orders of the defined types were placed in this encounter.    Raliegh IpAshly M Vinita Prentiss, DO [facility] [phone]

## 2016-12-12 NOTE — Telephone Encounter (Signed)
LMOVM NTBS. Talked with patient earlier today but was unable to make appt at time she was driving

## 2016-12-17 ENCOUNTER — Other Ambulatory Visit: Payer: Self-pay | Admitting: Nurse Practitioner

## 2016-12-17 DIAGNOSIS — F329 Major depressive disorder, single episode, unspecified: Secondary | ICD-10-CM

## 2016-12-17 DIAGNOSIS — F32A Depression, unspecified: Secondary | ICD-10-CM

## 2016-12-17 DIAGNOSIS — I10 Essential (primary) hypertension: Secondary | ICD-10-CM

## 2016-12-18 ENCOUNTER — Telehealth: Payer: Self-pay | Admitting: Nurse Practitioner

## 2016-12-18 NOTE — Telephone Encounter (Signed)
LMOVM to call back to make appt within next 3 wks

## 2016-12-18 NOTE — Telephone Encounter (Signed)
Last seen 07/25/16  Dr Oswaldo DoneVincent  MMM PCP

## 2016-12-20 ENCOUNTER — Telehealth: Payer: Self-pay | Admitting: Nurse Practitioner

## 2016-12-20 NOTE — Telephone Encounter (Signed)
Patient out of all meds called to get in with PCP today and cant get in with mmm until 11/16. Please advise.

## 2016-12-21 NOTE — Telephone Encounter (Signed)
Patient out of regular meds. Looks like they were filled for 1 month on 10/22. Patient notified that meds were sent and to make an appt within a month. Patient verbalized understanding

## 2016-12-21 NOTE — Telephone Encounter (Signed)
Sorry but has  To be seen if talking about pain meds

## 2016-12-31 ENCOUNTER — Other Ambulatory Visit: Payer: Self-pay | Admitting: Nurse Practitioner

## 2016-12-31 ENCOUNTER — Other Ambulatory Visit: Payer: Self-pay | Admitting: Pediatrics

## 2016-12-31 DIAGNOSIS — F411 Generalized anxiety disorder: Secondary | ICD-10-CM

## 2017-01-01 ENCOUNTER — Other Ambulatory Visit: Payer: Self-pay | Admitting: Nurse Practitioner

## 2017-01-01 DIAGNOSIS — F329 Major depressive disorder, single episode, unspecified: Secondary | ICD-10-CM

## 2017-01-01 DIAGNOSIS — E785 Hyperlipidemia, unspecified: Secondary | ICD-10-CM

## 2017-01-01 DIAGNOSIS — F32A Depression, unspecified: Secondary | ICD-10-CM

## 2017-01-01 DIAGNOSIS — F411 Generalized anxiety disorder: Secondary | ICD-10-CM

## 2017-01-01 DIAGNOSIS — I10 Essential (primary) hypertension: Secondary | ICD-10-CM

## 2017-01-01 MED ORDER — ZETIA 10 MG PO TABS: ORAL_TABLET | ORAL | 0 refills | Status: DC

## 2017-01-01 MED ORDER — SIMVASTATIN 40 MG PO TABS: ORAL_TABLET | ORAL | 0 refills | Status: DC

## 2017-01-01 MED ORDER — METFORMIN HCL 500 MG PO TABS: ORAL_TABLET | ORAL | 0 refills | Status: DC

## 2017-01-01 MED ORDER — FLUOXETINE HCL 20 MG PO CAPS: 20.0000 mg | ORAL_CAPSULE | Freq: Two times a day (BID) | ORAL | 0 refills | Status: DC

## 2017-01-01 MED ORDER — AMLODIPINE BESYLATE 10 MG PO TABS: 10.0000 mg | ORAL_TABLET | Freq: Every day | ORAL | 0 refills | Status: DC

## 2017-01-01 NOTE — Telephone Encounter (Signed)
Ost meds were filled but no control meds were filled will have to wait until appointment for thoise

## 2017-01-02 ENCOUNTER — Other Ambulatory Visit: Payer: Self-pay | Admitting: Pediatrics

## 2017-01-02 NOTE — Telephone Encounter (Signed)
Last seen 07/25/16  MMM

## 2017-01-03 ENCOUNTER — Ambulatory Visit: Payer: Medicaid Other | Admitting: Nurse Practitioner

## 2017-01-07 ENCOUNTER — Encounter: Payer: Self-pay | Admitting: Nurse Practitioner

## 2017-01-11 ENCOUNTER — Other Ambulatory Visit: Payer: Self-pay | Admitting: Nurse Practitioner

## 2017-01-11 DIAGNOSIS — F411 Generalized anxiety disorder: Secondary | ICD-10-CM

## 2017-01-15 NOTE — Telephone Encounter (Signed)
Multiple attempts.  Patient needs and appt to be seen.  This encounter will now be closed

## 2017-01-25 ENCOUNTER — Other Ambulatory Visit: Payer: Self-pay | Admitting: Nurse Practitioner

## 2017-01-25 DIAGNOSIS — F411 Generalized anxiety disorder: Secondary | ICD-10-CM

## 2017-01-25 NOTE — Telephone Encounter (Signed)
Seen 07/25/16  Dr Oswaldo DoneVincent  MMM PCP

## 2017-01-25 NOTE — Telephone Encounter (Signed)
Was refilled last month with needs to be seen note on Rx by PCP. Needs to be seen.

## 2017-01-28 NOTE — Telephone Encounter (Signed)
Patient aware and appt made 

## 2017-02-01 ENCOUNTER — Encounter: Payer: Self-pay | Admitting: Nurse Practitioner

## 2017-02-01 ENCOUNTER — Ambulatory Visit (INDEPENDENT_AMBULATORY_CARE_PROVIDER_SITE_OTHER): Payer: Medicaid Other | Admitting: Nurse Practitioner

## 2017-02-01 VITALS — BP 133/83 | HR 91 | Temp 97.6°F | Ht 62.0 in | Wt 215.0 lb

## 2017-02-01 DIAGNOSIS — E66812 Obesity, class 2: Secondary | ICD-10-CM

## 2017-02-01 DIAGNOSIS — F329 Major depressive disorder, single episode, unspecified: Secondary | ICD-10-CM | POA: Diagnosis not present

## 2017-02-01 DIAGNOSIS — G8929 Other chronic pain: Secondary | ICD-10-CM

## 2017-02-01 DIAGNOSIS — E119 Type 2 diabetes mellitus without complications: Secondary | ICD-10-CM

## 2017-02-01 DIAGNOSIS — F1110 Opioid abuse, uncomplicated: Secondary | ICD-10-CM

## 2017-02-01 DIAGNOSIS — Z720 Tobacco use: Secondary | ICD-10-CM | POA: Diagnosis not present

## 2017-02-01 DIAGNOSIS — F3341 Major depressive disorder, recurrent, in partial remission: Secondary | ICD-10-CM | POA: Diagnosis not present

## 2017-02-01 DIAGNOSIS — F411 Generalized anxiety disorder: Secondary | ICD-10-CM

## 2017-02-01 DIAGNOSIS — E785 Hyperlipidemia, unspecified: Secondary | ICD-10-CM | POA: Diagnosis not present

## 2017-02-01 DIAGNOSIS — M545 Other chronic pain: Secondary | ICD-10-CM

## 2017-02-01 DIAGNOSIS — F5101 Primary insomnia: Secondary | ICD-10-CM | POA: Insufficient documentation

## 2017-02-01 DIAGNOSIS — I10 Essential (primary) hypertension: Secondary | ICD-10-CM | POA: Diagnosis not present

## 2017-02-01 DIAGNOSIS — I25119 Atherosclerotic heart disease of native coronary artery with unspecified angina pectoris: Secondary | ICD-10-CM

## 2017-02-01 DIAGNOSIS — E559 Vitamin D deficiency, unspecified: Secondary | ICD-10-CM | POA: Diagnosis not present

## 2017-02-01 DIAGNOSIS — F32A Depression, unspecified: Secondary | ICD-10-CM

## 2017-02-01 DIAGNOSIS — K219 Gastro-esophageal reflux disease without esophagitis: Secondary | ICD-10-CM

## 2017-02-01 DIAGNOSIS — Z6838 Body mass index (BMI) 38.0-38.9, adult: Secondary | ICD-10-CM

## 2017-02-01 DIAGNOSIS — E1142 Type 2 diabetes mellitus with diabetic polyneuropathy: Secondary | ICD-10-CM

## 2017-02-01 LAB — BAYER DCA HB A1C WAIVED: HB A1C (BAYER DCA - WAIVED): 6.9 % (ref <7.0)

## 2017-02-01 MED ORDER — ZETIA 10 MG PO TABS: ORAL_TABLET | ORAL | 1 refills | Status: DC

## 2017-02-01 MED ORDER — AMLODIPINE BESYLATE 10 MG PO TABS: 10.0000 mg | ORAL_TABLET | Freq: Every day | ORAL | 1 refills | Status: DC

## 2017-02-01 MED ORDER — FLUOXETINE HCL 20 MG PO CAPS: 20.0000 mg | ORAL_CAPSULE | Freq: Two times a day (BID) | ORAL | 1 refills | Status: DC

## 2017-02-01 MED ORDER — METFORMIN HCL 500 MG PO TABS: ORAL_TABLET | ORAL | 1 refills | Status: DC

## 2017-02-01 MED ORDER — NITROGLYCERIN 0.4 MG SL SUBL: SUBLINGUAL_TABLET | SUBLINGUAL | 0 refills | Status: DC

## 2017-02-01 MED ORDER — NEXIUM 40 MG PO CPDR: 40.0000 mg | DELAYED_RELEASE_CAPSULE | Freq: Every day | ORAL | 1 refills | Status: DC

## 2017-02-01 MED ORDER — HYDROCHLOROTHIAZIDE 25 MG PO TABS
25.0000 mg | ORAL_TABLET | Freq: Every day | ORAL | 1 refills | Status: DC
Start: 2017-02-01 — End: 2017-10-11

## 2017-02-01 MED ORDER — BUSPIRONE HCL 7.5 MG PO TABS: ORAL_TABLET | ORAL | 5 refills | Status: DC

## 2017-02-01 MED ORDER — ZOLPIDEM TARTRATE 10 MG PO TABS: 10.0000 mg | ORAL_TABLET | Freq: Every day | ORAL | 2 refills | Status: DC

## 2017-02-01 NOTE — Patient Instructions (Signed)
Secondhand Smoke What is secondhand smoke? Secondhand smoke is smoke that comes from burning tobacco. It could be the smoke from a cigarette, a pipe, or a cigar. Even if you are not the one smoking, secondhand smoke exposes you to the dangers of smoking. This is called involuntary, or passive, smoking. There are two types of secondhand smoke:  Sidestream smoke is the smoke that comes off the lighted end of a cigarette, pipe, or cigar. ? This type of smoke has the highest amount of cancer-causing agents (carcinogens). ? The particles in sidestream smoke are smaller. They get into your lungs more easily.  Mainstream smoke is the smoke that is exhaled by a person who is smoking. ? This type of smoke is also dangerous to your health.  How can secondhand smoke affect my health? Studies show that there is no safe level of secondhand smoke. This smoke contains thousands of chemicals. At least 69 of them are known to cause cancer. Secondhand smoke can also cause many other health problems. It has been linked to:  Lung cancer.  Cancer of the voice box (larynx) or throat.  Cancer of the sinuses.  Brain cancer.  Bladder cancer.  Stomach cancer.  Breast cancer.  White blood cell cancers (lymphoma and leukemia).  Brain and liver tumors in children.  Heart disease and stroke in adults.  Pregnancy loss (miscarriage).  Diseases in children, such as: ? Asthma. ? Lung infections. ? Ear infections. ? Sudden infant death syndrome (SIDS). ? Slow growth.  Where can I be at risk for exposure to secondhand smoke?  For adults, the workplace is the main source of exposure to secondhand smoke. ? Your workplace should have a policy separating smoking areas from nonsmoking areas. ? Smoking areas should have a system for ventilating and cleaning the air.  For children, the home may be the most dangerous place for exposure to secondhand smoke. ? Children who live in apartment buildings may be at  risk from smoke drifting from hallways or other people's homes.  For everyone, many public places are possible sources of exposure to secondhand smoke. ? These places include restaurants, shopping centers, and parks. How can I reduce my risk for exposure to secondhand smoke? The most important thing you can do is not smoke. Discourage family members from smoking. Other ways to reduce exposure for you and your family include the following:  Keep your home smoke free.  Make sure your child care providers do not smoke.  Warn your child about the dangers of smoking and secondhand smoke.  Do not allow smoking in your car. When someone smokes in a car, all the damaging chemicals from the smoke are confined in a small area.  Avoid public places where smoking is allowed.  This information is not intended to replace advice given to you by your health care provider. Make sure you discuss any questions you have with your health care provider. Document Released: 03/22/2004 Document Revised: 01/10/2016 Document Reviewed: 05/29/2013 Elsevier Interactive Patient Education  2017 Elsevier Inc.  

## 2017-02-01 NOTE — Progress Notes (Signed)
Subjective:    Patient ID: Madison Gentry, female    DOB: Oct 19, 1965, 51 y.o.   MRN: [id number]  HPI  Madison Gentry is here today for follow up of chronic medical problem.  Outpatient Encounter Medications as of 02/01/2017  Medication Sig  . amLODipine (NORVASC) 10 MG tablet Take 1 tablet (10 mg total) daily by mouth.  . Aspirin-Acetaminophen-Caffeine (GOODYS EXTRA STRENGTH) 520-260-32.5 MG PACK Take 1 packet by mouth 2 (two) times daily as needed. For pain or headache   . Blood Glucose Monitoring Suppl MISC Check BS twice daily and prn  . busPIRone (BUSPAR) 7.5 MG tablet TAKE 1 TABLET BY MOUTH ONCE DAILY. NEEDS TO BE SEEN FOR REFILL  . cyclobenzaprine (FLEXERIL) 10 MG tablet Take 1 tablet (10 mg total) by mouth 3 (three) times daily.  . diclofenac (VOLTAREN) 75 MG EC tablet Take 1 tablet (75 mg total) by mouth 2 (two) times daily.  Marland Kitchen FLUoxetine (PROZAC) 20 MG capsule Take 1 capsule (20 mg total) 2 (two) times daily by mouth.  Marland Kitchen glucose blood test strip Use as instructed  . hydrochlorothiazide (HYDRODIURIL) 25 MG tablet TAKE 1 TABLET BY MOUTH ONCE DAILY  . ibuprofen (ADVIL,MOTRIN) 200 MG tablet Take 200 mg by mouth every 6 (six) hours as needed. Takes 800 mg when needed for menstrual cramping  . Ivermectin 0.5 % LOTN Apply to dry scalp,  Leave on for 10 minutes, rinse thoroughly with warm water.  . Lancets (ONETOUCH ULTRASOFT) lancets Use as instructed  . metFORMIN (GLUCOPHAGE) 500 MG tablet TAKE 1 TABLET BY MOUTH ONCE DAILY WITH BREAKFAST. NEEDS TO BE SEEN FOR REFILL  . NEXIUM 40 MG capsule Take 1 capsule (40 mg total) by mouth daily.  . nicotine (NICODERM CQ - DOSED IN MG/24 HOURS) 21 mg/24hr patch APPLY ONE PATCH TOPICALLY ONCE DAILY  . NITROSTAT 0.4 MG SL tablet DISSOLVE ONE TABLET UNDER THE TONGUE EVERY 5 MINUTES AS NEEDED FOR CHEST PAIN.  DO NOT EXCEED A TOTAL OF 3 DOSES IN 15 MINUTES  . simvastatin (ZOCOR) 40 MG tablet TAKE 1 TABLET BY MOUTH ONCE DAILY MUST  BE  SEEN  FOR  ADDITIONAL   REFILLS  . ZETIA 10 MG tablet TAKE 1 TABLET BY MOUTH ONCE DAILY MUST  BE  SEEN  FOR  ADDITIONAL  REFILLS  . zolpidem (AMBIEN) 10 MG tablet TAKE 1 TABLET BY MOUTH ONCE DAILY AT BEDTIME    1. Essential hypertension  No c/o chest pain, sob or headache. Does not check blood pressure at home  2. Diabetes mellitus without complication (HCC)  Last HGBA1C was 7.5%. She does not check blood sugars at home at all.   3. Hyperlipidemia, unspecified hyperlipidemia type  Not watching diet  4. Type 2 diabetes mellitus with diabetic polyneuropathy, without long-term current use of insulin (HCC) occasional burning and tingling n feet and hands- hands worse the feet  5. Class 2 severe obesity due to excess calories with serious comorbidity and body mass index (BMI) of 38.0 to 38.9 in adult [facility]) no recent weight change  6. Atherosclerosis of native coronary artery of native heart with angina pectoris ([place])  She had chest pain about 8 months ago and took 1 nitro and it went away. Has not sen cardiology in awhile  7. Recurrent major depressive disorder, in partial remission (HCC)  Takes prozac bid- helps- no side effects Depression screen [facility] 2/9 02/01/2017 07/25/2016 07/17/2016  Decreased Interest 0 0 0  Down, Depressed, Hopeless 0 1 0  PHQ -  2 Score 0 1 0  Altered sleeping - - -  Tired, decreased energy - - -  Change in appetite - - -  Feeling bad or failure about yourself  - - -  Trouble concentrating - - -  Moving slowly or fidgety/restless - - -  Suicidal thoughts - - -  PHQ-9 Score - - -     8. Tobacco user   no desire to stop at this time  9. Vitamin D deficiency  Currently not on anything  10. Narcotic abuse ([place])  Goes to pain clinic in [place]   11. Chronic midline low back pain without sciatica  No better- goes to pain clinic    New complaints: None today  Social history: Lives with husband and 2 children and granddaughter. Does not work    Review of Systems  Constitutional:  Negative for activity change and appetite change.  HENT: Negative.   Eyes: Negative for pain.  Respiratory: Negative for shortness of breath.   Cardiovascular: Negative for chest pain, palpitations and leg swelling.  Gastrointestinal: Negative for abdominal pain.  Endocrine: Negative for polydipsia.  Genitourinary: Negative.   Skin: Negative for rash.  Neurological: Negative for dizziness, weakness and headaches.  Hematological: Does not bruise/bleed easily.  Psychiatric/Behavioral: Negative.   All other systems reviewed and are negative.      Objective:   Physical Exam  Constitutional: She is oriented to person, place, and time. She appears well-developed and well-nourished.  HENT:  Nose: Nose normal.  Mouth/Throat: Oropharynx is clear and moist.  Eyes: EOM are normal.  Neck: Trachea normal, normal range of motion and full passive range of motion without pain. Neck supple. No JVD present. Carotid bruit is not present. No thyromegaly present.  Cardiovascular: Normal rate, regular rhythm, normal heart sounds and intact distal pulses. Exam reveals no gallop and no friction rub.  No murmur heard. Pulmonary/Chest: Effort normal and breath sounds normal.  Abdominal: Soft. Bowel sounds are normal. She exhibits no distension and no mass. There is no tenderness.  Musculoskeletal: Normal range of motion.  Lymphadenopathy:    She has no cervical adenopathy.  Neurological: She is alert and oriented to person, place, and time. She has normal reflexes.  Skin: Skin is warm and dry.  Psychiatric: She has a normal mood and affect. Her behavior is normal. Judgment and thought content normal.   BP 133/83   Pulse 91   Temp 97.6 F (36.4 C) (Oral)   Ht '5\' 2"'$  (1.575 m)   Wt 215 lb (97.5 kg)   BMI 39.32 kg/m   hgba1c 6.9%     Assessment & Plan:  1. Essential hypertension Low sodium diet - CMP14+EGFR - hydrochlorothiazide (HYDRODIURIL) 25 MG tablet; Take 1 tablet (25 mg total) by mouth  daily.  Dispense: 90 tablet; Refill: 1 - amLODipine (NORVASC) 10 MG tablet; Take 1 tablet (10 mg total) by mouth daily.  Dispense: 90 tablet; Refill: 1  2. Diabetes mellitus without complication ([place]) contnue to watch arbsin diet - Bayer DCA Hb A1c Waived - metFORMIN (GLUCOPHAGE) 500 MG tablet; TAKE 1 TABLET BY MOUTH ONCE DAILY WITH BREAKFAST. NEEDS TO BE SEEN FOR REFILL  Dispense: 90 tablet; Refill: 1  3. Hyperlipidemia, unspecified hyperlipidemia type Low fat diet - Lipid panel  4. Type 2 diabetes mellitus with diabetic polyneuropathy, without long-term current use of insulin (HCC) Do not go barefooted  5. Class 2 severe obesity due to excess calories with serious comorbidity and body mass index (BMI) of  38.0 to 38.9 in adult [facility]) Discussed diet and exercise for person with BMI >25 Will recheck weight in 3-6 months  6. Atherosclerosis of native coronary artery of native heart with angina pectoris ([place]) Follow up with cardiology  7. Recurrent major depressive disorder, in partial remission ([place]) stress management  8. Tobacco user Smoking cessation doscussed  9. Vitamin D deficiency vitain d otc daily encourgaed  10. Narcotic abuse ([place]) Again smoking cessation encouraged  11. Chronic midline low back pain without sciatica Continue follow up at pain clininc  12. Depression stres management - FLUoxetine (PROZAC) 20 MG capsule; Take 1 capsule (20 mg total) by mouth 2 (two) times daily.  Dispense: 180 capsule; Refill: 1  13. GAD (generalized anxiety disorder) - busPIRone (BUSPAR) 7.5 MG tablet; TAKE 1 TABLET BY MOUTH ONCE DAILY. NEEDS TO BE SEEN FOR REFILL  Dispense: 30 tablet; Refill: 5  14. HLD (hyperlipidemia) Low fat diet - ZETIA 10 MG tablet; TAKE 1 TABLET BY MOUTH ONCE DAILY MUST  BE  SEEN  FOR  ADDITIONAL  REFILLS  Dispense: 90 tablet; Refill: 1 - nitroGLYCERIN (NITROSTAT) 0.4 MG SL tablet; DISSOLVE ONE TABLET UNDER THE TONGUE EVERY 5 MINUTES AS NEEDED FOR CHEST  PAIN.  DO NOT EXCEED A TOTAL OF 3 DOSES IN 15 MINUTES  Dispense: 25 tablet; Refill: 0  15. Primary insomnia Bedtime routine - zolpidem (AMBIEN) 10 MG tablet; Take 1 tablet (10 mg total) by mouth at bedtime.  Dispense: 30 tablet; Refill: 2  16. Gastroesophageal reflux disease without esophagitis Avoid spicy foods Do not eat 2 hours prior to bedtime - NEXIUM 40 MG capsule; Take 1 capsule (40 mg total) by mouth daily.  Dispense: 90 capsule; Refill: 1    Labs pending Health maintenance reviewed Diet and exercise encouraged Continue all meds Follow up  In 3 months   [place], FNP

## 2017-02-02 LAB — CMP14+EGFR
ALK PHOS: 70 IU/L (ref 39–117)
ALT: 24 IU/L (ref 0–32)
AST: 19 IU/L (ref 0–40)
Albumin/Globulin Ratio: 1.9 (ref 1.2–2.2)
Albumin: 4.2 g/dL (ref 3.5–5.5)
BUN/Creatinine Ratio: 20 (ref 9–23)
BUN: 10 mg/dL (ref 6–24)
Bilirubin Total: <0.2 mg/dL (ref 0.0–1.2)
CALCIUM: 9.4 mg/dL (ref 8.7–10.2)
CO2: 27 mmol/L (ref 20–29)
CREATININE: 0.51 mg/dL — AB (ref 0.57–1.00)
Chloride: 98 mmol/L (ref 96–106)
GFR calc Af Amer: 131 mL/min/{1.73_m2} (ref >59)
GFR, EST NON AFRICAN AMERICAN: 113 mL/min/{1.73_m2} (ref >59)
GLUCOSE: 94 mg/dL (ref 65–99)
Globulin, Total: 2.2 g/dL (ref 1.5–4.5)
Potassium: 3.6 mmol/L (ref 3.5–5.2)
Sodium: 141 mmol/L (ref 134–144)
Total Protein: 6.4 g/dL (ref 6.0–8.5)

## 2017-02-02 LAB — LIPID PANEL
CHOL/HDL RATIO: 4 ratio (ref 0.0–4.4)
CHOLESTEROL TOTAL: 151 mg/dL (ref 100–199)
HDL: 38 mg/dL — AB (ref >39)
LDL CALC: 46 mg/dL (ref 0–99)
TRIGLYCERIDES: 335 mg/dL — AB (ref 0–149)
VLDL Cholesterol Cal: 67 mg/dL — ABNORMAL HIGH (ref 5–40)

## 2017-02-06 ENCOUNTER — Other Ambulatory Visit: Payer: Self-pay

## 2017-02-06 DIAGNOSIS — E785 Hyperlipidemia, unspecified: Secondary | ICD-10-CM

## 2017-02-06 MED ORDER — EZETIMIBE 10 MG PO TABS: ORAL_TABLET | ORAL | 1 refills | Status: DC

## 2017-02-08 ENCOUNTER — Encounter: Payer: Self-pay | Admitting: Physician Assistant

## 2017-02-08 ENCOUNTER — Ambulatory Visit: Payer: Medicaid Other | Admitting: Physician Assistant

## 2017-02-08 ENCOUNTER — Telehealth: Payer: Self-pay | Admitting: Nurse Practitioner

## 2017-02-08 ENCOUNTER — Ambulatory Visit: Payer: Medicaid Other

## 2017-02-08 VITALS — BP 139/81 | HR 98 | Temp 97.9°F | Ht 62.0 in | Wt 215.6 lb

## 2017-02-08 DIAGNOSIS — N3001 Acute cystitis with hematuria: Secondary | ICD-10-CM | POA: Diagnosis not present

## 2017-02-08 DIAGNOSIS — R300 Dysuria: Secondary | ICD-10-CM

## 2017-02-08 LAB — MICROSCOPIC EXAMINATION
RENAL EPITHEL UA: NONE SEEN /HPF
WBC, UA: >30 /hpf — AB (ref 0–?)

## 2017-02-08 LAB — URINALYSIS, COMPLETE
Bilirubin, UA: NEGATIVE
GLUCOSE, UA: NEGATIVE
Ketones, UA: NEGATIVE
NITRITE UA: NEGATIVE
PH UA: 6.5 (ref 5.0–7.5)
Protein, UA: NEGATIVE
Specific Gravity, UA: 1.015 (ref 1.005–1.030)
UUROB: 0.2 mg/dL (ref 0.2–1.0)

## 2017-02-08 MED ORDER — PHENAZOPYRIDINE HCL 100 MG PO TABS: 100.0000 mg | ORAL_TABLET | Freq: Three times a day (TID) | ORAL | 0 refills | Status: DC | PRN

## 2017-02-08 MED ORDER — SULFAMETHOXAZOLE-TRIMETHOPRIM 800-160 MG PO TABS: 1.0000 | ORAL_TABLET | Freq: Two times a day (BID) | ORAL | 0 refills | Status: DC

## 2017-02-08 NOTE — Telephone Encounter (Signed)
Pt c/o hematuria With clots  this morning with Dysuria. Pelvic pain sharp. Denies Fever. Has appt at 630 tonight the only thing we had. Please advise. Stated she didn't think she could wait.

## 2017-02-08 NOTE — Patient Instructions (Signed)
In a few days you may receive a survey in the mail or online from [employer] regarding your visit with us today. Please take a moment to fill this out. Your feedback is very important to our whole office. It can help us better understand your needs as well as improve your experience and satisfaction. Thank you for taking your time to complete it. We care about you.  Diyan Dave, PA-C  

## 2017-02-08 NOTE — Telephone Encounter (Signed)
appt scheduled Pt notified 

## 2017-02-10 LAB — URINE CULTURE

## 2017-02-10 NOTE — Progress Notes (Signed)
BP 139/81   Pulse 98   Temp 97.9 F (36.6 C) (Oral)   Ht 5\' 2"  (1.575 m)   Wt 215 lb 9.6 oz (97.8 kg)   BMI 39.43 kg/m    Subjective:    Patient ID: Madison Gentry Dougan, female    DOB: March 10, 1965, 51 y.o.   MRN: [id number]  HPI: Madison Gentry Cairns is a 51 y.o. female presenting on 02/08/2017 for Back Pain and Hematuria This patient has had several days of dysuria, frequency and nocturia. There is also pain over the bladder in the suprapubic region, no back pain. Denies leakage or BUT does have hematuria.  Denies fever or chills. No pain in flank area.  Relevant past medical, surgical, family and social history reviewed and updated as indicated. Allergies and medications reviewed and updated.  Past Medical History:  Diagnosis Date  . CAD (coronary artery disease)   . COPD (chronic obstructive pulmonary disease) (HCC)   . Diabetes (HCC)   . Hypertension   . Stroke [facility](HCC)     Past Surgical History:  Procedure Laterality Date  . TUBAL LIGATION      Review of Systems  Constitutional: Negative.   HENT: Negative.   Eyes: Negative.   Respiratory: Negative.   Gastrointestinal: Negative.   Genitourinary: Positive for difficulty urinating, dysuria, hematuria and urgency. Negative for flank pain.    Allergies as of 02/08/2017   No Known Allergies     Medication List        Accurate as of 02/08/17 11:59 PM. Always use your most recent med list.          amLODipine 10 MG tablet Commonly known as:  NORVASC Take 1 tablet (10 mg total) by mouth daily.   Blood Glucose Monitoring Suppl Misc Check BS twice daily and prn   busPIRone 7.5 MG tablet Commonly known as:  BUSPAR TAKE 1 TABLET BY MOUTH ONCE DAILY. NEEDS TO BE SEEN FOR REFILL   cyclobenzaprine 10 MG tablet Commonly known as:  FLEXERIL Take 1 tablet (10 mg total) by mouth 3 (three) times daily.   diclofenac 75 MG EC tablet Commonly known as:  VOLTAREN Take 1 tablet (75 mg total) by mouth 2 (two) times daily.   ezetimibe 10  MG tablet Commonly known as:  ZETIA TAKE 1 TABLET BY MOUTH ONCE DAILY MUST  BE  SEEN  FOR  ADDITIONAL  REFILLS   FLUoxetine 20 MG capsule Commonly known as:  PROZAC Take 1 capsule (20 mg total) by mouth 2 (two) times daily.   glucose blood test strip Use as instructed   GOODYS EXTRA STRENGTH 520-260-32.5 MG Pack Generic drug:  Aspirin-Acetaminophen-Caffeine Take 1 packet by mouth 2 (two) times daily as needed. For pain or headache   hydrochlorothiazide 25 MG tablet Commonly known as:  HYDRODIURIL Take 1 tablet (25 mg total) by mouth daily.   ibuprofen 200 MG tablet Commonly known as:  ADVIL,MOTRIN Take 200 mg by mouth every 6 (six) hours as needed. Takes 800 mg when needed for menstrual cramping   metFORMIN 500 MG tablet Commonly known as:  GLUCOPHAGE TAKE 1 TABLET BY MOUTH ONCE DAILY WITH BREAKFAST. NEEDS TO BE SEEN FOR REFILL   NEXIUM 40 MG capsule Generic drug:  esomeprazole Take 1 capsule (40 mg total) by mouth daily.   nicotine 21 mg/24hr patch Commonly known as:  NICODERM CQ - dosed in mg/24 hours APPLY ONE PATCH TOPICALLY ONCE DAILY   nitroGLYCERIN 0.4 MG SL tablet Commonly known as:  NITROSTAT DISSOLVE  ONE TABLET UNDER THE TONGUE EVERY 5 MINUTES AS NEEDED FOR CHEST PAIN.  DO NOT EXCEED A TOTAL OF 3 DOSES IN 15 MINUTES   onetouch ultrasoft lancets Use as instructed   phenazopyridine 100 MG tablet Commonly known as:  PYRIDIUM Take 1 tablet (100 mg total) by mouth 3 (three) times daily as needed for pain.   simvastatin 40 MG tablet Commonly known as:  ZOCOR TAKE 1 TABLET BY MOUTH ONCE DAILY MUST  BE  SEEN  FOR  ADDITIONAL  REFILLS   sulfamethoxazole-trimethoprim 800-160 MG tablet Commonly known as:  BACTRIM DS Take 1 tablet by mouth 2 (two) times daily.   zolpidem 10 MG tablet Commonly known as:  AMBIEN Take 1 tablet (10 mg total) by mouth at bedtime.          Objective:    BP 139/81   Pulse 98   Temp 97.9 F (36.6 C) (Oral)   Ht 5\' 2"  (1.575  m)   Wt 215 lb 9.6 oz (97.8 kg)   BMI 39.43 kg/m   No Known Allergies  Physical Exam  Constitutional: She is oriented to person, place, and time. She appears well-developed and well-nourished.  HENT:  Head: Normocephalic and atraumatic.  Eyes: Conjunctivae are normal. Pupils are equal, round, and reactive to light.  Cardiovascular: Normal rate, regular rhythm, normal heart sounds and intact distal pulses.  Pulmonary/Chest: Effort normal and breath sounds normal.  Abdominal: Soft. Bowel sounds are normal. She exhibits no distension and no mass. There is tenderness in the suprapubic area. There is no rebound, no guarding and no CVA tenderness.  Neurological: She is alert and oriented to person, place, and time. She has normal reflexes.  Skin: Skin is warm and dry. No rash noted.  Psychiatric: She has a normal mood and affect. Her behavior is normal. Judgment and thought content normal.    Results for orders placed or performed in visit on 02/08/17  Urine Culture  Result Value Ref Range   Urine Culture, Routine Preliminary report (A)    Organism ID, Bacteria Gram negative rods (A)   Microscopic Examination  Result Value Ref Range   WBC, UA >30 (A) 0 - 5 /hpf   RBC, UA 3-10 (A) 0 - 2 /hpf   Epithelial Cells (non renal) 0-10 0 - 10 /hpf   Renal Epithel, UA None seen None seen /hpf   Bacteria, UA Few (A) None seen/Few   Yeast, UA Present None seen  Urinalysis, Complete  Result Value Ref Range   Specific Gravity, UA 1.015 1.005 - 1.030   pH, UA 6.5 5.0 - 7.5   Color, UA Yellow Yellow   Appearance Ur Clear Clear   Leukocytes, UA 2+ (A) Negative   Protein, UA Negative Negative/Trace   Glucose, UA Negative Negative   Ketones, UA Negative Negative   RBC, UA 1+ (A) Negative   Bilirubin, UA Negative Negative   Urobilinogen, Ur 0.2 0.2 - 1.0 mg/dL   Nitrite, UA Negative Negative   Microscopic Examination See below:       Assessment & Plan:   1. Dysuria - Urine Culture -  Urinalysis, Complete - Microscopic Examination  2. Acute cystitis with hematuria - sulfamethoxazole-trimethoprim (BACTRIM DS) 800-160 MG tablet; Take 1 tablet by mouth 2 (two) times daily.  Dispense: 14 tablet; Refill: 0 - phenazopyridine (PYRIDIUM) 100 MG tablet; Take 1 tablet (100 mg total) by mouth 3 (three) times daily as needed for pain.  Dispense: 10 tablet; Refill: 0 - Microscopic  Examination    Current Outpatient Medications:  .  amLODipine (NORVASC) 10 MG tablet, Take 1 tablet (10 mg total) by mouth daily., Disp: 90 tablet, Rfl: 1 .  Aspirin-Acetaminophen-Caffeine (GOODYS EXTRA STRENGTH) 520-260-32.5 MG PACK, Take 1 packet by mouth 2 (two) times daily as needed. For pain or headache , Disp: , Rfl:  .  Blood Glucose Monitoring Suppl MISC, Check BS twice daily and prn, Disp: 1 each, Rfl: 0 .  busPIRone (BUSPAR) 7.5 MG tablet, TAKE 1 TABLET BY MOUTH ONCE DAILY. NEEDS TO BE SEEN FOR REFILL, Disp: 30 tablet, Rfl: 5 .  cyclobenzaprine (FLEXERIL) 10 MG tablet, Take 1 tablet (10 mg total) by mouth 3 (three) times daily., Disp: 20 tablet, Rfl: 0 .  diclofenac (VOLTAREN) 75 MG EC tablet, Take 1 tablet (75 mg total) by mouth 2 (two) times daily., Disp: 14 tablet, Rfl: 0 .  ezetimibe (ZETIA) 10 MG tablet, TAKE 1 TABLET BY MOUTH ONCE DAILY MUST  BE  SEEN  FOR  ADDITIONAL  REFILLS, Disp: 90 tablet, Rfl: 1 .  FLUoxetine (PROZAC) 20 MG capsule, Take 1 capsule (20 mg total) by mouth 2 (two) times daily., Disp: 180 capsule, Rfl: 1 .  glucose blood test strip, Use as instructed, Disp: 100 each, Rfl: 12 .  hydrochlorothiazide (HYDRODIURIL) 25 MG tablet, Take 1 tablet (25 mg total) by mouth daily., Disp: 90 tablet, Rfl: 1 .  ibuprofen (ADVIL,MOTRIN) 200 MG tablet, Take 200 mg by mouth every 6 (six) hours as needed. Takes 800 mg when needed for menstrual cramping, Disp: , Rfl:  .  Lancets (ONETOUCH ULTRASOFT) lancets, Use as instructed, Disp: 100 each, Rfl: 12 .  metFORMIN (GLUCOPHAGE) 500 MG tablet, TAKE  1 TABLET BY MOUTH ONCE DAILY WITH BREAKFAST. NEEDS TO BE SEEN FOR REFILL, Disp: 90 tablet, Rfl: 1 .  NEXIUM 40 MG capsule, Take 1 capsule (40 mg total) by mouth daily., Disp: 90 capsule, Rfl: 1 .  nicotine (NICODERM CQ - DOSED IN MG/24 HOURS) 21 mg/24hr patch, APPLY ONE PATCH TOPICALLY ONCE DAILY, Disp: 28 patch, Rfl: 2 .  nitroGLYCERIN (NITROSTAT) 0.4 MG SL tablet, DISSOLVE ONE TABLET UNDER THE TONGUE EVERY 5 MINUTES AS NEEDED FOR CHEST PAIN.  DO NOT EXCEED A TOTAL OF 3 DOSES IN 15 MINUTES, Disp: 25 tablet, Rfl: 0 .  phenazopyridine (PYRIDIUM) 100 MG tablet, Take 1 tablet (100 mg total) by mouth 3 (three) times daily as needed for pain., Disp: 10 tablet, Rfl: 0 .  simvastatin (ZOCOR) 40 MG tablet, TAKE 1 TABLET BY MOUTH ONCE DAILY MUST  BE  SEEN  FOR  ADDITIONAL  REFILLS, Disp: 30 tablet, Rfl: 0 .  sulfamethoxazole-trimethoprim (BACTRIM DS) 800-160 MG tablet, Take 1 tablet by mouth 2 (two) times daily., Disp: 14 tablet, Rfl: 0 .  zolpidem (AMBIEN) 10 MG tablet, Take 1 tablet (10 mg total) by mouth at bedtime., Disp: 30 tablet, Rfl: 2  Current Facility-Administered Medications:  .  penicillin v potassium (VEETID) tablet 500 mg, 500 mg, Oral, Q8H, Frederica KusterMiller, Stephen M, MD Continue all other maintenance medications as listed above.  Follow up plan: Return if symptoms worsen or fail to improve.  Educational handout given for survey  Remus LofflerAngel S. Jurrell Royster PA-C [facility] [address]  [place], [place] [id number] [phone]   02/10/2017, 9:48 PM

## 2017-03-04 ENCOUNTER — Other Ambulatory Visit: Payer: Self-pay | Admitting: Nurse Practitioner

## 2017-03-11 ENCOUNTER — Ambulatory Visit: Payer: Medicaid Other

## 2017-04-08 ENCOUNTER — Encounter: Payer: Self-pay | Admitting: Nurse Practitioner

## 2017-04-08 ENCOUNTER — Ambulatory Visit: Payer: Medicaid Other | Admitting: Nurse Practitioner

## 2017-04-08 VITALS — BP 144/84 | HR 93 | Temp 97.0°F | Ht 62.0 in | Wt 215.0 lb

## 2017-04-08 DIAGNOSIS — M7712 Lateral epicondylitis, left elbow: Secondary | ICD-10-CM | POA: Diagnosis not present

## 2017-04-08 NOTE — Progress Notes (Signed)
   Subjective:    Patient ID: Madison Gentry, female    DOB: October 07, 1965, 52 y.o.   MRN: [id number]  HPI Patient comes in today c/o left elbow pain starting to radiate upward and downward. Started about 1 week ago. She bought and OTC elbow brace which has heled a little.    Review of Systems  Constitutional: Negative.   Respiratory: Negative.   Cardiovascular: Negative.   Genitourinary: Negative.   Musculoskeletal: Positive for arthralgias (left elbow).  Neurological: Negative.   All other systems reviewed and are negative.      Objective:   Physical Exam  Constitutional: She is oriented to person, place, and time. She appears well-developed and well-nourished. No distress.  Cardiovascular: Normal rate.  Pulmonary/Chest: Effort normal.  Musculoskeletal:  Lateral epicondylar pain on left elbow.  Pain with making a fist FROM of left elbow without pain  Neurological: She is alert and oriented to person, place, and time.  Skin: Skin is warm.  Psychiatric: She has a normal mood and affect. Her behavior is normal. Judgment and thought content normal.   BP (!) 144/84   Pulse 93   Temp (!) 97 F (36.1 C) (Oral)   Ht 5\' 2"  (1.575 m)   Wt 215 lb (97.5 kg)   BMI 39.32 kg/m       Assessment & Plan:   1. Left lateral epicondylitis    Tennis elbow strap- wear 24/7 Moist heat bid Rest  rto prn  Mary-Margaret Daphine DeutscherMartin, FNP

## 2017-04-08 NOTE — Patient Instructions (Signed)
Tennis Elbow Tennis elbow is puffiness (inflammation) of the outer tendons of your forearm close to your elbow. Your tendons attach your muscles to your bones. Tennis elbow can happen in any sport or job in which you use your elbow too much. It is caused by doing the same motion over and over. Tennis elbow can cause:  Pain and tenderness in your forearm and the outer part of your elbow.  A burning feeling. This runs from your elbow through your arm.  Weak grip in your hands.  Follow these instructions at home: Activity  Rest your elbow and wrist as told by your doctor. Try to avoid any activities that caused the problem until your doctor says that you can do them again.  If a physical therapist teaches you exercises, do all of them as told.  If you lift an object, lift it with your palm facing up. This is easier on your elbow. Lifestyle  If your tennis elbow is caused by sports, check your equipment and make sure that: ? You are using it correctly. ? It fits you well.  If your tennis elbow is caused by work, take breaks often, if you are able. Talk with your manager about doing your work in a way that is safe for you. ? If your tennis elbow is caused by computer use, talk with your manager about any changes that can be made to your work setup. General instructions  If told, apply ice to the painful area: ? Put ice in a plastic bag. ? Place a towel between your skin and the bag. ? Leave the ice on for 20 minutes, 2-3 times per day.  Take medicines only as told by your doctor.  If you were given a brace, wear it as told by your doctor.  Keep all follow-up visits as told by your doctor. This is important. Contact a doctor if:  Your pain does not get better with treatment.  Your pain gets worse.  You have weakness in your forearm, hand, or fingers.  You cannot feel your forearm, hand, or fingers. This information is not intended to replace advice given to you by your health  care provider. Make sure you discuss any questions you have with your health care provider. Document Released: 08/02/2009 Document Revised: 10/13/2015 Document Reviewed: 02/08/2014 Elsevier Interactive Patient Education  2018 Elsevier Inc.  

## 2017-05-07 ENCOUNTER — Telehealth: Payer: Self-pay

## 2017-05-07 ENCOUNTER — Telehealth: Payer: Self-pay | Admitting: Nurse Practitioner

## 2017-05-07 DIAGNOSIS — F5101 Primary insomnia: Secondary | ICD-10-CM

## 2017-05-07 MED ORDER — ZOLPIDEM TARTRATE 10 MG PO TABS: 10.0000 mg | ORAL_TABLET | Freq: Every day | ORAL | 2 refills | Status: DC

## 2017-05-07 MED ORDER — OMEPRAZOLE 40 MG PO CPDR: 40.0000 mg | DELAYED_RELEASE_CAPSULE | Freq: Every day | ORAL | 3 refills | Status: DC

## 2017-05-07 NOTE — Telephone Encounter (Signed)
What is the name of the medication? Madison Gentry Has appt 3-15 with MMM and wants enough to get her through.  Have you contacted your pharmacy to request a refill? Yes  Which pharmacy would you like this sent to? [facility] in [place]   Patient notified that their request is being sent to the clinical staff for review and that they should receive a call once it is complete. If they do not receive a call within 24 hours they can check with their pharmacy or our office.

## 2017-05-07 NOTE — Telephone Encounter (Signed)
Medicaid non preferred Nexium cap  Preferred are esomeprazole cap., omeprazole cap., pantoprazole tab.

## 2017-05-10 ENCOUNTER — Ambulatory Visit: Payer: Medicaid Other | Admitting: Nurse Practitioner

## 2017-05-10 ENCOUNTER — Encounter: Payer: Self-pay | Admitting: Nurse Practitioner

## 2017-05-10 VITALS — BP 133/82 | HR 98 | Temp 97.2°F | Ht 62.0 in | Wt 215.0 lb

## 2017-05-10 DIAGNOSIS — E1142 Type 2 diabetes mellitus with diabetic polyneuropathy: Secondary | ICD-10-CM

## 2017-05-10 DIAGNOSIS — G8929 Other chronic pain: Secondary | ICD-10-CM

## 2017-05-10 DIAGNOSIS — F5101 Primary insomnia: Secondary | ICD-10-CM

## 2017-05-10 DIAGNOSIS — F329 Major depressive disorder, single episode, unspecified: Secondary | ICD-10-CM

## 2017-05-10 DIAGNOSIS — E559 Vitamin D deficiency, unspecified: Secondary | ICD-10-CM

## 2017-05-10 DIAGNOSIS — F3341 Major depressive disorder, recurrent, in partial remission: Secondary | ICD-10-CM

## 2017-05-10 DIAGNOSIS — E7800 Pure hypercholesterolemia, unspecified: Secondary | ICD-10-CM | POA: Diagnosis not present

## 2017-05-10 DIAGNOSIS — F32A Depression, unspecified: Secondary | ICD-10-CM

## 2017-05-10 DIAGNOSIS — F411 Generalized anxiety disorder: Secondary | ICD-10-CM

## 2017-05-10 DIAGNOSIS — J410 Simple chronic bronchitis: Secondary | ICD-10-CM | POA: Diagnosis not present

## 2017-05-10 DIAGNOSIS — I25119 Atherosclerotic heart disease of native coronary artery with unspecified angina pectoris: Secondary | ICD-10-CM

## 2017-05-10 DIAGNOSIS — I10 Essential (primary) hypertension: Secondary | ICD-10-CM

## 2017-05-10 DIAGNOSIS — K219 Gastro-esophageal reflux disease without esophagitis: Secondary | ICD-10-CM

## 2017-05-10 DIAGNOSIS — M545 Low back pain, unspecified: Secondary | ICD-10-CM

## 2017-05-10 DIAGNOSIS — Z720 Tobacco use: Secondary | ICD-10-CM | POA: Diagnosis not present

## 2017-05-10 DIAGNOSIS — N393 Stress incontinence (female) (male): Secondary | ICD-10-CM

## 2017-05-10 DIAGNOSIS — Z6838 Body mass index (BMI) 38.0-38.9, adult: Secondary | ICD-10-CM

## 2017-05-10 LAB — BAYER DCA HB A1C WAIVED: HB A1C: 6.6 % (ref <7.0)

## 2017-05-10 MED ORDER — ZOLPIDEM TARTRATE 10 MG PO TABS: 10.0000 mg | ORAL_TABLET | Freq: Every day | ORAL | 2 refills | Status: DC

## 2017-05-10 MED ORDER — SIMVASTATIN 40 MG PO TABS: ORAL_TABLET | ORAL | 1 refills | Status: DC

## 2017-05-10 MED ORDER — OMEPRAZOLE 40 MG PO CPDR: 40.0000 mg | DELAYED_RELEASE_CAPSULE | Freq: Every day | ORAL | 5 refills | Status: DC

## 2017-05-10 NOTE — Progress Notes (Signed)
Subjective:    Patient ID: Madison Gentry, female    DOB: [birth year], 52 y.o.   MRN: [id number]  HPI  Madison Gentry is here today for follow up of chronic medical problem.  Outpatient Encounter Medications as of 05/10/2017  Medication Sig  . amLODipine (NORVASC) 10 MG tablet Take 1 tablet (10 mg total) by mouth daily.  . Aspirin-Acetaminophen-Caffeine (GOODYS EXTRA STRENGTH) 520-260-32.5 MG PACK Take 1 packet by mouth 2 (two) times daily as needed. For pain or headache   . Blood Glucose Monitoring Suppl MISC Check BS twice daily and prn  . busPIRone (BUSPAR) 7.5 MG tablet TAKE 1 TABLET BY MOUTH ONCE DAILY. NEEDS TO BE SEEN FOR REFILL  . cyclobenzaprine (FLEXERIL) 10 MG tablet Take 1 tablet (10 mg total) by mouth 3 (three) times daily.  . diclofenac (VOLTAREN) 75 MG EC tablet Take 1 tablet (75 mg total) by mouth 2 (two) times daily.  Marland Kitchen ezetimibe (ZETIA) 10 MG tablet TAKE 1 TABLET BY MOUTH ONCE DAILY MUST  BE  SEEN  FOR  ADDITIONAL  REFILLS  . FLUoxetine (PROZAC) 20 MG capsule Take 1 capsule (20 mg total) by mouth 2 (two) times daily.  Marland Kitchen glucose blood test strip Use as instructed  . hydrochlorothiazide (HYDRODIURIL) 25 MG tablet Take 1 tablet (25 mg total) by mouth daily.  Marland Kitchen ibuprofen (ADVIL,MOTRIN) 200 MG tablet Take 200 mg by mouth every 6 (six) hours as needed. Takes 800 mg when needed for menstrual cramping  . Lancets (ONETOUCH ULTRASOFT) lancets Use as instructed  . metFORMIN (GLUCOPHAGE) 500 MG tablet TAKE 1 TABLET BY MOUTH ONCE DAILY WITH BREAKFAST. NEEDS TO BE SEEN FOR REFILL  . nicotine (NICODERM CQ - DOSED IN MG/24 HOURS) 21 mg/24hr patch APPLY ONE PATCH TOPICALLY ONCE DAILY  . nitroGLYCERIN (NITROSTAT) 0.4 MG SL tablet DISSOLVE ONE TABLET UNDER THE TONGUE EVERY 5 MINUTES AS NEEDED FOR CHEST PAIN.  DO NOT EXCEED A TOTAL OF 3 DOSES IN 15 MINUTES  . omeprazole (PRILOSEC) 40 MG capsule Take 1 capsule (40 mg total) by mouth daily.  . simvastatin (ZOCOR) 40 MG tablet TAKE 1 TABLET BY MOUTH ONCE  DAILY (MUST BE SEEN FOR REFILLS)  . zolpidem (AMBIEN) 10 MG tablet Take 1 tablet (10 mg total) by mouth at bedtime.     1. Atherosclerosis of native coronary artery of native heart with angina pectoris [facility])  has not seen cardiology in several years.  2. Essential hypertension  Does not c/o chest pain, sob or headache. Does not check blood pressure at home. BP Readings from Last 3 Encounters:  04/08/17 (!) 144/84  02/08/17 139/81  02/01/17 133/83     3. Simple chronic bronchitis ([place])  Is not having any problems . No cough  4. Gastroesophageal reflux disease without esophagitis  Omeprazole works well to keep symptoms under control  5. Type 2 diabetes mellitus with diabetic polyneuropathy, without long-term current use of insulin ([place]) last hgba1c 6.9%. Does not check blood sugar very often  6. Chronic midline low back pain without sciatica  Not on any pain medication  7. Recurrent major depressive disorder, in partial remission ([place])  currently on prozac. Working well for her Depression screen [facility] 2/9 05/10/2017 04/08/2017 02/08/2017  Decreased Interest 1 2 0  Down, Depressed, Hopeless 1 1 0  PHQ - 2 Score 2 3 0  Altered sleeping 1 3 -  Tired, decreased energy 1 3 -  Change in appetite 1 3 -  Feeling bad or failure about yourself  2 0 -  Trouble concentrating 2 0 -  Moving slowly or fidgety/restless 0 0 -  Suicidal thoughts 0 0 -  PHQ-9 Score 9 12 -     8. GAD (generalized anxiety disorder)  Stays stressed and is on buspar which sh esays works to keep her calm.  9. Pure hypercholesterolemia  Admits to not watching diet  10. Class 2 severe obesity due to excess calories with serious comorbidity and body mass index (BMI) of 38.0 to 38.9 in adult [facility])  No recent weight changes  11. Primary insomnia  Takes ambien to sleep at night  12. Tobacco user  Does not want to quit smoking. Smokes over a pack a day  13. Stress incontinence of urine  Currently not bothering her all  that much. Happens rarely  14. Vitamin D deficiency  Is not taking a vitamin supplement     New complaints: none  Social history: She does not work- lives with husband daughter and granddaughter    Review of Systems  Constitutional: Negative for activity change and appetite change.  HENT: Negative.   Eyes: Negative for pain.  Respiratory: Negative for shortness of breath.   Cardiovascular: Negative for chest pain, palpitations and leg swelling.  Gastrointestinal: Negative for abdominal pain.  Endocrine: Negative for polydipsia.  Genitourinary: Negative.   Skin: Negative for rash.  Neurological: Negative for dizziness, weakness and headaches.  Hematological: Does not bruise/bleed easily.  Psychiatric/Behavioral: Negative.   All other systems reviewed and are negative.      Objective:   Physical Exam  Constitutional: She is oriented to person, place, and time. She appears well-developed and well-nourished.  HENT:  Nose: Nose normal.  Mouth/Throat: Oropharynx is clear and moist.  Eyes: EOM are normal.  Neck: Trachea normal, normal range of motion and full passive range of motion without pain. Neck supple. No JVD present. Carotid bruit is not present. No thyromegaly present.  Cardiovascular: Normal rate, regular rhythm, normal heart sounds and intact distal pulses. Exam reveals no gallop and no friction rub.  No murmur heard. Pulmonary/Chest: Effort normal and breath sounds normal.  Abdominal: Soft. Bowel sounds are normal. She exhibits no distension and no mass. There is no tenderness.  Musculoskeletal: Normal range of motion.  Lymphadenopathy:    She has no cervical adenopathy.  Neurological: She is alert and oriented to person, place, and time. She has normal reflexes.  Skin: Skin is warm and dry.  Psychiatric: She has a normal mood and affect. Her behavior is normal. Judgment and thought content normal.   BP 133/82   Pulse 98   Temp (!) 97.2 F (36.2 C) (Oral)    Ht _0  (1.575 m)   Wt 215 lb (97.5 kg)   BMI 39.32 kg/m   hgba1c 6.6%       Assessment & Plan:  1. Atherosclerosis of native coronary artery of native heart with angina pectoris ([place]) Need to follow up with cardiology sometime this year  2. Essential hypertension Low sodium diet - CMP14+EGFR  3. Simple chronic bronchitis ([place]) Encouraged to stop smoking  4. Gastroesophageal reflux disease without esophagitis Avoid spicy foods Do not eat 2 hours prior to bedtime  5. Type 2 diabetes mellitus with diabetic polyneuropathy, without long-term current use of insulin (HCC) Continue to watch carbs in diet - Bayer DCA Hb A1c Waived  6. Chronic midline low back pain without sciatica Back exercises  7. Recurrent major depressive disorder, in partial remission ([place]) Stress management  8.  GAD (generalized anxiety disorder)  9. Pure hypercholesterolemia Low fta diet - Lipid panel  10. Class 2 severe obesity due to excess calories with serious comorbidity and body mass index (BMI) of 38.0 to 38.9 in adult [facility]) Discussed diet and exercise for person with BMI >25 Will recheck weight in 3-6 months  11. Primary insomnia Bedtime routine - zolpidem (AMBIEN) 10 MG tablet; Take 1 tablet (10 mg total) by mouth at bedtime.  Dispense: 30 tablet; Refill: 2  12. Tobacco user Encouraged to stop smoking  13. Stress incontinence of urine  14. Vitamin D deficiency    Labs pending Health maintenance reviewed Diet and exercise encouraged Continue all meds Follow up  In 6 months   [place], FNP

## 2017-05-10 NOTE — Patient Instructions (Signed)
Steps to Quit Smoking Smoking tobacco can be bad for your health. It can also affect almost every organ in your body. Smoking puts you and people around you at risk for many serious long-lasting (chronic) diseases. Quitting smoking is hard, but it is one of the best things that you can do for your health. It is never too late to quit. What are the benefits of quitting smoking? When you quit smoking, you lower your risk for getting serious diseases and conditions. They can include:  Lung cancer or lung disease.  Heart disease.  Stroke.  Heart attack.  Not being able to have children (infertility).  Weak bones (osteoporosis) and broken bones (fractures).  If you have coughing, wheezing, and shortness of breath, those symptoms may get better when you quit. You may also get sick less often. If you are pregnant, quitting smoking can help to lower your chances of having a baby of low birth weight. What can I do to help me quit smoking? Talk with your doctor about what can help you quit smoking. Some things you can do (strategies) include:  Quitting smoking totally, instead of slowly cutting back how much you smoke over a period of time.  Going to in-person counseling. You are more likely to quit if you go to many counseling sessions.  Using resources and support systems, such as: ? Online chats with a counselor. ? Phone quitlines. ? Printed self-help materials. ? Support groups or group counseling. ? Text messaging programs. ? Mobile phone apps or applications.  Taking medicines. Some of these medicines may have nicotine in them. If you are pregnant or breastfeeding, do not take any medicines to quit smoking unless your doctor says it is okay. Talk with your doctor about counseling or other things that can help you.  Talk with your doctor about using more than one strategy at the same time, such as taking medicines while you are also going to in-person counseling. This can help make  quitting easier. What things can I do to make it easier to quit? Quitting smoking might feel very hard at first, but there is a lot that you can do to make it easier. Take these steps:  Talk to your family and friends. Ask them to support and encourage you.  Call phone quitlines, reach out to support groups, or work with a counselor.  Ask people who smoke to not smoke around you.  Avoid places that make you want (trigger) to smoke, such as: ? Bars. ? Parties. ? Smoke-break areas at work.  Spend time with people who do not smoke.  Lower the stress in your life. Stress can make you want to smoke. Try these things to help your stress: ? Getting regular exercise. ? Deep-breathing exercises. ? Yoga. ? Meditating. ? Doing a body scan. To do this, close your eyes, focus on one area of your body at a time from head to toe, and notice which parts of your body are tense. Try to relax the muscles in those areas.  Download or buy apps on your mobile phone or tablet that can help you stick to your quit plan. There are many free apps, such as QuitGuide from the CDC (Centers for Disease Control and Prevention). You can find more support from smokefree.gov and other websites.  This information is not intended to replace advice given to you by your health care provider. Make sure you discuss any questions you have with your health care provider. Document Released: 12/09/2008 Document   Revised: 10/11/2015 Document Reviewed: 06/29/2014 Elsevier Interactive Patient Education  2018 Elsevier Inc.  

## 2017-05-11 LAB — CMP14+EGFR
ALBUMIN: 4.3 g/dL (ref 3.5–5.5)
ALK PHOS: 71 IU/L (ref 39–117)
ALT: 28 IU/L (ref 0–32)
AST: 25 IU/L (ref 0–40)
Albumin/Globulin Ratio: 1.7 (ref 1.2–2.2)
BUN / CREAT RATIO: 20 (ref 9–23)
BUN: 9 mg/dL (ref 6–24)
Bilirubin Total: <0.2 mg/dL (ref 0.0–1.2)
CO2: 26 mmol/L (ref 20–29)
CREATININE: 0.46 mg/dL — AB (ref 0.57–1.00)
Calcium: 9.3 mg/dL (ref 8.7–10.2)
Chloride: 99 mmol/L (ref 96–106)
GFR calc Af Amer: 135 mL/min/{1.73_m2} (ref >59)
GFR, EST NON AFRICAN AMERICAN: 117 mL/min/{1.73_m2} (ref >59)
GLUCOSE: 83 mg/dL (ref 65–99)
Globulin, Total: 2.6 g/dL (ref 1.5–4.5)
Potassium: 3.6 mmol/L (ref 3.5–5.2)
Sodium: 141 mmol/L (ref 134–144)
Total Protein: 6.9 g/dL (ref 6.0–8.5)

## 2017-05-11 LAB — LIPID PANEL
CHOL/HDL RATIO: 3.3 ratio (ref 0.0–4.4)
CHOLESTEROL TOTAL: 147 mg/dL (ref 100–199)
HDL: 45 mg/dL (ref >39)
LDL CALC: 56 mg/dL (ref 0–99)
TRIGLYCERIDES: 230 mg/dL — AB (ref 0–149)
VLDL CHOLESTEROL CAL: 46 mg/dL — AB (ref 5–40)

## 2017-06-07 ENCOUNTER — Telehealth: Payer: Self-pay | Admitting: Nurse Practitioner

## 2017-06-07 NOTE — Telephone Encounter (Signed)
Have you seen any paperwork on this?

## 2017-06-11 NOTE — Telephone Encounter (Signed)
Rx placed on provider's desk for signature 

## 2017-07-22 ENCOUNTER — Other Ambulatory Visit: Payer: Self-pay | Admitting: Nurse Practitioner

## 2017-07-22 DIAGNOSIS — E119 Type 2 diabetes mellitus without complications: Secondary | ICD-10-CM

## 2017-08-12 ENCOUNTER — Other Ambulatory Visit: Payer: Self-pay | Admitting: Nurse Practitioner

## 2017-08-12 DIAGNOSIS — E785 Hyperlipidemia, unspecified: Secondary | ICD-10-CM

## 2017-08-26 ENCOUNTER — Other Ambulatory Visit: Payer: Self-pay | Admitting: Nurse Practitioner

## 2017-08-26 DIAGNOSIS — F411 Generalized anxiety disorder: Secondary | ICD-10-CM

## 2017-08-27 NOTE — Telephone Encounter (Signed)
Ov 05/10/17 states 6 mos rck

## 2017-09-23 ENCOUNTER — Other Ambulatory Visit: Payer: Self-pay | Admitting: Nurse Practitioner

## 2017-09-23 DIAGNOSIS — F5101 Primary insomnia: Secondary | ICD-10-CM

## 2017-09-23 NOTE — Telephone Encounter (Signed)
Last refill without being seen 

## 2017-10-11 ENCOUNTER — Other Ambulatory Visit: Payer: Self-pay | Admitting: Nurse Practitioner

## 2017-10-11 DIAGNOSIS — I10 Essential (primary) hypertension: Secondary | ICD-10-CM

## 2017-10-23 ENCOUNTER — Other Ambulatory Visit: Payer: Self-pay | Admitting: Nurse Practitioner

## 2017-10-23 DIAGNOSIS — E119 Type 2 diabetes mellitus without complications: Secondary | ICD-10-CM

## 2017-10-24 NOTE — Telephone Encounter (Signed)
Ov 11/18/17 

## 2017-11-15 ENCOUNTER — Other Ambulatory Visit: Payer: Self-pay | Admitting: Nurse Practitioner

## 2017-11-15 DIAGNOSIS — E785 Hyperlipidemia, unspecified: Secondary | ICD-10-CM

## 2017-11-15 NOTE — Telephone Encounter (Signed)
Ov 11/18/17 

## 2017-11-18 ENCOUNTER — Encounter: Payer: Self-pay | Admitting: Nurse Practitioner

## 2017-11-18 ENCOUNTER — Ambulatory Visit: Payer: Medicaid Other | Admitting: Nurse Practitioner

## 2017-11-21 ENCOUNTER — Encounter: Payer: Self-pay | Admitting: Nurse Practitioner

## 2017-12-06 ENCOUNTER — Other Ambulatory Visit: Payer: Self-pay | Admitting: Nurse Practitioner

## 2017-12-06 DIAGNOSIS — F32A Depression, unspecified: Secondary | ICD-10-CM

## 2017-12-06 DIAGNOSIS — F329 Major depressive disorder, single episode, unspecified: Secondary | ICD-10-CM

## 2017-12-17 ENCOUNTER — Other Ambulatory Visit: Payer: Self-pay | Admitting: Nurse Practitioner

## 2017-12-17 DIAGNOSIS — F5101 Primary insomnia: Secondary | ICD-10-CM

## 2017-12-17 NOTE — Telephone Encounter (Signed)
Attempted to contact patient - NA °

## 2017-12-17 NOTE — Telephone Encounter (Signed)
Last refill without being seen 

## 2017-12-17 NOTE — Telephone Encounter (Signed)
Last seen 05/10/17  MMM 

## 2018-01-09 ENCOUNTER — Telehealth: Payer: Self-pay | Admitting: Nurse Practitioner

## 2018-01-09 ENCOUNTER — Encounter: Payer: Self-pay | Admitting: Nurse Practitioner

## 2018-01-09 ENCOUNTER — Ambulatory Visit (INDEPENDENT_AMBULATORY_CARE_PROVIDER_SITE_OTHER): Payer: Medicaid Other | Admitting: Nurse Practitioner

## 2018-01-09 DIAGNOSIS — E785 Hyperlipidemia, unspecified: Secondary | ICD-10-CM

## 2018-01-09 DIAGNOSIS — E7800 Pure hypercholesterolemia, unspecified: Secondary | ICD-10-CM

## 2018-01-09 DIAGNOSIS — F411 Generalized anxiety disorder: Secondary | ICD-10-CM

## 2018-01-09 DIAGNOSIS — I10 Essential (primary) hypertension: Secondary | ICD-10-CM

## 2018-01-09 DIAGNOSIS — J410 Simple chronic bronchitis: Secondary | ICD-10-CM

## 2018-01-09 DIAGNOSIS — K219 Gastro-esophageal reflux disease without esophagitis: Secondary | ICD-10-CM

## 2018-01-09 DIAGNOSIS — F3341 Major depressive disorder, recurrent, in partial remission: Secondary | ICD-10-CM

## 2018-01-09 DIAGNOSIS — F5101 Primary insomnia: Secondary | ICD-10-CM

## 2018-01-09 DIAGNOSIS — Z720 Tobacco use: Secondary | ICD-10-CM

## 2018-01-09 DIAGNOSIS — E1142 Type 2 diabetes mellitus with diabetic polyneuropathy: Secondary | ICD-10-CM

## 2018-01-09 DIAGNOSIS — M545 Low back pain, unspecified: Secondary | ICD-10-CM

## 2018-01-09 DIAGNOSIS — Z6838 Body mass index (BMI) 38.0-38.9, adult: Secondary | ICD-10-CM

## 2018-01-09 DIAGNOSIS — I25119 Atherosclerotic heart disease of native coronary artery with unspecified angina pectoris: Secondary | ICD-10-CM

## 2018-01-09 DIAGNOSIS — G8929 Other chronic pain: Secondary | ICD-10-CM

## 2018-01-09 LAB — BAYER DCA HB A1C WAIVED: HB A1C (BAYER DCA - WAIVED): 7.9 % — ABNORMAL HIGH (ref <7.0)

## 2018-01-09 MED ORDER — BUSPIRONE HCL 7.5 MG PO TABS: ORAL_TABLET | ORAL | 1 refills | Status: DC

## 2018-01-09 MED ORDER — FLUOXETINE HCL 40 MG PO CAPS: 40.0000 mg | ORAL_CAPSULE | Freq: Every day | ORAL | 1 refills | Status: DC

## 2018-01-09 MED ORDER — OMEPRAZOLE 40 MG PO CPDR: 40.0000 mg | DELAYED_RELEASE_CAPSULE | Freq: Every day | ORAL | 1 refills | Status: DC

## 2018-01-09 MED ORDER — EZETIMIBE 10 MG PO TABS: 10.0000 mg | ORAL_TABLET | Freq: Every day | ORAL | 1 refills | Status: DC

## 2018-01-09 MED ORDER — HYDROCHLOROTHIAZIDE 25 MG PO TABS: 25.0000 mg | ORAL_TABLET | Freq: Every day | ORAL | 1 refills | Status: DC

## 2018-01-09 MED ORDER — METFORMIN HCL 1000 MG PO TABS: 1000.0000 mg | ORAL_TABLET | Freq: Two times a day (BID) | ORAL | 1 refills | Status: DC

## 2018-01-09 MED ORDER — AMLODIPINE BESYLATE 10 MG PO TABS: 10.0000 mg | ORAL_TABLET | Freq: Every day | ORAL | 1 refills | Status: DC

## 2018-01-09 MED ORDER — SIMVASTATIN 40 MG PO TABS: 40.0000 mg | ORAL_TABLET | Freq: Every day | ORAL | 1 refills | Status: DC

## 2018-01-09 NOTE — Progress Notes (Signed)
Subjective:    Patient ID: Madison Gentry, female    DOB: 09/14/1965, 52 y.o.   MRN: [id number]   Chief Complaint: Medical Management of Chronic Issues   HPI:  1. Atherosclerosis of native coronary artery of native heart with angina pectoris [facility])  -last appt was in 2014 with Dr. Percival Spanish (cards)  2. Essential hypertension  -does not check BP at home -no c/o CP/SOB/HA -doest watch salt intake BP Readings from Last 3 Encounters:  01/09/18 129/80  05/10/17 133/82  04/08/17 (!) 144/84     3. Simple chronic bronchitis (HCC)  -no recent issues or flare ups -does not use an inhaler  4. Type 2 diabetes mellitus with diabetic polyneuropathy, without long-term current use of insulin (HCC)  -does not check blood sugars at home -sometimes has signs of low/high blood sugars -does not watch diet   5. Class 2 severe obesity due to excess calories with serious comorbidity and body mass index (BMI) of 38.0 to 38.9 in adult [facility])  -no recent weight changes -does not exercise  6. Recurrent major depressive disorder, in partial remission ([place])  -on prozac but beginning to worsen past 2 weeks Depression screen [facility] 2/9 01/09/2018 05/10/2017 04/08/2017  Decreased Interest '3 1 2  '$ Down, Depressed, Hopeless '3 1 1  '$ PHQ - 2 Score '6 2 3  '$ Altered sleeping '1 1 3  '$ Tired, decreased energy '3 1 3  '$ Change in appetite '3 1 3  '$ Feeling bad or failure about yourself  2 2 0  Trouble concentrating 1 2 0  Moving slowly or fidgety/restless 2 0 0  Suicidal thoughts 0 0 0  PHQ-9 Score '18 9 12    '$ 7. Gastroesophageal reflux disease without esophagitis  -symptoms controlled on Prilosec  8. Pure hypercholesterolemia  -tries to stick to a lowfat diet -tolerating statin OK  9. GAD (generalized anxiety disorder)  -symptoms currently under control (Buspar & Prozac)  10. Primary insomnia  -sleeps well with Ambien, has to take it every night  11. Chronic midline low back pain without sciatica  -average 7-8/10 daily,  worse in the AM, cold weather makes pain worse, pain meds makes better -has to take pain meds every 4-6 hours  12. Tobacco user  -smokes 1.5 ppd -wears a Nicotine patch    Outpatient Encounter Medications as of 01/09/2018  Medication Sig  . amLODipine (NORVASC) 10 MG tablet TAKE 1 TABLET BY MOUTH ONCE DAILY  . Aspirin-Acetaminophen-Caffeine (GOODYS EXTRA STRENGTH) 520-260-32.5 MG PACK Take 1 packet by mouth 2 (two) times daily as needed. For pain or headache   . Blood Glucose Monitoring Suppl MISC Check BS twice daily and prn  . busPIRone (BUSPAR) 7.5 MG tablet TAKE 1 TABLET BY MOUTH ONCE DAILY  . cyclobenzaprine (FLEXERIL) 10 MG tablet Take 1 tablet (10 mg total) by mouth 3 (three) times daily.  . diclofenac (VOLTAREN) 75 MG EC tablet Take 1 tablet (75 mg total) by mouth 2 (two) times daily.  Marland Kitchen ezetimibe (ZETIA) 10 MG tablet TAKE 1 TABLET BY MOUTH ONCE DAILY  . FLUoxetine (PROZAC) 20 MG capsule Take 1 capsule (20 mg total) by mouth 2 (two) times daily.  Marland Kitchen glucose blood test strip Use as instructed  . hydrochlorothiazide (HYDRODIURIL) 25 MG tablet TAKE 1 TABLET BY MOUTH ONCE DAILY  . ibuprofen (ADVIL,MOTRIN) 200 MG tablet Take 200 mg by mouth every 6 (six) hours as needed. Takes 800 mg when needed for menstrual cramping  . Lancets (ONETOUCH ULTRASOFT) lancets Use as instructed  .  metFORMIN (GLUCOPHAGE) 500 MG tablet TAKE 1 TABLET BY MOUTH IN THE MORNING WITH BREAKFAST  . nicotine (NICODERM CQ - DOSED IN MG/24 HOURS) 21 mg/24hr patch APPLY ONE PATCH TOPICALLY ONCE DAILY  . nitroGLYCERIN (NITROSTAT) 0.4 MG SL tablet DISSOLVE ONE TABLET UNDER THE TONGUE EVERY 5 MINUTES AS NEEDED FOR CHEST PAIN.  DO NOT EXCEED A TOTAL OF 3 DOSES IN 15 MINUTES  . omeprazole (PRILOSEC) 40 MG capsule Take 1 capsule (40 mg total) by mouth daily.  . simvastatin (ZOCOR) 40 MG tablet TAKE 1 TABLET BY MOUTH ONCE DAILY (MUST BE SEEN FOR REFILLS)  . zolpidem (AMBIEN) 10 MG tablet TAKE 1 TABLET BY MOUTH AT BEDTIME  .  [DISCONTINUED] FLUoxetine (PROZAC) 20 MG capsule TAKE 1 CAPSULE BY MOUTH TWICE DAILY   No facility-administered encounter medications on file as of 01/09/2018.       New complaints: Worsening depression over the last 2 weeks, everything is making her teary  Social history: Cares for her 48-monthold grandson    Review of Systems  Constitutional: Positive for activity change, appetite change and fatigue. Negative for chills, fever and unexpected weight change.  HENT: Positive for sore throat. Negative for congestion, ear pain (left), rhinorrhea, sinus pressure and sinus pain.   Eyes: Negative for pain, redness and visual disturbance.  Respiratory: Negative for cough, chest tightness, shortness of breath and wheezing.   Cardiovascular: Negative for chest pain, palpitations and leg swelling.  Gastrointestinal: Negative for abdominal pain, constipation, diarrhea, nausea and vomiting.  Endocrine: Negative for cold intolerance, heat intolerance, polydipsia, polyphagia and polyuria.  Genitourinary: Negative for difficulty urinating, dysuria and urgency.  Musculoskeletal: Positive for arthralgias and gait problem. Negative for joint swelling and myalgias.  Skin: Negative for rash and wound.  Allergic/Immunologic: Negative for environmental allergies and food allergies.  Neurological: Positive for numbness (bilateral hands when driving). Negative for dizziness, tremors and weakness.  Hematological: Does not bruise/bleed easily.  Psychiatric/Behavioral: Positive for sleep disturbance. Negative for behavioral problems, confusion, decreased concentration and suicidal ideas. The patient is not nervous/anxious.        Objective:   Physical Exam  Constitutional: She is oriented to person, place, and time. She appears well-developed and well-nourished.  HENT:  Head: Normocephalic and atraumatic.  Right Ear: External ear normal.  Left Ear: External ear normal.  Nose: Nose normal.    Mouth/Throat: Oropharynx is clear and moist. No oropharyngeal exudate.  Eyes: Pupils are equal, round, and reactive to light. Conjunctivae and EOM are normal.  Neck: Normal range of motion. Neck supple. No thyromegaly present.  Cardiovascular: Normal rate, regular rhythm, normal heart sounds and intact distal pulses.  Pulmonary/Chest: Effort normal and breath sounds normal.  Abdominal: Soft. Bowel sounds are normal. There is tenderness in the left upper quadrant.  Musculoskeletal: Normal range of motion.  Neurological: She is alert and oriented to person, place, and time. She displays normal reflexes. No cranial nerve deficit.  Skin: Skin is warm and dry.  Psychiatric: She has a normal mood and affect. Her behavior is normal. Judgment and thought content normal.  Nursing note and vitals reviewed.   BP 129/80   Pulse 94   Temp 97.7 F (36.5 C) (Oral)   Ht '5\' 2"'$  (1.575 m)   Wt 218 lb (98.9 kg)   BMI 39.87 kg/m   A1C today is 7.9     Assessment & Plan:  JDlynn Ranescomes in today with chief complaint of Medical Management of Chronic Issues   Diagnosis and  orders addressed:  1. Atherosclerosis of native coronary artery of native heart with angina pectoris [facility]) -encouraged follow-up with cardiology for aggressive risk reduction   2. Essential hypertension -low salt diet -check BP at home - CMP14+EGFR - hydrochlorothiazide (HYDRODIURIL) 25 MG tablet; Take 1 tablet (25 mg total) by mouth daily.  Dispense: 90 tablet; Refill: 1 - amLODipine (NORVASC) 10 MG tablet; Take 1 tablet (10 mg total) by mouth daily.  Dispense: 90 tablet; Refill: 1  3. Simple chronic bronchitis (HCC) -smoking cessation encouraged -avoid triggers  4. Type 2 diabetes mellitus with diabetic polyneuropathy, without long-term current use of insulin (HCC) -increase Metformin -strict carb control -check blood sugars daily and bring log to next appointment - [employer]ayer DCA Hb A1c Waived - metFORMIN (GLUCOPHAGE)  1000 MG tablet; Take 1 tablet (1,000 mg total) by mouth 2 (two) times daily with a meal.  Dispense: 180 tablet; Refill: 1  5. Class 2 severe obesity due to excess calories with serious comorbidity and body mass index (BMI) of 38.0 to 38.9 in adult [facility]) -dietary changes and exercise regimen encouraged  6. Recurrent major depressive disorder, in partial remission ([place]) -encouraged counseling services-pt will think about it -increase Prozac - FLUoxetine (PROZAC) 40 MG capsule; Take 1 capsule (40 mg total) by mouth daily.  Dispense: 90 capsule; Refill: 1  7. Gastroesophageal reflux disease without esophagitis -Avoid spicy foods Do not eat 2 hours prior to bedtime - omeprazole (PRILOSEC) 40 MG capsule; Take 1 capsule (40 mg total) by mouth daily.  Dispense: 90 capsule; Refill: 1  8. GAD (generalized anxiety disorder) -stress management - busPIRone (BUSPAR) 7.5 MG tablet; TAKE 1 TABLET BY MOUTH ONCE DAILY  Dispense: 90 tablet; Refill: 1  9 Primary insomnia -bedtime routine -sleep hygeine  10. Chronic midline low back pain without sciatica -continue current pain medication regimen  11. Tobacco user -smoking cessation  -Encouraged continued Nicotine patch use  12. HLD (hyperlipidemia) -Lipid panel -low fat diet - ezetimibe (ZETIA) 10 MG tablet; Take 1 tablet (10 mg total) by mouth daily. TAKE 1 TABLET BY MOUTH ONCE DAILY  Dispense: 90 tablet; Refill: 1 - simvastatin (ZOCOR) 40 MG tablet; Take 1 tablet (40 mg total) by mouth daily at 6 PM.  Dispense: 90 tablet; Refill: 1   Labs pending Health Maintenance reviewed Diet and exercise encouraged  Follow up plan:  3 months   Mary-Margaret Hassell Done, FNP

## 2018-01-09 NOTE — Patient Instructions (Signed)

## 2018-01-09 NOTE — Telephone Encounter (Signed)
Patient had been taking Prozac 20 mg bid, when she was seen today she was to increase to 40 mg bid.  Her prescription was sent in as 40 mg daily.  Patient will take two of her capsules daily in the meantime and would like for you to send in corrected prescription.

## 2018-01-10 LAB — CMP14+EGFR
ALT: 32 IU/L (ref 0–32)
AST: 19 IU/L (ref 0–40)
Albumin/Globulin Ratio: 1.6 (ref 1.2–2.2)
Albumin: 3.8 g/dL (ref 3.5–5.5)
Alkaline Phosphatase: 65 IU/L (ref 39–117)
BUN/Creatinine Ratio: 21 (ref 9–23)
BUN: 9 mg/dL (ref 6–24)
CHLORIDE: 100 mmol/L (ref 96–106)
CO2: 24 mmol/L (ref 20–29)
Calcium: 9.1 mg/dL (ref 8.7–10.2)
Creatinine, Ser: 0.43 mg/dL — ABNORMAL LOW (ref 0.57–1.00)
GFR calc non Af Amer: 119 mL/min/{1.73_m2} (ref >59)
GFR, EST AFRICAN AMERICAN: 137 mL/min/{1.73_m2} (ref >59)
Globulin, Total: 2.4 g/dL (ref 1.5–4.5)
Glucose: 200 mg/dL — ABNORMAL HIGH (ref 65–99)
Potassium: 4.3 mmol/L (ref 3.5–5.2)
Sodium: 139 mmol/L (ref 134–144)
TOTAL PROTEIN: 6.2 g/dL (ref 6.0–8.5)

## 2018-01-10 LAB — LIPID PANEL
CHOLESTEROL TOTAL: 122 mg/dL (ref 100–199)
Chol/HDL Ratio: 3.1 ratio (ref 0.0–4.4)
HDL: 39 mg/dL — AB (ref >39)
LDL Calculated: 57 mg/dL (ref 0–99)
Triglycerides: 131 mg/dL (ref 0–149)
VLDL CHOLESTEROL CAL: 26 mg/dL (ref 5–40)

## 2018-01-10 MED ORDER — FLUOXETINE HCL 40 MG PO CAPS: 40.0000 mg | ORAL_CAPSULE | Freq: Two times a day (BID) | ORAL | 1 refills | Status: DC

## 2018-01-10 NOTE — Telephone Encounter (Signed)
New rx sent to pharmacy for BID

## 2018-01-11 ENCOUNTER — Telehealth: Payer: Self-pay | Admitting: Nurse Practitioner

## 2018-01-11 NOTE — Telephone Encounter (Signed)
Been this dose x 2 days  Please advise = MMM pt.

## 2018-01-11 NOTE — Telephone Encounter (Signed)
Pt called - she states that she is certain that it is her metformin - she went from 500 QD to 100 mg BID. She we go back down to 500 mg qd over the weekend and touch base with MMM on Monday regarding further changes.

## 2018-01-11 NOTE — Telephone Encounter (Signed)
Does the patient feel like the metformin is definitely the thing upsetting her stomach?  Or does she does have a stomach virus and needs to get through a couple of days and it improved?  A suggestion would be to lower the metformin back down to the previous dose, and see if that settles the stomach if she feels like it is related to the metformin dosing.  And in the 200s is not an emergent level concern.

## 2018-01-13 ENCOUNTER — Telehealth: Payer: Self-pay | Admitting: Nurse Practitioner

## 2018-01-13 ENCOUNTER — Other Ambulatory Visit: Payer: Self-pay

## 2018-01-13 MED ORDER — GLUCOSE BLOOD VI STRP: ORAL_STRIP | 12 refills | Status: DC

## 2018-01-13 MED ORDER — ONETOUCH ULTRASOFT LANCETS MISC: 12 refills | Status: DC

## 2018-01-13 NOTE — Telephone Encounter (Signed)
Please review and advise.

## 2018-01-13 NOTE — Telephone Encounter (Signed)
We do not have test strips to give her

## 2018-01-13 NOTE — Telephone Encounter (Signed)
This can be sent to her PCP today

## 2018-01-13 NOTE — Telephone Encounter (Signed)
Passing this information on to you. She was to get in touch with you.

## 2018-01-13 NOTE — Telephone Encounter (Signed)
Left message to call back  

## 2018-01-13 NOTE — Telephone Encounter (Signed)
Another call came in

## 2018-01-13 NOTE — Telephone Encounter (Signed)
Need to at least do 500mg  3x a day if cannot do 1000 bid- sh ecan break th e1000 in half

## 2018-01-13 NOTE — Telephone Encounter (Signed)
Have already responded to this earlier today.she need s to take 500mg  TID and see if that helps with nausea- she can break 500 mg in half

## 2018-01-13 NOTE — Telephone Encounter (Signed)
PT has called back about the change in her metformin and that her sugar is still running high and she is out of her test strips and wants to know if we can give her test strips and needles bc she doesn't have the money to go get any.

## 2018-01-14 ENCOUNTER — Telehealth: Payer: Self-pay

## 2018-01-14 MED ORDER — ACCU-CHEK AVIVA PLUS W/DEVICE KIT: PACK | 0 refills | Status: DC

## 2018-01-14 MED ORDER — ACCU-CHEK GUIDE W/DEVICE KIT: 1.0000 | PACK | Freq: Every day | 0 refills | Status: DC | PRN

## 2018-01-14 MED ORDER — GLUCOSE BLOOD VI STRP: ORAL_STRIP | 3 refills | Status: DC

## 2018-01-14 MED ORDER — ACCU-CHEK SOFTCLIX LANCETS MISC: 3 refills | Status: DC

## 2018-01-14 MED ORDER — GLUCOSE BLOOD VI STRP: ORAL_STRIP | 12 refills | Status: DC

## 2018-01-14 NOTE — Telephone Encounter (Signed)
rx for glucose testing machine and strips sent to pharmacy

## 2018-01-14 NOTE — Addendum Note (Signed)
Addended by: Julious PayerHOLT, Donevan Biller D on: 01/14/2018 04:25 PM   Modules accepted: Orders

## 2018-01-14 NOTE — Telephone Encounter (Signed)
Patient aware and verbalizes understanding. 

## 2018-01-14 NOTE — Telephone Encounter (Signed)
Medicaid non preferred all One Touch products   Requesting strips   Accu Chek is preferred

## 2018-01-14 NOTE — Telephone Encounter (Signed)
LM rx sent

## 2018-01-14 NOTE — Telephone Encounter (Signed)
At the beginning of the year [facility]

## 2018-01-16 ENCOUNTER — Other Ambulatory Visit: Payer: Self-pay | Admitting: Nurse Practitioner

## 2018-01-16 DIAGNOSIS — F5101 Primary insomnia: Secondary | ICD-10-CM

## 2018-01-16 NOTE — Telephone Encounter (Signed)
Busy x 2 jkp 11/21

## 2018-01-17 NOTE — Telephone Encounter (Signed)
Last seen 12/2017 last filled 12/20/2017

## 2018-01-17 NOTE — Telephone Encounter (Signed)
Last seen 01/09/18  MMM

## 2018-01-17 NOTE — Telephone Encounter (Signed)
Duplicate call.

## 2018-01-31 ENCOUNTER — Other Ambulatory Visit: Payer: Self-pay | Admitting: Family Medicine

## 2018-01-31 ENCOUNTER — Telehealth: Payer: Self-pay | Admitting: Nurse Practitioner

## 2018-01-31 DIAGNOSIS — B850 Pediculosis due to Pediculus humanus capitis: Secondary | ICD-10-CM

## 2018-01-31 MED ORDER — PERMETHRIN 1 % EX LIQD: Freq: Once | CUTANEOUS | 0 refills | Status: AC

## 2018-01-31 NOTE — Telephone Encounter (Signed)
Please advise on medication being filled 

## 2018-01-31 NOTE — Telephone Encounter (Signed)
Rx sent to [facility]

## 2018-01-31 NOTE — Telephone Encounter (Signed)
Pt is wanting to have head lice kit sent to [facility]

## 2018-01-31 NOTE — Telephone Encounter (Signed)
Pt aware.

## 2018-04-15 ENCOUNTER — Other Ambulatory Visit: Payer: Self-pay | Admitting: Nurse Practitioner

## 2018-04-15 DIAGNOSIS — F5101 Primary insomnia: Secondary | ICD-10-CM

## 2018-04-17 ENCOUNTER — Other Ambulatory Visit: Payer: Self-pay | Admitting: Nurse Practitioner

## 2018-04-17 DIAGNOSIS — F5101 Primary insomnia: Secondary | ICD-10-CM

## 2018-04-18 ENCOUNTER — Other Ambulatory Visit: Payer: Self-pay

## 2018-04-18 DIAGNOSIS — F5101 Primary insomnia: Secondary | ICD-10-CM

## 2018-04-18 MED ORDER — ZOLPIDEM TARTRATE 10 MG PO TABS: 10.0000 mg | ORAL_TABLET | Freq: Every day | ORAL | 0 refills | Status: DC

## 2018-04-18 NOTE — Telephone Encounter (Signed)
Last seen 01/09/18

## 2018-04-22 ENCOUNTER — Ambulatory Visit: Payer: Medicaid Other | Admitting: Nurse Practitioner

## 2018-04-22 ENCOUNTER — Encounter: Payer: Self-pay | Admitting: Nurse Practitioner

## 2018-04-22 VITALS — BP 133/78 | HR 90 | Temp 97.0°F | Ht 62.0 in | Wt 214.0 lb

## 2018-04-22 DIAGNOSIS — F3341 Major depressive disorder, recurrent, in partial remission: Secondary | ICD-10-CM

## 2018-04-22 DIAGNOSIS — K219 Gastro-esophageal reflux disease without esophagitis: Secondary | ICD-10-CM

## 2018-04-22 DIAGNOSIS — E7800 Pure hypercholesterolemia, unspecified: Secondary | ICD-10-CM | POA: Diagnosis not present

## 2018-04-22 DIAGNOSIS — J410 Simple chronic bronchitis: Secondary | ICD-10-CM

## 2018-04-22 DIAGNOSIS — F411 Generalized anxiety disorder: Secondary | ICD-10-CM

## 2018-04-22 DIAGNOSIS — I10 Essential (primary) hypertension: Secondary | ICD-10-CM

## 2018-04-22 DIAGNOSIS — Z6838 Body mass index (BMI) 38.0-38.9, adult: Secondary | ICD-10-CM

## 2018-04-22 DIAGNOSIS — N393 Stress incontinence (female) (male): Secondary | ICD-10-CM

## 2018-04-22 DIAGNOSIS — E1142 Type 2 diabetes mellitus with diabetic polyneuropathy: Secondary | ICD-10-CM

## 2018-04-22 DIAGNOSIS — F5101 Primary insomnia: Secondary | ICD-10-CM

## 2018-04-22 DIAGNOSIS — Z720 Tobacco use: Secondary | ICD-10-CM

## 2018-04-22 LAB — CMP14+EGFR
ALT: 33 IU/L — AB (ref 0–32)
AST: 24 IU/L (ref 0–40)
Albumin/Globulin Ratio: 1.6 (ref 1.2–2.2)
Albumin: 4.2 g/dL (ref 3.8–4.8)
Alkaline Phosphatase: 76 IU/L (ref 39–117)
BUN/Creatinine Ratio: 17 (ref 9–23)
BUN: 9 mg/dL (ref 6–24)
Bilirubin Total: <0.2 mg/dL (ref 0.0–1.2)
CO2: 25 mmol/L (ref 20–29)
CREATININE: 0.53 mg/dL — AB (ref 0.57–1.00)
Calcium: 9.5 mg/dL (ref 8.7–10.2)
Chloride: 99 mmol/L (ref 96–106)
GFR calc Af Amer: 128 mL/min/{1.73_m2} (ref >59)
GFR calc non Af Amer: 111 mL/min/{1.73_m2} (ref >59)
Globulin, Total: 2.6 g/dL (ref 1.5–4.5)
Glucose: 202 mg/dL — ABNORMAL HIGH (ref 65–99)
Potassium: 4 mmol/L (ref 3.5–5.2)
Sodium: 139 mmol/L (ref 134–144)
Total Protein: 6.8 g/dL (ref 6.0–8.5)

## 2018-04-22 LAB — LIPID PANEL
Chol/HDL Ratio: 3.4 ratio (ref 0.0–4.4)
Cholesterol, Total: 135 mg/dL (ref 100–199)
HDL: 40 mg/dL (ref >39)
LDL CALC: 50 mg/dL (ref 0–99)
Triglycerides: 224 mg/dL — ABNORMAL HIGH (ref 0–149)
VLDL Cholesterol Cal: 45 mg/dL — ABNORMAL HIGH (ref 5–40)

## 2018-04-22 LAB — BAYER DCA HB A1C WAIVED: HB A1C (BAYER DCA - WAIVED): 6.7 % (ref <7.0)

## 2018-04-22 MED ORDER — ZOLPIDEM TARTRATE 10 MG PO TABS: 10.0000 mg | ORAL_TABLET | Freq: Every day | ORAL | 2 refills | Status: DC

## 2018-04-22 MED ORDER — FLUOXETINE HCL 40 MG PO CAPS: 40.0000 mg | ORAL_CAPSULE | Freq: Two times a day (BID) | ORAL | 1 refills | Status: DC

## 2018-04-22 MED ORDER — OMEPRAZOLE 40 MG PO CPDR: 40.0000 mg | DELAYED_RELEASE_CAPSULE | Freq: Every day | ORAL | 1 refills | Status: DC

## 2018-04-22 MED ORDER — EZETIMIBE 10 MG PO TABS: 10.0000 mg | ORAL_TABLET | Freq: Every day | ORAL | 1 refills | Status: DC

## 2018-04-22 MED ORDER — BUSPIRONE HCL 7.5 MG PO TABS: ORAL_TABLET | ORAL | 1 refills | Status: DC

## 2018-04-22 MED ORDER — BLOOD GLUCOSE METER KIT: PACK | 0 refills | Status: DC

## 2018-04-22 MED ORDER — AMLODIPINE BESYLATE 10 MG PO TABS: 10.0000 mg | ORAL_TABLET | Freq: Every day | ORAL | 1 refills | Status: DC

## 2018-04-22 MED ORDER — HYDROCHLOROTHIAZIDE 25 MG PO TABS: 25.0000 mg | ORAL_TABLET | Freq: Every day | ORAL | 1 refills | Status: DC

## 2018-04-22 MED ORDER — SIMVASTATIN 40 MG PO TABS: 40.0000 mg | ORAL_TABLET | Freq: Every day | ORAL | 1 refills | Status: DC

## 2018-04-22 MED ORDER — METFORMIN HCL 1000 MG PO TABS: 1000.0000 mg | ORAL_TABLET | Freq: Two times a day (BID) | ORAL | 1 refills | Status: DC

## 2018-04-22 NOTE — Patient Instructions (Signed)

## 2018-04-22 NOTE — Progress Notes (Signed)
 Subjective:    Patient ID: Madison Gentry, female    DOB: 01/04/1966, 53 y.o.   MRN: [id number]   Chief Complaint: Medical Management of Chronic Issues (Leg pain and incontinence)   HPI:  1. Essential hypertension  No c/o chest pain, or SOB. But she is having daily headaches. This is not something new for her. BP Readings from Last 3 Encounters:  04/22/18 133/78  01/09/18 129/80  05/10/17 133/82     2. Type 2 diabetes mellitus with diabetic polyneuropathy, without long-term current use of insulin (HCC) last hgba1c was 7.9%. we increased metformin to 1000mg BID but she could not tolerate so she decreased it to 500 BID. She does not ceck blood sugars at home.  3. Pure hypercholesterolemia  Has not been wathing diet and does no exercise  4. Simple chronic bronchitis (HCC)  She is on no inhalrs. She denies cough  5. Gastroesophageal reflux disease without esophagitis  She says omeprazole is keeping symptoms under control.  6. Primary insomnia  She cannot sleep without ambien. She has been without it for over a week and has not slept well at all.  7. Tobacco user  She smokes over a 1/2 a pack a day. She wears nictine patch  8. GAD (generalized anxiety disorder)  Stays anxious. buspar helps  9. Recurrent major depressive disorder, in partial remission (HCC) Is on prozac daily works well for her. Depression screen PHQ 2/9 04/22/2018 01/09/2018 01/09/2018  Decreased Interest 1 1 3  Down, Depressed, Hopeless 1 3 3  PHQ - 2 Score 2 4 6  Altered sleeping 1 3 1  Tired, decreased energy 1 3 3  Change in appetite 1 3 3  Feeling bad or failure about yourself  0 1 2  Trouble concentrating 1 1 1  Moving slowly or fidgety/restless 0 0 2  Suicidal thoughts 0 0 0  PHQ-9 Score 6 15 18  Difficult doing work/chores - Somewhat difficult -  Some recent data might be hidden      10. Class 2 severe obesity due to excess calories with serious comorbidity and body mass index (BMI) of 38.0 to 38.9 in  adult (HCC)  No recent weight chnages    Outpatient Encounter Medications as of 04/22/2018  Medication Sig  . ACCU-CHEK SOFTCLIX LANCETS lancets Test 1x per and prn  . amLODipine (NORVASC) 10 MG tablet Take 1 tablet (10 mg total) by mouth daily.  . Aspirin-Acetaminophen-Caffeine (GOODYS EXTRA STRENGTH) 520-260-32.5 MG PACK Take 1 packet by mouth 2 (two) times daily as needed. For pain or headache   . Blood Glucose Monitoring Suppl (ACCU-CHEK GUIDE) w/Device KIT 1 kit by Does not apply route daily as needed.  . busPIRone (BUSPAR) 7.5 MG tablet TAKE 1 TABLET BY MOUTH ONCE DAILY  . cyclobenzaprine (FLEXERIL) 10 MG tablet Take 1 tablet (10 mg total) by mouth 3 (three) times daily.  . diclofenac (VOLTAREN) 75 MG EC tablet Take 1 tablet (75 mg total) by mouth 2 (two) times daily.  . ezetimibe (ZETIA) 10 MG tablet Take 1 tablet (10 mg total) by mouth daily. TAKE 1 TABLET BY MOUTH ONCE DAILY  . FLUoxetine (PROZAC) 40 MG capsule Take 1 capsule (40 mg total) by mouth 2 (two) times daily.  . glucose blood (ACCU-CHEK GUIDE) test strip Test 1x per and prn  . hydrochlorothiazide (HYDRODIURIL) 25 MG tablet Take 1 tablet (25 mg total) by mouth daily.  . ibuprofen (ADVIL,MOTRIN) 200 MG tablet Take 200 mg by mouth every 6 (  six) hours as needed. Takes 800 mg when needed for menstrual cramping  . metFORMIN (GLUCOPHAGE) 1000 MG tablet Take 1 tablet (1,000 mg total) by mouth 2 (two) times daily with a meal.  . nicotine (NICODERM CQ - DOSED IN MG/24 HOURS) 21 mg/24hr patch APPLY ONE PATCH TOPICALLY ONCE DAILY  . nitroGLYCERIN (NITROSTAT) 0.4 MG SL tablet DISSOLVE ONE TABLET UNDER THE TONGUE EVERY 5 MINUTES AS NEEDED FOR CHEST PAIN.  DO NOT EXCEED A TOTAL OF 3 DOSES IN 15 MINUTES  . omeprazole (PRILOSEC) 40 MG capsule Take 1 capsule (40 mg total) by mouth daily.  . simvastatin (ZOCOR) 40 MG tablet Take 1 tablet (40 mg total) by mouth daily at 6 PM.  . zolpidem (AMBIEN) 10 MG tablet Take 1 tablet (10 mg total) by mouth  at bedtime.       New complaints: -Having swelling bil lower ext for about 2 weeks now.  - urinary incontinence is worsening- would like to see specialist.  Social history: Lives with hr granddaughter and daughter. Her Childrens father lives there as well, but re not married.   Review of Systems  Constitutional: Negative for activity change and appetite change.  HENT: Negative.   Eyes: Negative for pain.  Respiratory: Negative for shortness of breath.   Cardiovascular: Positive for leg swelling. Negative for chest pain and palpitations.  Gastrointestinal: Negative for abdominal pain.  Endocrine: Negative for polydipsia.  Genitourinary: Negative.   Skin: Negative for rash.  Neurological: Negative for dizziness, weakness and headaches.  Hematological: Does not bruise/bleed easily.  Psychiatric/Behavioral: Negative.   All other systems reviewed and are negative.      Objective:   Physical Exam Vitals signs and nursing note reviewed.  Constitutional:      General: She is not in acute distress.    Appearance: Normal appearance. She is well-developed.  HENT:     Head: Normocephalic.     Nose: Nose normal.  Eyes:     Pupils: Pupils are equal, round, and reactive to light.  Neck:     Musculoskeletal: Normal range of motion and neck supple.     Vascular: No carotid bruit or JVD.  Cardiovascular:     Rate and Rhythm: Normal rate and regular rhythm.     Heart sounds: Normal heart sounds.  Pulmonary:     Effort: Pulmonary effort is normal. No respiratory distress.     Breath sounds: Normal breath sounds. No wheezing or rales.  Chest:     Chest wall: No tenderness.  Abdominal:     General: Bowel sounds are normal. There is no distension or abdominal bruit.     Palpations: Abdomen is soft. There is no hepatomegaly, splenomegaly, mass or pulsatile mass.     Tenderness: There is no abdominal tenderness.  Musculoskeletal: Normal range of motion.     Right lower leg: Edema  (1+) present.     Left lower leg: Edema (1+) present.  Lymphadenopathy:     Cervical: No cervical adenopathy.  Skin:    General: Skin is warm and dry.  Neurological:     Mental Status: She is alert and oriented to person, place, and time.     Deep Tendon Reflexes: Reflexes are normal and symmetric.  Psychiatric:        Behavior: Behavior normal.        Thought Content: Thought content normal.        Judgment: Judgment normal.    BP 133/78   Pulse 90   Temp Marland Kitchen)  97 F (36.1 C) (Oral)   Ht 5' 2" (1.575 m)   Wt 214 lb (97.1 kg)   BMI 39.14 kg/m   HGBA1c 6.7%       Assessment & Plan:  Akua Garn comes in today with chief complaint of Medical Management of Chronic Issues (Leg pain and incontinence)   Diagnosis and orders addressed:  1. Essential hypertension Low sodium diet - CMP14+EGFR - hydrochlorothiazide (HYDRODIURIL) 25 MG tablet; Take 1 tablet (25 mg total) by mouth daily.  Dispense: 90 tablet; Refill: 1 - amLODipine (NORVASC) 10 MG tablet; Take 1 tablet (10 mg total) by mouth daily.  Dispense: 90 tablet; Refill: 1  2. Type 2 diabetes mellitus with diabetic polyneuropathy, without long-term current use of insulin (HCC) Continue to wtach carbs in diet rx written for new glucose meter - Bayer DCA Hb A1c Waived - Microalbumin / creatinine urine ratio - metFORMIN (GLUCOPHAGE) 1000 MG tablet; Take 1 tablet (1,000 mg total) by mouth 2 (two) times daily with a meal.  Dispense: 180 tablet; Refill: 1  3. Pure hypercholesterolemia Low fat diet - Lipid panel- simvastatin (ZOCOR) 40 MG tablet; Take 1 tablet (40 mg total) by mouth daily at 6 PM.  Dispense: 90 tablet; Refill: 1 - ezetimibe (ZETIA) 10 MG tablet; Take 1 tablet (10 mg total) by mouth daily. TAKE 1 TABLET BY MOUTH ONCE DAILY  Dispense: 90 tablet; Refill: 1  4. Simple chronic bronchitis (HCC)  5. Gastroesophageal reflux disease without esophagitis Avoid spicy foods Do not eat 2 hours prior to bedtime -  omeprazole (PRILOSEC) 40 MG capsule; Take 1 capsule (40 mg total) by mouth daily.  Dispense: 90 capsule; Refill: 1  6. Primary insomnia Bedtime routine - zolpidem (AMBIEN) 10 MG tablet; Take 1 tablet (10 mg total) by mouth at bedtime.  Dispense: 30 tablet; Refill: 2  7. Tobacco user Smoking cesatio encourgaed  8. GAD (generalized anxiety disorder) Stress management - busPIRone (BUSPAR) 7.5 MG tablet; TAKE 1 TABLET BY MOUTH ONCE DAILY  Dispense: 90 tablet; Refill: 1  9. Recurrent major depressive disorder, in partial remission (HCC) - FLUoxetine (PROZAC) 40 MG capsule; Take 1 capsule (40 mg total) by mouth 2 (two) times daily.  Dispense: 180 capsule; Refill: 1  10. Class 2 severe obesity due to excess calories with serious comorbidity and body mass index (BMI) of 38.0 to 38.9 in adult (HCC) Discussed diet and exercise for person with BMI >25 Will recheck weight in 3-6 months  11. Stress incontinence of urine - Ambulatory referral to Urology  12. Peripheral edema - do not want to change from HCTZ to lasix until can get urinary incontinenece straghtened out.  - compression socks daily elevat legs when sitting.  Patient will make appointment for eye exam willl do pap at next visit Will schedule mammogram Will discuss colonoscopy at next visit. Labs pending Health Maintenance reviewed Diet and exercise encouraged  Follow up plan: 3 months   Mary-Margaret Martin, FNP  

## 2018-04-23 LAB — MICROALBUMIN / CREATININE URINE RATIO
Creatinine, Urine: 31.2 mg/dL
Microalb/Creat Ratio: 14 mg/g creat (ref 0–29)
Microalbumin, Urine: 4.3 ug/mL

## 2018-04-23 MED ORDER — LISINOPRIL 10 MG PO TABS: 10.0000 mg | ORAL_TABLET | Freq: Every day | ORAL | 3 refills | Status: DC

## 2018-04-23 NOTE — Addendum Note (Signed)
Addended by: Bennie Pierini on: 04/23/2018 03:11 PM   Modules accepted: Orders

## 2018-05-14 ENCOUNTER — Other Ambulatory Visit: Payer: Self-pay | Admitting: Nurse Practitioner

## 2018-05-14 DIAGNOSIS — F411 Generalized anxiety disorder: Secondary | ICD-10-CM

## 2018-07-15 ENCOUNTER — Encounter: Payer: Self-pay | Admitting: Nurse Practitioner

## 2018-07-15 ENCOUNTER — Other Ambulatory Visit: Payer: Self-pay

## 2018-07-15 ENCOUNTER — Ambulatory Visit (INDEPENDENT_AMBULATORY_CARE_PROVIDER_SITE_OTHER): Payer: Medicaid Other | Admitting: Nurse Practitioner

## 2018-07-15 DIAGNOSIS — F5101 Primary insomnia: Secondary | ICD-10-CM

## 2018-07-15 DIAGNOSIS — F3341 Major depressive disorder, recurrent, in partial remission: Secondary | ICD-10-CM

## 2018-07-15 DIAGNOSIS — Z6838 Body mass index (BMI) 38.0-38.9, adult: Secondary | ICD-10-CM

## 2018-07-15 DIAGNOSIS — F411 Generalized anxiety disorder: Secondary | ICD-10-CM

## 2018-07-15 DIAGNOSIS — E7800 Pure hypercholesterolemia, unspecified: Secondary | ICD-10-CM | POA: Diagnosis not present

## 2018-07-15 DIAGNOSIS — M545 Low back pain, unspecified: Secondary | ICD-10-CM

## 2018-07-15 DIAGNOSIS — J410 Simple chronic bronchitis: Secondary | ICD-10-CM

## 2018-07-15 DIAGNOSIS — E1142 Type 2 diabetes mellitus with diabetic polyneuropathy: Secondary | ICD-10-CM

## 2018-07-15 DIAGNOSIS — F1110 Opioid abuse, uncomplicated: Secondary | ICD-10-CM

## 2018-07-15 DIAGNOSIS — I10 Essential (primary) hypertension: Secondary | ICD-10-CM | POA: Diagnosis not present

## 2018-07-15 DIAGNOSIS — G8929 Other chronic pain: Secondary | ICD-10-CM

## 2018-07-15 DIAGNOSIS — Z720 Tobacco use: Secondary | ICD-10-CM

## 2018-07-15 DIAGNOSIS — K219 Gastro-esophageal reflux disease without esophagitis: Secondary | ICD-10-CM

## 2018-07-15 DIAGNOSIS — E66812 Obesity, class 2: Secondary | ICD-10-CM

## 2018-07-15 MED ORDER — ZOLPIDEM TARTRATE 10 MG PO TABS: 10.0000 mg | ORAL_TABLET | Freq: Every day | ORAL | 2 refills | Status: DC

## 2018-07-15 MED ORDER — BUSPIRONE HCL 7.5 MG PO TABS: 7.5000 mg | ORAL_TABLET | Freq: Every day | ORAL | 2 refills | Status: DC

## 2018-07-15 NOTE — Progress Notes (Signed)
Virtual Visit via telephone Note  I connected with Madison Gentry on 07/15/18 at 12:00 PM by telephone and verified that I am speaking with the correct person using two identifiers. Madison Gentry is currently located at home and her grandchildren is currently with her during visit. The provider, Mary-Margaret Hassell Done, FNP is located in their office at time of visit.  I discussed the limitations, risks, security and privacy concerns of performing an evaluation and management service by telephone and the availability of in person appointments. I also discussed with the patient that there may be a patient responsible charge related to this service. The patient expressed understanding and agreed to proceed.   History and Present Illness:   Chief Complaint: Medical Management of Chronic Issues    HPI:  1. Essential hypertension No c/o chest pain, sob or headache. Does not check blood pressure at home. BP Readings from Last 3 Encounters:  04/22/18 133/78  01/09/18 129/80  05/10/17 133/82    2. Pure hypercholesterolemia Does not watch diet and does no exercise.  3. Recurrent major depressive disorder, in partial remission ([place]) She is on prozac.,still feels slightly depressed but no more then usual. Depression screen [facility] 2/9 07/15/2018 04/22/2018 01/09/2018  Decreased Interest '1 1 1  '$ Down, Depressed, Hopeless '1 1 3  '$ PHQ - 2 Score '2 2 4  '$ Altered sleeping '1 1 3  '$ Tired, decreased energy '3 1 3  '$ Change in appetite '1 1 3  '$ Feeling bad or failure about yourself  0 0 1  Trouble concentrating 0 1 1  Moving slowly or fidgety/restless 0 0 0  Suicidal thoughts 0 0 0  PHQ-9 Score '7 6 15  '$ Difficult doing work/chores Somewhat difficult - Somewhat difficult  Some recent data might be hidden     4. GAD (generalized anxiety disorder) She is a constant worries and she take s buspar for thst. She says it really helps keep her calm.  5. Primary insomnia Sleeps well when she takes her ambien  6.  Gastroesophageal reflux disease without esophagitis Takes omeprazole daily for stomach. Works well to keep symptoms under control.  7. Simple chronic bronchitis (HCC) has used albuterol a couple of times in the last several days  8. Type 2 diabetes mellitus with diabetic polyneuropathy, without long-term current use of insulin (HCC) Last HGBA1c was 6.7. she does not check blood sugars unless she feels bad. She has not checked them in several months.  9. Tobacco user Smokes over a pack a day.  10. Chronic midline low back pain without sciatica She goes to pain management for pain meds  11. Narcotic abuse ([place]) Again goes to pain management monthly  12. Class 2 severe obesity due to excess calories with serious comorbidity and body mass index (BMI) of 38.0 to 38.9 in adult [facility]) No recent weight changes    Outpatient Encounter Medications as of 07/15/2018  Medication Sig  . amLODipine (NORVASC) 10 MG tablet Take 1 tablet (10 mg total) by mouth daily.  . Aspirin-Acetaminophen-Caffeine (GOODYS EXTRA STRENGTH) 520-260-32.5 MG PACK Take 1 packet by mouth 2 (two) times daily as needed. For pain or headache   . blood glucose meter kit and supplies Dispense based on patient and insurance preference. Use up to four times daily as directed. (FOR ICD-10 E10.9, E11.9).  . busPIRone (BUSPAR) 7.5 MG tablet Take 1 tablet by mouth once daily  . cyclobenzaprine (FLEXERIL) 10 MG tablet Take 1 tablet (10 mg total) by mouth 3 (three) times daily.  Marland Kitchen  diclofenac (VOLTAREN) 75 MG EC tablet Take 1 tablet (75 mg total) by mouth 2 (two) times daily.  Marland Kitchen ezetimibe (ZETIA) 10 MG tablet Take 1 tablet (10 mg total) by mouth daily. TAKE 1 TABLET BY MOUTH ONCE DAILY  . FLUoxetine (PROZAC) 40 MG capsule Take 1 capsule (40 mg total) by mouth 2 (two) times daily.  . hydrochlorothiazide (HYDRODIURIL) 25 MG tablet Take 1 tablet (25 mg total) by mouth daily.  Marland Kitchen ibuprofen (ADVIL,MOTRIN) 200 MG tablet Take 200 mg by mouth  every 6 (six) hours as needed. Takes 800 mg when needed for menstrual cramping  . lisinopril (PRINIVIL,ZESTRIL) 10 MG tablet Take 1 tablet (10 mg total) by mouth daily.  . metFORMIN (GLUCOPHAGE) 1000 MG tablet Take 1 tablet (1,000 mg total) by mouth 2 (two) times daily with a meal.  . nicotine (NICODERM CQ - DOSED IN MG/24 HOURS) 21 mg/24hr patch APPLY ONE PATCH TOPICALLY ONCE DAILY  . nitroGLYCERIN (NITROSTAT) 0.4 MG SL tablet DISSOLVE ONE TABLET UNDER THE TONGUE EVERY 5 MINUTES AS NEEDED FOR CHEST PAIN.  DO NOT EXCEED A TOTAL OF 3 DOSES IN 15 MINUTES  . omeprazole (PRILOSEC) 40 MG capsule Take 1 capsule (40 mg total) by mouth daily.  . simvastatin (ZOCOR) 40 MG tablet Take 1 tablet (40 mg total) by mouth daily at 6 PM.  . zolpidem (AMBIEN) 10 MG tablet Take 1 tablet (10 mg total) by mouth at bedtime.     New complaints: None today  Social history: Her boyfriend and her daughter and 2 grandchildren live with her.     Review of Systems  Constitutional: Negative for diaphoresis and weight loss.  Eyes: Negative for blurred vision, double vision and pain.  Respiratory: Negative for shortness of breath.   Cardiovascular: Negative for chest pain, palpitations, orthopnea and leg swelling.  Gastrointestinal: Positive for constipation. Negative for abdominal pain.  Skin: Negative for rash.  Neurological: Negative for dizziness, sensory change, loss of consciousness, weakness and headaches.  Endo/Heme/Allergies: Negative for polydipsia. Does not bruise/bleed easily.  Psychiatric/Behavioral: Negative for memory loss. The patient does not have insomnia.   All other systems reviewed and are negative.    Observations/Objective: Alert and oriented- answers all questions appropriately No distress  Assessment and Plan: Madison Gentry comes in today with chief complaint of Medical Management of Chronic Issues   Diagnosis and orders addressed:  1. Essential hypertension Low sodium diet  2.  Pure hypercholesterolemia Low fat diet  3. Recurrent major depressive disorder, in partial remission [facility]) Stress management' continue prozac as rx  4. GAD (generalized anxiety disorder) Stress management  5. Primary insomnia Bedtime routine  6. Gastroesophageal reflux disease without esophagitis Avoid spicy foods Do not eat 2 hours prior to bedtime  7. Simple chronic bronchitis (HCC) Smoking cessation encouraged  8. Type 2 diabetes mellitus with diabetic polyneuropathy, without long-term current use of insulin (HCC) Strict carb continue Really need to check blood sugar fasting every morning  9. Tobacco user Smokn=ing cessation encouraged  10. Chronic midline low back pain without sciatica Keep follow up with pain management  11. Narcotic abuse ([place])  12. Class 2 severe obesity due to excess calories with serious comorbidity and body mass index (BMI) of 38.0 to 38.9 in adult [facility]) Discussed diet and exercise for person with BMI >25 Will recheck weight in 3-6 months    previous lab results reviewed Health Maintenance reviewed Diet and exercise encouraged    Follow Up Instructions: 3 months    I discussed  the assessment and treatment plan with the patient. The patient was provided an opportunity to ask questions and all were answered. The patient agreed with the plan and demonstrated an understanding of the instructions.   The patient was advised to call back or seek an in-person evaluation if the symptoms worsen or if the condition fails to improve as anticipated.  The above assessment and management plan was discussed with the patient. The patient verbalized understanding of and has agreed to the management plan. Patient is aware to call the clinic if symptoms persist or worsen. Patient is aware when to return to the clinic for a follow-up visit. Patient educated on when it is appropriate to go to the emergency department.   Time call ended:  12:20 PM  I  provided 20 minutes of non-face-to-face time during this encounter.    Mary-Margaret Hassell Done, FNP

## 2018-10-15 ENCOUNTER — Telehealth: Payer: Self-pay | Admitting: Nurse Practitioner

## 2018-10-17 MED ORDER — INCONTINENCE SUPPLIES KIT: PACK | 0 refills | Status: DC

## 2018-10-17 NOTE — Telephone Encounter (Signed)
Rx Sent to [facility]

## 2018-10-17 NOTE — Telephone Encounter (Signed)
Ok to refill supplies 

## 2018-10-23 ENCOUNTER — Ambulatory Visit (INDEPENDENT_AMBULATORY_CARE_PROVIDER_SITE_OTHER): Payer: Medicaid Other | Admitting: Nurse Practitioner

## 2018-10-23 ENCOUNTER — Encounter: Payer: Self-pay | Admitting: Nurse Practitioner

## 2018-10-23 DIAGNOSIS — E7800 Pure hypercholesterolemia, unspecified: Secondary | ICD-10-CM | POA: Diagnosis not present

## 2018-10-23 DIAGNOSIS — F411 Generalized anxiety disorder: Secondary | ICD-10-CM

## 2018-10-23 DIAGNOSIS — I25119 Atherosclerotic heart disease of native coronary artery with unspecified angina pectoris: Secondary | ICD-10-CM | POA: Diagnosis not present

## 2018-10-23 DIAGNOSIS — J410 Simple chronic bronchitis: Secondary | ICD-10-CM | POA: Diagnosis not present

## 2018-10-23 DIAGNOSIS — F5101 Primary insomnia: Secondary | ICD-10-CM

## 2018-10-23 DIAGNOSIS — E66812 Obesity, class 2: Secondary | ICD-10-CM

## 2018-10-23 DIAGNOSIS — Z720 Tobacco use: Secondary | ICD-10-CM

## 2018-10-23 DIAGNOSIS — Z6838 Body mass index (BMI) 38.0-38.9, adult: Secondary | ICD-10-CM

## 2018-10-23 DIAGNOSIS — K219 Gastro-esophageal reflux disease without esophagitis: Secondary | ICD-10-CM

## 2018-10-23 DIAGNOSIS — E785 Hyperlipidemia, unspecified: Secondary | ICD-10-CM

## 2018-10-23 DIAGNOSIS — F3341 Major depressive disorder, recurrent, in partial remission: Secondary | ICD-10-CM

## 2018-10-23 DIAGNOSIS — K047 Periapical abscess without sinus: Secondary | ICD-10-CM

## 2018-10-23 DIAGNOSIS — I10 Essential (primary) hypertension: Secondary | ICD-10-CM | POA: Diagnosis not present

## 2018-10-23 DIAGNOSIS — E1142 Type 2 diabetes mellitus with diabetic polyneuropathy: Secondary | ICD-10-CM

## 2018-10-23 MED ORDER — FLUCONAZOLE 150 MG PO TABS: 150.0000 mg | ORAL_TABLET | Freq: Once | ORAL | 0 refills | Status: AC

## 2018-10-23 MED ORDER — FLUOXETINE HCL 40 MG PO CAPS: 40.0000 mg | ORAL_CAPSULE | Freq: Two times a day (BID) | ORAL | 1 refills | Status: DC

## 2018-10-23 MED ORDER — HYDROCHLOROTHIAZIDE 25 MG PO TABS: 25.0000 mg | ORAL_TABLET | Freq: Every day | ORAL | 1 refills | Status: DC

## 2018-10-23 MED ORDER — AMLODIPINE BESYLATE 10 MG PO TABS: 10.0000 mg | ORAL_TABLET | Freq: Every day | ORAL | 1 refills | Status: DC

## 2018-10-23 MED ORDER — LISINOPRIL 10 MG PO TABS: 10.0000 mg | ORAL_TABLET | Freq: Every day | ORAL | 3 refills | Status: DC

## 2018-10-23 MED ORDER — OMEPRAZOLE 40 MG PO CPDR: 40.0000 mg | DELAYED_RELEASE_CAPSULE | Freq: Every day | ORAL | 1 refills | Status: DC

## 2018-10-23 MED ORDER — ZOLPIDEM TARTRATE 10 MG PO TABS: 10.0000 mg | ORAL_TABLET | Freq: Every day | ORAL | 2 refills | Status: DC

## 2018-10-23 MED ORDER — BUSPIRONE HCL 7.5 MG PO TABS: 7.5000 mg | ORAL_TABLET | Freq: Every day | ORAL | 2 refills | Status: DC

## 2018-10-23 MED ORDER — NICOTINE 21 MG/24HR TD PT24: MEDICATED_PATCH | TRANSDERMAL | 2 refills | Status: DC

## 2018-10-23 MED ORDER — EZETIMIBE 10 MG PO TABS: 10.0000 mg | ORAL_TABLET | Freq: Every day | ORAL | 1 refills | Status: DC

## 2018-10-23 MED ORDER — CLINDAMYCIN HCL 300 MG PO CAPS: 300.0000 mg | ORAL_CAPSULE | Freq: Four times a day (QID) | ORAL | 0 refills | Status: DC

## 2018-10-23 MED ORDER — SIMVASTATIN 40 MG PO TABS: 40.0000 mg | ORAL_TABLET | Freq: Every day | ORAL | 1 refills | Status: DC

## 2018-10-23 NOTE — Progress Notes (Signed)
Virtual Visit via telephone Note Due to COVID-19 pandemic this visit was conducted virtually. This visit type was conducted due to national recommendations for restrictions regarding the COVID-19 Pandemic (e.g. social distancing, sheltering in place) in an effort to limit this patient's exposure and mitigate transmission in our community. All issues noted in this document were discussed and addressed.  A physical exam was not performed with this format.  I connected with Madison Gentry on 10/23/18 at 11:25 by telephone and verified that I am speaking with the correct person using two identifiers. Madison Gentry is currently located at home and her grandchildres are currently with her during visit. The provider, Mary-Margaret Hassell Done, FNP is located in their office at time of visit.  I discussed the limitations, risks, security and privacy concerns of performing an evaluation and management service by telephone and the availability of in person appointments. I also discussed with the patient that there may be a patient responsible charge related to this service. The patient expressed understanding and agreed to proceed.   History and Present Illness:   Chief Complaint: Medical Management of Chronic Issues    HPI:  1. Essential hypertension No c/o chest pain, sob or headache. Does not check blood pressure at home. BP Readings from Last 3 Encounters:  04/22/18 133/78  01/09/18 129/80  05/10/17 133/82     2. Pure hypercholesterolemia Does not watch diet and does no exercise. Lab Results  Component Value Date   CHOL 135 04/22/2018   HDL 40 04/22/2018   LDLCALC 50 04/22/2018   TRIG 224 (H) 04/22/2018   CHOLHDL 3.4 04/22/2018     3. Atherosclerosis of native coronary artery of native heart with angina pectoris [facility]) Has not seen cardiology. This was seen on routine chest x ray. Not symptomatic at this time.  4. Simple chronic bronchitis (HCC) Does not use any inhalers. denies any SOB or  chronic cough.  5. Type 2 diabetes mellitus with diabetic polyneuropathy, without long-term current use of insulin (HCC) Does not check blood sugars very often.  Lab Results  Component Value Date   HGBA1C 6.7 04/22/2018     6. Gastroesophageal reflux disease without esophagitis Is on omeprazole daily and is doing well.  7. Recurrent major depressive disorder, in partial remission ([place]) Has been on prozac for some time now. Says she is doing well without side effects. Depression screen [facility] 2/9 10/23/2018 07/15/2018 04/22/2018  Decreased Interest 0 1 1  Down, Depressed, Hopeless '1 1 1  '$ PHQ - 2 Score '1 2 2  '$ Altered sleeping - 1 1  Tired, decreased energy - 3 1  Change in appetite - 1 1  Feeling bad or failure about yourself  - 0 0  Trouble concentrating - 0 1  Moving slowly or fidgety/restless - 0 0  Suicidal thoughts - 0 0  PHQ-9 Score - 7 6  Difficult doing work/chores - Somewhat difficult -  Some recent data might be hidden     8. GAD (generalized anxiety disorder) Is on buspar daily for anxiety. Works well for her.   9. Primary insomnia Is not able to sleep without her ambien  10. Tobacco user Smokes over a pack a day  11. Class 2 severe obesity due to excess calories with serious comorbidity and body mass index (BMI) of 38.0 to 38.9 in adult [facility]) No recent weight changes that she is aware of.    Outpatient Encounter Medications as of 10/23/2018  Medication Sig  . amLODipine (NORVASC) 10  MG tablet Take 1 tablet (10 mg total) by mouth daily.  . Aspirin-Acetaminophen-Caffeine (GOODYS EXTRA STRENGTH) 520-260-32.5 MG PACK Take 1 packet by mouth 2 (two) times daily as needed. For pain or headache   . blood glucose meter kit and supplies Dispense based on patient and insurance preference. Use up to four times daily as directed. (FOR ICD-10 E10.9, E11.9).  . busPIRone (BUSPAR) 7.5 MG tablet Take 1 tablet (7.5 mg total) by mouth daily.  . cyclobenzaprine (FLEXERIL) 10 MG  tablet Take 1 tablet (10 mg total) by mouth 3 (three) times daily.  . diclofenac (VOLTAREN) 75 MG EC tablet Take 1 tablet (75 mg total) by mouth 2 (two) times daily.  Marland Kitchen ezetimibe (ZETIA) 10 MG tablet Take 1 tablet (10 mg total) by mouth daily. TAKE 1 TABLET BY MOUTH ONCE DAILY  . FLUoxetine (PROZAC) 40 MG capsule Take 1 capsule (40 mg total) by mouth 2 (two) times daily.  . hydrochlorothiazide (HYDRODIURIL) 25 MG tablet Take 1 tablet (25 mg total) by mouth daily.  Marland Kitchen ibuprofen (ADVIL,MOTRIN) 200 MG tablet Take 200 mg by mouth every 6 (six) hours as needed. Takes 800 mg when needed for menstrual cramping  . Incontinence Supplies KIT Patient needs Pull ups, chuck pads and wipes.  Marland Kitchen lisinopril (PRINIVIL,ZESTRIL) 10 MG tablet Take 1 tablet (10 mg total) by mouth daily.  . metFORMIN (GLUCOPHAGE) 1000 MG tablet Take 1 tablet (1,000 mg total) by mouth 2 (two) times daily with a meal.  . nicotine (NICODERM CQ - DOSED IN MG/24 HOURS) 21 mg/24hr patch APPLY ONE PATCH TOPICALLY ONCE DAILY  . nitroGLYCERIN (NITROSTAT) 0.4 MG SL tablet DISSOLVE ONE TABLET UNDER THE TONGUE EVERY 5 MINUTES AS NEEDED FOR CHEST PAIN.  DO NOT EXCEED A TOTAL OF 3 DOSES IN 15 MINUTES  . omeprazole (PRILOSEC) 40 MG capsule Take 1 capsule (40 mg total) by mouth daily.  . simvastatin (ZOCOR) 40 MG tablet Take 1 tablet (40 mg total) by mouth daily at 6 PM.  . zolpidem (AMBIEN) 10 MG tablet Take 1 tablet (10 mg total) by mouth at bedtime.    Past Surgical History:  Procedure Laterality Date  . TUBAL LIGATION      Family History  Problem Relation Age of Onset  . CAD Father 75       Died age 9  . CAD Brother 30  . CAD Mother 3    New complaints: Patient is c/o of bad tooth. Gum is sore and tooth is throbbing.  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    Review of Systems  Constitutional: Negative for  diaphoresis and weight loss.  Eyes: Negative for blurred vision, double vision and pain.  Respiratory: Negative for shortness of breath.   Cardiovascular: Negative for chest pain, palpitations, orthopnea and leg swelling.  Gastrointestinal: Negative for abdominal pain.  Skin: Negative for rash.  Neurological: Negative for dizziness, sensory change, loss of consciousness, weakness and headaches.  Endo/Heme/Allergies: Negative for polydipsia. Does not bruise/bleed easily.  Psychiatric/Behavioral: Negative for memory loss. The patient does not have insomnia.   All other systems reviewed and are negative.    Observations/Objective: Alert and oriented- answers all questions appropriately Mild  Distress due to abscess tooth    Assessment and Plan: Madison Gentry comes in today with chief complaint of Medical Management of Chronic Issues   Diagnosis and orders addressed:  1. Essential hypertension  Low sodium diet - hydrochlorothiazide (HYDRODIURIL) 25 MG tablet; Take 1 tablet (25 mg total) by mouth daily.  Dispense: 90 tablet; Refill: 1 - amLODipine (NORVASC) 10 MG tablet; Take 1 tablet (10 mg total) by mouth daily.  Dispense: 90 tablet; Refill: 1 - lisinopril (ZESTRIL) 10 MG tablet; Take 1 tablet (10 mg total) by mouth daily.  Dispense: 90 tablet; Refill: 3  2. Pure hypercholesterolemia Low fat diet - simvastatin (ZOCOR) 40 MG tablet; Take 1 tablet (40 mg total) by mouth daily at 6 PM.  Dispense: 90 tablet; Refill: 1 - ezetimibe (ZETIA) 10 MG tablet; Take 1 tablet (10 mg total) by mouth daily. TAKE 1 TABLET BY MOUTH ONCE DAILY  Dispense: 90 tablet; Refill: 1  3. Atherosclerosis of native coronary artery of native heart with angina pectoris ([place]) encouragedd to make appointmnet with cardiology  4. Simple chronic bronchitis (HCC) Smoking cessation  5. Type 2 diabetes mellitus with diabetic polyneuropathy, without long-term current use of insulin (HCC) Watch carbs in diet  6.  Gastroesophageal reflux disease without esophagitis Avoid spicy foods Do not eat 2 hours prior to bedtime - omeprazole (PRILOSEC) 40 MG capsule; Take 1 capsule (40 mg total) by mouth daily.  Dispense: 90 capsule; Refill: 1  7. Recurrent major depressive disorder, in partial remission (HCC) Stress management - FLUoxetine (PROZAC) 40 MG capsule; Take 1 capsule (40 mg total) by mouth 2 (two) times daily.  Dispense: 180 capsule; Refill: 1  8. GAD (generalized anxiety disorder) - busPIRone (BUSPAR) 7.5 MG tablet; Take 1 tablet (7.5 mg total) by mouth daily.  Dispense: 90 tablet; Refill: 2  9. Primary insomnia Bedtime routine - zolpidem (AMBIEN) 10 MG tablet; Take 1 tablet (10 mg total) by mouth at bedtime.  Dispense: 30 tablet; Refill: 2  10. Tobacco user Smoking cessation encourgaed - nicotine (NICODERM CQ - DOSED IN MG/24 HOURS) 21 mg/24hr patch; APPLY ONE PATCH TOPICALLY ONCE DAILY  Dispense: 28 patch; Refill: 2  11. Class 2 severe obesity due to excess calories with serious comorbidity and body mass index (BMI) of 38.0 to 38.9 in adult [facility]) Discussed diet and exercise for person with BMI >25 Will recheck weight in 3-6 months   13. Abscessed tooth Need to see dentist ASAP - clindamycin (CLEOCIN) 300 MG capsule; Take 1 capsule (300 mg total) by mouth 4 (four) times daily.  Dispense: 40 capsule; Refill: 0 - fluconazole (DIFLUCAN) 150 MG tablet; Take 1 tablet (150 mg total) by mouth once for 1 dose.  Dispense: 1 tablet; Refill: 0   Labs pending Health Maintenance reviewed Diet and exercise encouraged  Follow up plan: 3 months      I discussed the assessment and treatment plan with the patient. The patient was provided an opportunity to ask questions and all were answered. The patient agreed with the plan and demonstrated an understanding of the instructions.   The patient was advised to call back or seek an in-person evaluation if the symptoms worsen or if the condition fails  to improve as anticipated.  The above assessment and management plan was discussed with the patient. The patient verbalized understanding of and has agreed to the management plan. Patient is aware to call the clinic if symptoms persist or worsen. Patient is aware when to return to the clinic for a follow-up visit. Patient educated on when it is appropriate to go to the emergency department.   Time call ended:  11:38  I provided 18 minutes of non-face-to-face time during this encounter.  Mary-Margaret Michole Lecuyer, FNP   

## 2018-11-19 ENCOUNTER — Other Ambulatory Visit: Payer: Self-pay

## 2018-11-19 ENCOUNTER — Encounter: Payer: Self-pay | Admitting: Family

## 2018-11-19 ENCOUNTER — Ambulatory Visit (INDEPENDENT_AMBULATORY_CARE_PROVIDER_SITE_OTHER): Payer: Medicaid Other

## 2018-11-19 ENCOUNTER — Ambulatory Visit: Payer: Medicaid Other | Admitting: Family

## 2018-11-19 VITALS — BP 143/85 | HR 90 | Temp 98.4°F | Resp 16 | Ht 62.0 in | Wt 213.8 lb

## 2018-11-19 DIAGNOSIS — L089 Local infection of the skin and subcutaneous tissue, unspecified: Secondary | ICD-10-CM | POA: Diagnosis not present

## 2018-11-19 DIAGNOSIS — S90851A Superficial foreign body, right foot, initial encounter: Secondary | ICD-10-CM

## 2018-11-19 MED ORDER — CEPHALEXIN 500 MG PO CAPS: 500.0000 mg | ORAL_CAPSULE | Freq: Three times a day (TID) | ORAL | 0 refills | Status: DC

## 2018-11-19 NOTE — Progress Notes (Addendum)
   Subjective:    Patient ID: Katieann Hungate, female    DOB: 23-Jun-1965, 53 y.o.   MRN: [id number]  Chief Complaint  Patient presents with  . left foot wound with pain    HPI Pt presents to the office today with left tenderness of right foot. She states she stepped on a broken ash tray about two weeks ago and she removed two pieces of glass and thought it was out. She reports she noticed tenderness of 10 out 10 yesterday with yellow discharge.    Review of Systems  All other systems reviewed and are negative.      Objective:   Physical Exam Vitals signs reviewed.  Constitutional:      General: She is not in acute distress.    Appearance: She is well-developed.  HENT:     Head: Normocephalic and atraumatic.  Eyes:     Pupils: Pupils are equal, round, and reactive to light.  Neck:     Musculoskeletal: Normal range of motion and neck supple.     Thyroid: No thyromegaly.  Cardiovascular:     Rate and Rhythm: Normal rate and regular rhythm.     Heart sounds: Normal heart sounds. No murmur.  Pulmonary:     Effort: Pulmonary effort is normal. No respiratory distress.     Breath sounds: Normal breath sounds. No wheezing.  Abdominal:     General: Bowel sounds are normal. There is no distension.     Palpations: Abdomen is soft.     Tenderness: There is no abdominal tenderness.  Musculoskeletal: Normal range of motion.        General: Tenderness present.       Feet:  Feet:     Comments: Tenderness on right Skin:    General: Skin is warm and dry.  Neurological:     Mental Status: She is alert and oriented to person, place, and time.     Cranial Nerves: No cranial nerve deficit.     Deep Tendon Reflexes: Reflexes are normal and symmetric.  Psychiatric:        Behavior: Behavior normal.        Thought Content: Thought content normal.        Judgment: Judgment normal.     Area cleaned, small piece of glass removed, area continues to be tender  X-ray- No foreign body noted   BP (!) 143/85   Pulse 90   Temp 98.4 F (36.9 C) (Temporal)   Resp 16   Ht 5\' 2"  (1.575 m)   Wt 213 lb 12.8 oz (97 kg)   SpO2 94%   BMI 39.10 kg/m      Assessment & Plan:  Kaydan Wilhoite comes in today with chief complaint of left foot wound with pain   Diagnosis and orders addressed:  1. Foreign body in right foot, initial encounter - DG Foot Complete Right; Future - cephALEXin (KEFLEX) 500 MG capsule; Take 1 capsule (500 mg total) by mouth 3 (three) times daily.  Dispense: 21 capsule; Refill: 0  2. Foreign body in right foot with infection, initial encounter - DG Foot Complete Right; Future - cephALEXin (KEFLEX) 500 MG capsule; Take 1 capsule (500 mg total) by mouth 3 (three) times daily.  Dispense: 21 capsule; Refill: 0   Keep area clean and dry Start Keflex  X-ray pending to make sure glass was removed. I did not see anything.   Evelina Dun, FNP

## 2018-11-19 NOTE — Patient Instructions (Signed)
Puncture Wound A puncture wound is an injury that is caused by a sharp, thin object that goes through (penetrates) your skin. Usually, a puncture wound does not leave a large opening in your skin, so it may not bleed a lot. However, when you get a puncture wound, dirt or other materials (foreign bodies) can be forced into your wound and can break off inside. This increases the chance of infection, such as tetanus. There are many sharp, pointed objects that can cause puncture wounds, including teeth, nails, splinters of glass, fishhooks, and needles. Treatment may include washing out the wound with a germ-free (sterile) salt-water solution, having the wound opened surgically to remove a foreign object, closing the wound with stitches (sutures), and covering the wound with antibiotic ointment and a bandage (dressing). Depending on what caused the injury, you may also need a tetanus shot or a rabies shot. Follow these instructions at home: Medicines  Take or apply over-the-counter and prescription medicines only as told by your health care provider.  If you were prescribed an antibiotic medicine, take or apply it as told by your health care provider. Do not stop using the antibiotic even if your condition improves. Bathing  Keep the dressing dry as told by your health care provider.  Do not take baths, swim, or use a hot tub until your health care provider approves. Ask your health care provider if you may take showers. You may only be allowed to take sponge baths. Wound care   There are many ways to close and cover a wound. For example, a wound can be closed with sutures, skin glue, or adhesive strips. Follow instructions from your health care provider about how to take care of your wound. Make sure you: ? Wash your hands with soap and water before and after you change your dressing. If soap and water are not available, use hand sanitizer. ? Change your dressing as told by your health care provider.  ? Leave sutures, skin glue, or adhesive strips in place. These skin closures may need to stay in place for 2 weeks or longer. If adhesive strip edges start to loosen and curl up, you may trim the loose edges. Do not remove adhesive strips completely unless your health care provider tells you to do that.  Clean the wound as told by your health care provider.  Do not scratch or pick at the wound.  Check your wound every day for signs of infection. Check for: ? Redness, swelling, or pain. ? Fluid or blood. ? Warmth. ? Pus or a bad smell. General instructions  Raise (elevate) the injured area above the level of your heart while you are sitting or lying down.  If your puncture wound is in your foot, ask your health care provider if you need to avoid putting weight on your foot and for how long. Do not use the injured limb to support your body weight until your health care provider says that you can. Use crutches as told by your health care provider.  Keep all follow-up visits as told by your health care provider. This is important. Contact a health care provider if:  You received a tetanus shot and you have swelling, severe pain, redness, or bleeding at the injection site.  You have a fever.  Your sutures come out.  You notice a bad smell coming from your wound or your dressing.  You notice something coming out of your wound, such as wood or glass.  Your pain is   not controlled with medicine.  You have increased redness, swelling, or pain at the site of your wound.  You have fluid, blood, or pus coming from your wound.  You notice a change in the color of your skin near your wound.  You need to change the dressing frequently due to fluid, blood, or pus draining from your wound.  You develop a new rash.  You develop numbness around your wound.  You have warmth around your wound. Get help right away if:  You develop severe swelling around your wound.  Your pain suddenly  increases and is severe.  You develop painful skin lumps.  You have a red streak going away from your wound.  The wound is on your hand or foot and you: ? Cannot properly move a finger or toe. ? Notice that your fingers or toes look pale or bluish. Summary  A puncture wound is an injury that is caused by a sharp, thin object that goes through (penetrates) your skin.  Treatment may include washing out the wound, having the wound opened surgically to remove a foreign object, closing the wound with stitches (sutures), and covering the wound with antibiotic ointment and a bandage (dressing).  Follow instructions from your health care provider about how to take care of your wound.  Contact a health care provider if you have increased redness, swelling, or pain at the site of your wound.  Keep all follow-up visits as told by your health care provider. This is important. This information is not intended to replace advice given to you by your health care provider. Make sure you discuss any questions you have with your health care provider. Document Released: 11/22/2004 Document Revised: 09/19/2017 Document Reviewed: 09/19/2017 Elsevier Patient Education  2020 [employer]  

## 2018-11-20 ENCOUNTER — Telehealth: Payer: Self-pay | Admitting: Family

## 2018-11-20 NOTE — Telephone Encounter (Signed)
Patient states she will make appointment.

## 2018-11-20 NOTE — Telephone Encounter (Signed)
What symptoms do you have? Seen Christy 9-23 for glass in her foot and it is worse today and can't walk on it. Still swollen and she is a diabetic and worried about her foot. About fell this morning when she got out of the bed. Wants to know what to do   How long have you been sick? 2-3 weeks ago when the glass was in the foot.   Have you been seen for this problem? Yes  If your provider decides to give you a prescription, which pharmacy would you like for it to be sent to? [facility]   Patient informed that this information will be sent to the clinical staff for review and that they should receive a follow up call.

## 2018-11-20 NOTE — Telephone Encounter (Signed)
PT needs to be seen today.

## 2018-11-23 ENCOUNTER — Emergency Department (HOSPITAL_COMMUNITY)
Admission: EM | Admit: 2018-11-23 | Discharge: 2018-11-23 | Disposition: A | Discharge status: Home / Self Care | Payer: Medicaid Other | Attending: Emergency Medicine | Admitting: Emergency Medicine

## 2018-11-23 ENCOUNTER — Other Ambulatory Visit: Payer: Self-pay

## 2018-11-23 ENCOUNTER — Encounter (HOSPITAL_COMMUNITY): Payer: Self-pay | Admitting: Emergency Medicine

## 2018-11-23 DIAGNOSIS — S91309A Unspecified open wound, unspecified foot, initial encounter: Secondary | ICD-10-CM

## 2018-11-23 DIAGNOSIS — S91341D Puncture wound with foreign body, right foot, subsequent encounter: Secondary | ICD-10-CM | POA: Diagnosis present

## 2018-11-23 DIAGNOSIS — S91331D Puncture wound without foreign body, right foot, subsequent encounter: Secondary | ICD-10-CM | POA: Diagnosis not present

## 2018-11-23 DIAGNOSIS — E119 Type 2 diabetes mellitus without complications: Secondary | ICD-10-CM | POA: Diagnosis not present

## 2018-11-23 DIAGNOSIS — Z79899 Other long term (current) drug therapy: Secondary | ICD-10-CM | POA: Insufficient documentation

## 2018-11-23 DIAGNOSIS — W25XXXD Contact with sharp glass, subsequent encounter: Secondary | ICD-10-CM | POA: Diagnosis not present

## 2018-11-23 DIAGNOSIS — I10 Essential (primary) hypertension: Secondary | ICD-10-CM | POA: Diagnosis not present

## 2018-11-23 DIAGNOSIS — S90851D Superficial foreign body, right foot, subsequent encounter: Secondary | ICD-10-CM

## 2018-11-23 DIAGNOSIS — Z5329 Procedure and treatment not carried out because of patient's decision for other reasons: Secondary | ICD-10-CM | POA: Insufficient documentation

## 2018-11-23 DIAGNOSIS — J449 Chronic obstructive pulmonary disease, unspecified: Secondary | ICD-10-CM | POA: Insufficient documentation

## 2018-11-23 DIAGNOSIS — F17210 Nicotine dependence, cigarettes, uncomplicated: Secondary | ICD-10-CM | POA: Diagnosis not present

## 2018-11-23 DIAGNOSIS — Z7984 Long term (current) use of oral hypoglycemic drugs: Secondary | ICD-10-CM | POA: Diagnosis not present

## 2018-11-23 NOTE — ED Provider Notes (Signed)
[facility] EMERGENCY DEPARTMENT Provider Note   CSN: [id number] Arrival date & time: 11/23/18  1507     History   Chief Complaint Chief Complaint  Patient presents with  . Foot Pain    HPI Madison Gentry is a 53 y.o. female with history of diabetes here for evaluation of wound check.  Patient states she stepped on a broken ashtray a couple weeks ago.  She removed some pieces of glass herself and initially thought the wound was healing well.  Reports a few days ago she had worsening pain in her left foot with associated yellow discharge.  She went to her PCP who cleaned the area and removed a piece of the glass in the office.  They discharged her with Keflex and an outpatient x-ray.  She was called yesterday and notified that there was a foreign body in the soft tissues of her foot so she is here for wound check.  Reports over the last 24 hours her pain is actually significantly improved and now she is able to put some weight on it.  Reports mild erythema but not any significantly worse over the last few days.  She thinks she may have removed a foreign body herself over the last few hours.  Has been using peroxide to clean the wound and antibiotic ointment.  No associated drainage, fevers or bleeding.  States she was not given a tetanus shot at her PCP appointment.     HPI  Past Medical History:  Diagnosis Date  . CAD (coronary artery disease)   . COPD (chronic obstructive pulmonary disease) ([place])   . Diabetes ([place])   . Hypertension   . Stroke [facility])     Patient Active Problem List   Diagnosis Date Noted  . Primary insomnia 02/01/2017  . Gastroesophageal reflux disease without esophagitis 02/01/2017  . GAD (generalized anxiety disorder) 02/01/2017  . Narcotic abuse ([place]) 01/13/2015  . COPD (chronic obstructive pulmonary disease) ([place]) 06/15/2013  . Back pain 06/15/2013  . Urinary incontinence 06/15/2013  . Hypertension 11/07/2012  . Tobacco user 09/22/2012  . DM (diabetes  mellitus) ([place]) 09/22/2012  . HLD (hyperlipidemia) 09/22/2012  . Obesity 02/11/2011  . Depression 02/11/2011  . Coronary atherosclerosis 01/18/2009    Past Surgical History:  Procedure Laterality Date  . TUBAL LIGATION       OB History    Gravida  3   Para  3   Term  3   Preterm      AB      Living        SAB      TAB      Ectopic      Multiple      Live Births               Home Medications    Prior to Admission medications   Medication Sig Start Date End Date Taking? Authorizing Provider  amLODipine (NORVASC) 10 MG tablet Take 1 tablet (10 mg total) by mouth daily. 10/23/18   Hassell Done, Mary-Margaret, FNP  Aspirin-Acetaminophen-Caffeine (GOODYS EXTRA STRENGTH) 520-260-32.5 MG PACK Take 1 packet by mouth 2 (two) times daily as needed. For pain or headache     [provider]  blood glucose meter kit and supplies Dispense based on patient and insurance preference. Use up to four times daily as directed. (FOR ICD-10 E10.9, E11.9). 04/22/18   Hassell Done, Mary-Margaret, FNP  busPIRone (BUSPAR) 7.5 MG tablet Take 1 tablet (7.5 mg total) by mouth daily. 10/23/18  Hassell Done, Mary-Margaret, FNP  cephALEXin (KEFLEX) 500 MG capsule Take 1 capsule (500 mg total) by mouth 3 (three) times daily. 11/19/18   Sharion Balloon, FNP  clindamycin (CLEOCIN) 300 MG capsule Take 1 capsule (300 mg total) by mouth 4 (four) times daily. 10/23/18   Hassell Done Mary-Margaret, FNP  cyclobenzaprine (FLEXERIL) 10 MG tablet Take 1 tablet (10 mg total) by mouth 3 (three) times daily. 01/05/16   Lily Kocher, PA-C  diclofenac (VOLTAREN) 75 MG EC tablet Take 1 tablet (75 mg total) by mouth 2 (two) times daily. 01/05/16   Lily Kocher, PA-C  ezetimibe (ZETIA) 10 MG tablet Take 1 tablet (10 mg total) by mouth daily. TAKE 1 TABLET BY MOUTH ONCE DAILY 10/23/18   Hassell Done, Mary-Margaret, FNP  FLUoxetine (PROZAC) 40 MG capsule Take 1 capsule (40 mg total) by mouth 2 (two) times daily. 10/23/18   Hassell Done,  Mary-Margaret, FNP  hydrochlorothiazide (HYDRODIURIL) 25 MG tablet Take 1 tablet (25 mg total) by mouth daily. 10/23/18   Hassell Done, Mary-Margaret, FNP  ibuprofen (ADVIL,MOTRIN) 200 MG tablet Take 200 mg by mouth every 6 (six) hours as needed. Takes 800 mg when needed for menstrual cramping    [provider]  Incontinence Supplies KIT Patient needs Pull ups, chuck pads and wipes. 10/17/18   Hassell Done, Mary-Margaret, FNP  lisinopril (ZESTRIL) 10 MG tablet Take 1 tablet (10 mg total) by mouth daily. 10/23/18   Hassell Done Mary-Margaret, FNP  metFORMIN (GLUCOPHAGE) 1000 MG tablet Take 1 tablet (1,000 mg total) by mouth 2 (two) times daily with a meal. 04/22/18   Hassell Done, Mary-Margaret, FNP  nicotine (NICODERM CQ - DOSED IN MG/24 HOURS) 21 mg/24hr patch APPLY ONE PATCH TOPICALLY ONCE DAILY 10/23/18   Hassell Done, Mary-Margaret, FNP  nitroGLYCERIN (NITROSTAT) 0.4 MG SL tablet DISSOLVE ONE TABLET UNDER THE TONGUE EVERY 5 MINUTES AS NEEDED FOR CHEST PAIN.  DO NOT EXCEED A TOTAL OF 3 DOSES IN 15 MINUTES 02/01/17   Hassell Done, Mary-Margaret, FNP  omeprazole (PRILOSEC) 40 MG capsule Take 1 capsule (40 mg total) by mouth daily. 10/23/18   Hassell Done, Mary-Margaret, FNP  simvastatin (ZOCOR) 40 MG tablet Take 1 tablet (40 mg total) by mouth daily at 6 PM. 10/23/18   Hassell Done, Mary-Margaret, FNP  zolpidem (AMBIEN) 10 MG tablet Take 1 tablet (10 mg total) by mouth at bedtime. 10/23/18   Chevis Pretty, FNP    Family History Family History  Problem Relation Age of Onset  . CAD Father 62       Died age 58  . CAD Brother 23  . CAD Mother 28    Social History Social History   Tobacco Use  . Smoking status: Current Every Day Smoker    Packs/day: 1.50    Years: 28.00    Pack years: 42.00    Types: Cigarettes  . Smokeless tobacco: Never Used  Substance Use Topics  . Alcohol use: Yes    Comment: occas  . Drug use: No     Allergies   Patient has no known allergies.   Review of Systems Review of Systems  Skin:  Positive for wound.  All other systems reviewed and are negative.    Physical Exam Updated Vital Signs BP (!) 144/90 (BP Location: Right Arm)   Pulse 95   Temp 98 F (36.7 C) (Oral)   Resp 17   Ht _0  (1.575 m)   Wt 96.6 kg   SpO2 97%   BMI 38.96 kg/m   Physical Exam Constitutional:  Appearance: She is well-developed.  HENT:     Head: Normocephalic.     Nose: Nose normal.  Eyes:     General: Lids are normal.  Neck:     Musculoskeletal: Normal range of motion.  Cardiovascular:     Rate and Rhythm: Normal rate.  Pulmonary:     Effort: Pulmonary effort is normal. No respiratory distress.  Musculoskeletal: Normal range of motion.  Skin:    Comments: Approximate 1 cm ulcerated wound to the right sole at the base of the MTPs.  Mild circumferential erythema and tenderness.  No fluctuance, drainage, bleeding.  No streaking of erythema outwardly.  Palpation of the area reveals mild tenderness but no obvious palpated foreign body.  Area was cleansed with iodine and explored with small forceps.  The wound edges and center and surrounding skin were slightly scraped as well.  I did not feel, palpate or hear any type of foreign body during this process.  Neurological:     Mental Status: She is alert.  Psychiatric:        Behavior: Behavior normal.      ED Treatments / Results  Labs (all labs ordered are listed, but only abnormal results are displayed) Labs Reviewed - No data to display  EKG None  Radiology No results found.  Procedures Procedures (including critical care time)  Medications Ordered in ED Medications - No data to display   Initial Impression / Assessment and Plan / ED Course  I have reviewed the triage vital signs and the nursing notes.  Pertinent labs & imaging results that were available during my care of the patient were reviewed by me and considered in my medical decision making (see chart for details).  I reviewed patient's PCP  appointment.  I reviewed patient's last x-ray of the foot.  Is a very tiny hyperechoic area very superficially on x-ray.  Wound today was cleaned and explored.  No visualized or palpated foreign bodies on my exam.  Given her improvement in pain and benign exam suspect that foreign body initially seen may have actually been removed by patient over the last few days.  Given uncertainty, history of diabetes have recommended 2 view foot x-ray to reevaluate the area.  I also recommend a tetanus shot.  Patient understood indications for this but states that she would like to be discharged because she does not want to drive home in the dark.  Discussed indications for x-ray and tetanus today and risk of leaving without it, patient understood and assumes the risk of leaving without any treatment or intervention here.  States she will follow-up with her PCP.  She is on Keflex, compliant and has improvement in her pain.  No clinical signs to suggest superimposed infection, abscess.  Return precautions discussed.  Patient comfortable with this.  Final Clinical Impressions(s) / ED Diagnoses   Final diagnoses:  Wound of foot  Foreign body in right foot, subsequent encounter    ED Discharge Orders    None       Kinnie Feil, PA-C 11/23/18 1820    Fredia Sorrow, MD 11/24/18 1230

## 2018-11-23 NOTE — Discharge Instructions (Addendum)
We reviewed the x-ray done by primary care doctor.  Foreign body was noted on the surface and it was very small.  We cleansed the area but were unable to visualize a foreign body or affirmatively remove it.  I recommended an x-ray to recheck in a tetanus shot but you were concerned about getting late and declined these.  Please follow-up with your primary care doctor for reevaluation and possibly repeat x-ray.  They may defer these x-rays if your symptoms are improving.  Keep the area wound clean with clean water and soap, apply antibiotic ointment.  Return for fever greater than 100.4, worsening pain swelling redness pus fevers

## 2018-11-23 NOTE — ED Triage Notes (Signed)
Pt states Thursday she stepped on a piece of glass and went to her pcp. Was called Friday and told her xray still showed a small piece and to return or come here. Pt decided to come here. PCP started abx Thursday.

## 2018-12-22 ENCOUNTER — Ambulatory Visit (INDEPENDENT_AMBULATORY_CARE_PROVIDER_SITE_OTHER): Payer: Medicaid Other | Admitting: Family Medicine

## 2018-12-22 ENCOUNTER — Other Ambulatory Visit: Payer: Self-pay

## 2018-12-23 ENCOUNTER — Other Ambulatory Visit: Payer: Self-pay

## 2018-12-23 ENCOUNTER — Encounter: Payer: Self-pay | Admitting: Nurse Practitioner

## 2018-12-23 ENCOUNTER — Ambulatory Visit (INDEPENDENT_AMBULATORY_CARE_PROVIDER_SITE_OTHER): Payer: Medicaid Other | Admitting: Nurse Practitioner

## 2018-12-23 DIAGNOSIS — K047 Periapical abscess without sinus: Secondary | ICD-10-CM

## 2018-12-23 MED ORDER — CLINDAMYCIN HCL 300 MG PO CAPS: 300.0000 mg | ORAL_CAPSULE | Freq: Four times a day (QID) | ORAL | 0 refills | Status: DC

## 2018-12-23 NOTE — Progress Notes (Signed)
   Virtual Visit via telephone Note Due to COVID-19 pandemic this visit was conducted virtually. This visit type was conducted due to national recommendations for restrictions regarding the COVID-19 Pandemic (e.g. social distancing, sheltering in place) in an effort to limit this patient's exposure and mitigate transmission in our community. All issues noted in this document were discussed and addressed.  A physical exam was not performed with this format.  I connected with Madison Gentry on 12/23/18 at 4:55 by telephone and verified that I am speaking with the correct person using two identifiers. Madison Gentry is currently located at home and her husband is currently with her during visit. The provider, Mary-Margaret Hassell Done, FNP is located in their office at time of visit.  I discussed the limitations, risks, security and privacy concerns of performing an evaluation and management service by telephone and the availability of in person appointments. I also discussed with the patient that there may be a patient responsible charge related to this service. The patient expressed understanding and agreed to proceed.   History and Present Illness:   Chief Complaint: Dental Pain   HPI Patient calls n today again with tooth abscess. She has been seen on 2 previous occasions and has been on antibiotics x2.     Review of Systems  Constitutional: Negative for diaphoresis and weight loss.  HENT:       Right upper dental abscess- sounds like first molar  Eyes: Negative for blurred vision, double vision and pain.  Respiratory: Negative for shortness of breath.   Cardiovascular: Negative for chest pain, palpitations, orthopnea and leg swelling.  Gastrointestinal: Negative for abdominal pain.  Skin: Negative for rash.  Neurological: Negative for dizziness, sensory change, loss of consciousness, weakness and headaches.  Endo/Heme/Allergies: Negative for polydipsia. Does not bruise/bleed easily.   Psychiatric/Behavioral: Negative for memory loss. The patient does not have insomnia.   All other systems reviewed and are negative.    Observations/Objective: Alert and oriented- answers all questions appropriately Moderate  distress    Assessment and Plan: [name] in today with chief complaint of Dental Pain   1. Abscessed tooth Gargle with salt water motirn for pain NEEDS TO SEE A DENTIST ASAP - clindamycin (CLEOCIN) 300 MG capsule; Take 1 capsule (300 mg total) by mouth 4 (four) times daily.  Dispense: 40 capsule; Refill: 0   Follow Up Instructions: prn    I discussed the assessment and treatment plan with the patient. The patient was provided an opportunity to ask questions and all were answered. The patient agreed with the plan and demonstrated an understanding of the instructions.   The patient was advised to call back or seek an in-person evaluation if the symptoms worsen or if the condition fails to improve as anticipated.  The above assessment and management plan was discussed with the patient. The patient verbalized understanding of and has agreed to the management plan. Patient is aware to call the clinic if symptoms persist or worsen. Patient is aware when to return to the clinic for a follow-up visit. Patient educated on when it is appropriate to go to the emergency department.   Time call ended:  5:07 I provided 12 minutes of non-face-to-face time during this encounter.    Mary-Margaret Hassell Done, FNP

## 2018-12-27 NOTE — Progress Notes (Signed)
Unable to reach pt by phone with four attempts. WS

## 2019-02-02 ENCOUNTER — Other Ambulatory Visit: Payer: Self-pay | Admitting: Nurse Practitioner

## 2019-02-02 DIAGNOSIS — F5101 Primary insomnia: Secondary | ICD-10-CM

## 2019-02-04 ENCOUNTER — Other Ambulatory Visit: Payer: Self-pay | Admitting: Nurse Practitioner

## 2019-02-04 NOTE — Telephone Encounter (Signed)
Advised pt ntbs for refills on this medication and scheduled appt with MMM tomorrow at 10.

## 2019-02-05 ENCOUNTER — Ambulatory Visit: Payer: Medicaid Other | Admitting: Nurse Practitioner

## 2019-02-05 ENCOUNTER — Encounter: Payer: Self-pay | Admitting: Nurse Practitioner

## 2019-02-05 ENCOUNTER — Other Ambulatory Visit: Payer: Self-pay

## 2019-02-05 VITALS — BP 165/102 | HR 91 | Temp 97.5°F | Resp 20 | Ht 62.0 in | Wt 212.0 lb

## 2019-02-05 DIAGNOSIS — F411 Generalized anxiety disorder: Secondary | ICD-10-CM

## 2019-02-05 DIAGNOSIS — E66812 Obesity, class 2: Secondary | ICD-10-CM

## 2019-02-05 DIAGNOSIS — F5101 Primary insomnia: Secondary | ICD-10-CM

## 2019-02-05 DIAGNOSIS — F1110 Opioid abuse, uncomplicated: Secondary | ICD-10-CM

## 2019-02-05 DIAGNOSIS — Z6838 Body mass index (BMI) 38.0-38.9, adult: Secondary | ICD-10-CM

## 2019-02-05 DIAGNOSIS — I10 Essential (primary) hypertension: Secondary | ICD-10-CM

## 2019-02-05 DIAGNOSIS — J410 Simple chronic bronchitis: Secondary | ICD-10-CM

## 2019-02-05 DIAGNOSIS — Z720 Tobacco use: Secondary | ICD-10-CM

## 2019-02-05 DIAGNOSIS — E7800 Pure hypercholesterolemia, unspecified: Secondary | ICD-10-CM | POA: Diagnosis not present

## 2019-02-05 DIAGNOSIS — E1142 Type 2 diabetes mellitus with diabetic polyneuropathy: Secondary | ICD-10-CM | POA: Diagnosis not present

## 2019-02-05 DIAGNOSIS — F3341 Major depressive disorder, recurrent, in partial remission: Secondary | ICD-10-CM

## 2019-02-05 DIAGNOSIS — K219 Gastro-esophageal reflux disease without esophagitis: Secondary | ICD-10-CM

## 2019-02-05 LAB — BAYER DCA HB A1C WAIVED: HB A1C (BAYER DCA - WAIVED): 7.9 % — ABNORMAL HIGH (ref <7.0)

## 2019-02-05 MED ORDER — SIMVASTATIN 40 MG PO TABS: 40.0000 mg | ORAL_TABLET | Freq: Every day | ORAL | 1 refills | Status: DC

## 2019-02-05 MED ORDER — EZETIMIBE 10 MG PO TABS: 10.0000 mg | ORAL_TABLET | Freq: Every day | ORAL | 1 refills | Status: DC

## 2019-02-05 MED ORDER — ZOLPIDEM TARTRATE 10 MG PO TABS: 10.0000 mg | ORAL_TABLET | Freq: Every day | ORAL | 2 refills | Status: DC

## 2019-02-05 MED ORDER — LISINOPRIL 10 MG PO TABS: 10.0000 mg | ORAL_TABLET | Freq: Every day | ORAL | 3 refills | Status: DC

## 2019-02-05 MED ORDER — HYDROCHLOROTHIAZIDE 25 MG PO TABS: 25.0000 mg | ORAL_TABLET | Freq: Every day | ORAL | 1 refills | Status: DC

## 2019-02-05 MED ORDER — BUSPIRONE HCL 7.5 MG PO TABS: 7.5000 mg | ORAL_TABLET | Freq: Every day | ORAL | 2 refills | Status: DC

## 2019-02-05 MED ORDER — AMLODIPINE BESYLATE 10 MG PO TABS: 10.0000 mg | ORAL_TABLET | Freq: Every day | ORAL | 1 refills | Status: DC

## 2019-02-05 MED ORDER — FLUOXETINE HCL 40 MG PO CAPS: 40.0000 mg | ORAL_CAPSULE | Freq: Two times a day (BID) | ORAL | 1 refills | Status: DC

## 2019-02-05 MED ORDER — OMEPRAZOLE 40 MG PO CPDR: 40.0000 mg | DELAYED_RELEASE_CAPSULE | Freq: Every day | ORAL | 1 refills | Status: DC

## 2019-02-05 NOTE — Progress Notes (Signed)
Subjective:    Patient ID: Madison Gentry, female    DOB: 05/07/1965, 53 y.o.   MRN: [id number]   Chief Complaint: Medical Management of Chronic Issues    HPI:  1. Essential hypertension No c/o chest pain, sob or headache. Does not check blood pressure at home. BP Readings from Last 3 Encounters:  02/05/19 (!) 165/102  11/23/18 (!) 144/90  11/19/18 (!) 143/85     2. Pure hypercholesterolemia Does not watch diet and does no exercise. Lab Results  Component Value Date   CHOL 135 04/22/2018   HDL 40 04/22/2018   LDLCALC 50 04/22/2018   TRIG 224 (H) 04/22/2018   CHOLHDL 3.4 04/22/2018     3. Type 2 diabetes mellitus with diabetic polyneuropathy, without long-term current use of insulin (HCC) She has not been checking her blood sugars at home. She says she des watch carbs as best she can. Patient has only been taking 1/2 tablet of metformin instead of a whole tablet BID Lab Results  Component Value Date   HGBA1C 6.7 04/22/2018     4. Primary insomnia She take ambien nighty to sleep. Says she feels rested in the mornings.  5. GAD (generalized anxiety disorder) Is o n buspar and is doing wel. GAD 7 : Generalized Anxiety Score 02/05/2019 09/23/2015  Nervous, Anxious, on Edge 3 2  Control/stop worrying 1 2  Worry too much - different things 1 1  Trouble relaxing 3 3  Restless 0 0  Easily annoyed or irritable 3 3  Afraid - awful might happen 0 2  Total GAD 7 Score 11 13  Anxiety Difficulty Not difficult at all Somewhat difficult      6. Narcotic abuse ([place]) [facility] pain managment  7. Gastroesophageal reflux disease without esophagitis Is on omeprazole daily and is doing well, no symptoms as long as she takes hr meds.  8. Simple chronic bronchitis ([place]) Has a daily cough. Is on no inhalers  9. Recurrent major depressive disorder, in partial remission ([place]) Has been on prozac for sevrla years. Says it is still wrking well for her.  Depression screen [facility] 2/9 02/05/2019 11/19/2018 10/23/2018  Decreased Interest 0 0 0  Down, Depressed, Hopeless 0 0 1  PHQ - 2 Score 0 0 1  Altered sleeping - 0 -  Tired, decreased energy - 0 -  Change in appetite - 0 -  Feeling bad or failure about yourself  - 0 -  Trouble concentrating - 0 -  Moving slowly or fidgety/restless - 0 -  Suicidal thoughts - 0 -  PHQ-9 Score - 0 -  Difficult doing work/chores - Not difficult at all -  Some recent data might be hidden     10. Class 2 severe obesity due to excess calories with serious comorbidity and body mass index (BMI) of 38.0 to 38.9 in adult [facility]) No recent weight changes Wt Readings from Last 3 Encounters:  02/05/19 212 lb (96.2 kg)  11/23/18 213 lb (96.6 kg)  11/19/18 213 lb 12.8 oz (97 kg)   BMI Readings from Last 3 Encounters:  02/05/19 38.78 kg/m  11/23/18 38.96 kg/m  11/19/18 39.10 kg/m     11. Tobacco user Smokes about 1 pack a day.     Outpatient Encounter Medications as of 02/05/2019  Medication Sig  . amLODipine (NORVASC) 10 MG tablet Take 1 tablet (10 mg total) by mouth daily.  . Aspirin-Acetaminophen-Caffeine (GOODYS EXTRA STRENGTH) 520-260-32.5 MG PACK Take  1 packet by mouth 2 (two) times daily as needed. For pain or headache   . blood glucose meter kit and supplies Dispense based on patient and insurance preference. Use up to four times daily as directed. (FOR ICD-10 E10.9, E11.9).  . busPIRone (BUSPAR) 7.5 MG tablet Take 1 tablet (7.5 mg total) by mouth daily.  . cyclobenzaprine (FLEXERIL) 10 MG tablet Take 1 tablet (10 mg total) by mouth 3 (three) times daily.  . diclofenac (VOLTAREN) 75 MG EC tablet Take 1 tablet (75 mg total) by mouth 2 (two) times daily.  Marland Kitchen ezetimibe (ZETIA) 10 MG tablet Take 1 tablet (10 mg total) by mouth daily. TAKE 1 TABLET BY MOUTH ONCE DAILY  . FLUoxetine (PROZAC) 40 MG capsule Take 1 capsule (40 mg total) by mouth 2 (two) times daily.  . hydrochlorothiazide (HYDRODIURIL) 25 MG  tablet Take 1 tablet (25 mg total) by mouth daily.  Marland Kitchen ibuprofen (ADVIL,MOTRIN) 200 MG tablet Take 200 mg by mouth every 6 (six) hours as needed. Takes 800 mg when needed for menstrual cramping  . Incontinence Supplies KIT Patient needs Pull ups, chuck pads and wipes.  Marland Kitchen lisinopril (ZESTRIL) 10 MG tablet Take 1 tablet (10 mg total) by mouth daily.  . metFORMIN (GLUCOPHAGE) 1000 MG tablet Take 1 tablet (1,000 mg total) by mouth 2 (two) times daily with a meal.  . nitroGLYCERIN (NITROSTAT) 0.4 MG SL tablet DISSOLVE ONE TABLET UNDER THE TONGUE EVERY 5 MINUTES AS NEEDED FOR CHEST PAIN.  DO NOT EXCEED A TOTAL OF 3 DOSES IN 15 MINUTES  . omeprazole (PRILOSEC) 40 MG capsule Take 1 capsule (40 mg total) by mouth daily.  . simvastatin (ZOCOR) 40 MG tablet Take 1 tablet (40 mg total) by mouth daily at 6 PM.  . zolpidem (AMBIEN) 10 MG tablet Take 1 tablet (10 mg total) by mouth at bedtime.  . nicotine (NICODERM CQ - DOSED IN MG/24 HOURS) 21 mg/24hr patch APPLY ONE PATCH TOPICALLY ONCE DAILY (Patient not taking: Reported on 02/05/2019)     Past Surgical History:  Procedure Laterality Date  . TUBAL LIGATION      Family History  Problem Relation Age of Onset  . CAD Father 69       Died age 74  . CAD Brother 17  . CAD Mother 43    New complaints: None today  Social history: Live with husbands and take car of her grandchildren daily  Controlled substance contract: 02/05/19    Review of Systems  Constitutional: Negative for diaphoresis.  Eyes: Negative for pain.  Respiratory: Negative for shortness of breath.   Cardiovascular: Negative for chest pain, palpitations and leg swelling.  Gastrointestinal: Negative for abdominal pain.  Endocrine: Negative for polydipsia.  Skin: Negative for rash.  Neurological: Negative for dizziness, weakness and headaches.  Hematological: Does not bruise/bleed easily.  All other systems reviewed and are negative.      Objective:   Physical Exam Vitals  and nursing note reviewed.  Constitutional:      General: She is not in acute distress.    Appearance: Normal appearance. She is well-developed.  HENT:     Head: Normocephalic.     Nose: Nose normal.  Eyes:     Pupils: Pupils are equal, round, and reactive to light.  Neck:     Vascular: No carotid bruit or JVD.  Cardiovascular:     Rate and Rhythm: Normal rate and regular rhythm.     Heart sounds: Normal heart sounds.  Pulmonary:  Effort: Pulmonary effort is normal. No respiratory distress.     Breath sounds: Normal breath sounds. No wheezing or rales.  Chest:     Chest wall: No tenderness.  Abdominal:     General: Bowel sounds are normal. There is no distension or abdominal bruit.     Palpations: Abdomen is soft. There is no hepatomegaly, splenomegaly, mass or pulsatile mass.     Tenderness: There is no abdominal tenderness.  Musculoskeletal:        General: Normal range of motion.     Cervical back: Normal range of motion and neck supple.  Lymphadenopathy:     Cervical: No cervical adenopathy.  Skin:    General: Skin is warm and dry.  Neurological:     Mental Status: She is alert and oriented to person, place, and time.     Deep Tendon Reflexes: Reflexes are normal and symmetric.  Psychiatric:        Behavior: Behavior normal.        Thought Content: Thought content normal.        Judgment: Judgment normal.     BP (!) 165/102   Pulse 91   Temp (!) 97.5 F (36.4 C) (Temporal)   Resp 20   Ht 5' 2" (1.575 m)   Wt 212 lb (96.2 kg)   SpO2 98%   BMI 38.78 kg/m       Assessment & Plan:  Madison Gentry comes in today with chief complaint of Medical Management of Chronic Issues   Diagnosis and orders addressed:  1. Essential hypertension Low sodium diet - CMP14+EGFR - hydrochlorothiazide (HYDRODIURIL) 25 MG tablet; Take 1 tablet (25 mg total) by mouth daily.  Dispense: 90 tablet; Refill: 1 - lisinopril (ZESTRIL) 10 MG tablet; Take 1 tablet (10 mg total) by mouth  daily.  Dispense: 90 tablet; Refill: 3 - amLODipine (NORVASC) 10 MG tablet; Take 1 tablet (10 mg total) by mouth daily.  Dispense: 90 tablet; Refill: 1  2. Pure hypercholesterolemia Low fat diet - Lipid panel - simvastatin (ZOCOR) 40 MG tablet; Take 1 tablet (40 mg total) by mouth daily at 6 PM.  Dispense: 90 tablet; Refill: 1 - ezetimibe (ZETIA) 10 MG tablet; Take 1 tablet (10 mg total) by mouth daily. TAKE 1 TABLET BY MOUTH ONCE DAILY  Dispense: 90 tablet; Refill: 1  3. Type 2 diabetes mellitus with diabetic polyneuropathy, without long-term current use of insulin (HCC) Stricter carb counting Take metformin 1068m BID- do nt break in half anymore Really need to keep diary of bloodsugars - hgba1c  4. Primary insomnia Bedtime routine - zolpidem (AMBIEN) 10 MG tablet; Take 1 tablet (10 mg total) by mouth at bedtime.  Dispense: 30 tablet; Refill: 2  5. GAD (generalized anxiety disorder) Stress management - busPIRone (BUSPAR) 7.5 MG tablet; Take 1 tablet (7.5 mg total) by mouth daily.  Dispense: 90 tablet; Refill: 2  6. [place]otic [place] ([place] Continue with pain clinic  7. Gastroesophageal reflux disease without esophagitis Avoid spicy foods Do not eat 2 hours prior to bedtime  - omeprazole (PRILOSEC) 40 MG capsule; Take 1 capsule (40 mg total) by mouth daily.  Dispense: 90 capsule; Refill: 1  8. [place] Again smoking cessation encuraged  9. Recurrent major depressive disorder, in partial remission (HCC) - FLUoxetine (PROZAC) 40 MG capsule; Take 1 capsule (40 mg total) by mouth 2 (two) times daily.  Dispense: 180 capsule; Refill: 1  10. Class 2 severe obesity due to excess calories with serious  comorbidity and body mass index (BMI) of 38.0 to 38.9 in adult [facility]) Discussed diet and exercise for person with BMI >25 Will recheck weight in 3-6 months  11. Tobacco user Smoking cessation encouraged   Labs pending Health Maintenance reviewed Diet and  exercise encouraged  Follow up plan: 3 months   Mary-Margaret Hassell Done, FNP

## 2019-02-05 NOTE — Patient Instructions (Signed)
Carbohydrate Counting for Diabetes Mellitus, Adult  Carbohydrate counting is a method of keeping track of how many carbohydrates you eat. Eating carbohydrates naturally increases the amount of sugar (glucose) in the blood. Counting how many carbohydrates you eat helps keep your blood glucose within normal limits, which helps you manage your diabetes (diabetes mellitus). It is important to know how many carbohydrates you can safely have in each meal. This is different for every person. A diet and nutrition specialist (registered dietitian) can help you make a meal plan and calculate how many carbohydrates you should have at each meal and snack. Carbohydrates are found in the following foods:  Grains, such as breads and cereals.  Dried beans and soy products.  Starchy vegetables, such as potatoes, peas, and corn.  Fruit and fruit juices.  Milk and yogurt.  Sweets and snack foods, such as cake, cookies, candy, chips, and soft drinks. How do I count carbohydrates? There are two ways to count carbohydrates in food. You can use either of the methods or a combination of both. Reading "Nutrition Facts" on packaged food The "Nutrition Facts" list is included on the labels of almost all packaged foods and beverages in the [place] It includes:  The serving size.  Information about nutrients in each serving, including the grams (g) of carbohydrate per serving. To use the "Nutrition Facts":  Decide how many servings you will have.  Multiply the number of servings by the number of carbohydrates per serving.  The resulting number is the total amount of carbohydrates that you will be having. Learning standard serving sizes of other foods When you eat carbohydrate foods that are not packaged or do not include "Nutrition Facts" on the label, you need to measure the servings in order to count the amount of carbohydrates:  Measure the foods that you will eat with a food scale or measuring cup, if needed.   Decide how many standard-size servings you will eat.  Multiply the number of servings by 15. Most carbohydrate-rich foods have about 15 g of carbohydrates per serving. ? For example, if you eat 8 oz (170 g) of strawberries, you will have eaten 2 servings and 30 g of carbohydrates (2 servings x 15 g = 30 g).  For foods that have more than one food mixed, such as soups and casseroles, you must count the carbohydrates in each food that is included. The following list contains standard serving sizes of common carbohydrate-rich foods. Each of these servings has about 15 g of carbohydrates:   hamburger bun or  English muffin.   oz (15 mL) syrup.   oz (14 g) jelly.  1 slice of bread.  1 six-inch tortilla.  3 oz (85 g) cooked rice or pasta.  4 oz (113 g) cooked dried beans.  4 oz (113 g) starchy vegetable, such as peas, corn, or potatoes.  4 oz (113 g) hot cereal.  4 oz (113 g) mashed potatoes or  of a large baked potato.  4 oz (113 g) canned or frozen fruit.  4 oz (120 mL) fruit juice.  4-6 crackers.  6 chicken nuggets.  6 oz (170 g) unsweetened dry cereal.  6 oz (170 g) plain fat-free yogurt or yogurt sweetened with artificial sweeteners.  8 oz (240 mL) milk.  8 oz (170 g) fresh fruit or one small piece of fruit.  24 oz (680 g) popped popcorn. Example of carbohydrate counting Sample meal  3 oz (85 g) chicken breast.  6 oz (170 g)   brown rice.  4 oz (113 g) corn.  8 oz (240 mL) milk.  8 oz (170 g) strawberries with sugar-free whipped topping. Carbohydrate calculation 1. Identify the foods that contain carbohydrates: ? Rice. ? Corn. ? Milk. ? Strawberries. 2. Calculate how many servings you have of each food: ? 2 servings rice. ? 1 serving corn. ? 1 serving milk. ? 1 serving strawberries. 3. Multiply each number of servings by 15 g: ? 2 servings rice x 15 g = 30 g. ? 1 serving corn x 15 g = 15 g. ? 1 serving milk x 15 g = 15 g. ? 1 serving  strawberries x 15 g = 15 g. 4. Add together all of the amounts to find the total grams of carbohydrates eaten: ? 30 g + 15 g + 15 g + 15 g = 75 g of carbohydrates total. Summary  Carbohydrate counting is a method of keeping track of how many carbohydrates you eat.  Eating carbohydrates naturally increases the amount of sugar (glucose) in the blood.  Counting how many carbohydrates you eat helps keep your blood glucose within normal limits, which helps you manage your diabetes.  A diet and nutrition specialist (registered dietitian) can help you make a meal plan and calculate how many carbohydrates you should have at each meal and snack. This information is not intended to replace advice given to you by your health care provider. Make sure you discuss any questions you have with your health care provider. Document Released: 02/12/2005 Document Revised: 09/06/2016 Document Reviewed: 07/27/2015 Elsevier Patient Education  2020 Elsevier Inc.  

## 2019-02-06 LAB — CMP14+EGFR
ALT: 40 IU/L — ABNORMAL HIGH (ref 0–32)
AST: 23 IU/L (ref 0–40)
Albumin/Globulin Ratio: 2 (ref 1.2–2.2)
Albumin: 4.5 g/dL (ref 3.8–4.9)
Alkaline Phosphatase: 85 IU/L (ref 39–117)
BUN/Creatinine Ratio: 19 (ref 9–23)
BUN: 9 mg/dL (ref 6–24)
Bilirubin Total: <0.2 mg/dL (ref 0.0–1.2)
CO2: 25 mmol/L (ref 20–29)
Calcium: 9.8 mg/dL (ref 8.7–10.2)
Chloride: 98 mmol/L (ref 96–106)
Creatinine, Ser: 0.48 mg/dL — ABNORMAL LOW (ref 0.57–1.00)
GFR calc Af Amer: 131 mL/min/{1.73_m2} (ref >59)
GFR calc non Af Amer: 114 mL/min/{1.73_m2} (ref >59)
Globulin, Total: 2.3 g/dL (ref 1.5–4.5)
Glucose: 199 mg/dL — ABNORMAL HIGH (ref 65–99)
Potassium: 4 mmol/L (ref 3.5–5.2)
Sodium: 141 mmol/L (ref 134–144)
Total Protein: 6.8 g/dL (ref 6.0–8.5)

## 2019-02-06 LAB — LIPID PANEL
Chol/HDL Ratio: 2.9 ratio (ref 0.0–4.4)
Cholesterol, Total: 135 mg/dL (ref 100–199)
HDL: 47 mg/dL (ref >39)
LDL Chol Calc (NIH): 69 mg/dL (ref 0–99)
Triglycerides: 105 mg/dL (ref 0–149)
VLDL Cholesterol Cal: 19 mg/dL (ref 5–40)

## 2019-02-10 ENCOUNTER — Telehealth: Payer: Self-pay | Admitting: Nurse Practitioner

## 2019-02-10 NOTE — Telephone Encounter (Signed)
Drop back o 1/2 tablet daily- strict low carb diet or will end up on shots

## 2019-02-10 NOTE — Telephone Encounter (Signed)
Patient aware and verbalized understanding. °

## 2019-04-27 ENCOUNTER — Other Ambulatory Visit: Payer: Self-pay | Admitting: Nurse Practitioner

## 2019-04-27 DIAGNOSIS — E1142 Type 2 diabetes mellitus with diabetic polyneuropathy: Secondary | ICD-10-CM

## 2019-05-04 ENCOUNTER — Ambulatory Visit (INDEPENDENT_AMBULATORY_CARE_PROVIDER_SITE_OTHER): Payer: Medicaid Other | Admitting: Nurse Practitioner

## 2019-05-04 ENCOUNTER — Encounter: Payer: Self-pay | Admitting: Nurse Practitioner

## 2019-05-04 ENCOUNTER — Other Ambulatory Visit: Payer: Self-pay | Admitting: Nurse Practitioner

## 2019-05-04 DIAGNOSIS — R059 Cough, unspecified: Secondary | ICD-10-CM

## 2019-05-04 DIAGNOSIS — R05 Cough: Secondary | ICD-10-CM | POA: Diagnosis not present

## 2019-05-04 DIAGNOSIS — F5101 Primary insomnia: Secondary | ICD-10-CM

## 2019-05-04 MED ORDER — BENZONATATE 100 MG PO CAPS: 100.0000 mg | ORAL_CAPSULE | Freq: Three times a day (TID) | ORAL | 0 refills | Status: DC | PRN

## 2019-05-04 NOTE — Progress Notes (Signed)
   Virtual Visit via telephone Note Due to COVID-19 pandemic this visit was conducted virtually. This visit type was conducted due to national recommendations for restrictions regarding the COVID-19 Pandemic (e.g. social distancing, sheltering in place) in an effort to limit this patient's exposure and mitigate transmission in our community. All issues noted in this document were discussed and addressed.  A physical exam was not performed with this format.  I connected with [name] on 05/04/19 at 3:50 by telephone and verified that I am speaking with the correct person using two identifiers. Carri Spillers is currently located at home and no one is currently with her during visit. The provider, Mary-Margaret Daphine Deutscher, FNP is located in their office at time of visit.  I discussed the limitations, risks, security and privacy concerns of performing an evaluation and management service by telephone and the availability of in person appointments. I also discussed with the patient that there may be a patient responsible charge related to this service. The patient expressed understanding and agreed to proceed.   History and Present Illness:   Chief Complaint: Cough   HPI Patient calls in today c/o cough. She says that she has a cough that is worse when she lays down at night. She has been using dayquil and nyquil at night. Wanted to know if she could get cough meds with codiene.  Review of Systems  Constitutional: Negative.  Negative for chills and fever.  HENT: Negative.  Negative for congestion, ear discharge and sinus pain.   Respiratory: Positive for cough (dry cough).   Cardiovascular: Negative.   Neurological: Negative for dizziness and headaches.  Psychiatric/Behavioral: Negative.   All other systems reviewed and are negative.    Observations/Objective: Alert and oriented- answers all questions appropriately No distress No cough during visit    Assessment and Plan: Babetta Paterson in  today with chief complaint of Cough   1. [place] humidifier Rest Force fluids No codeine medication due  To being on pain meds  Meds ordered this encounter  Medications  . benzonatate (TESSALON PERLES) 100 MG capsule    Sig: Take 1 capsule (100 mg total) by mouth 3 (three) times daily as needed.    Dispense:  20 capsule    Refill:  0    Order Specific Question:   Supervising Provider    Answer:   Arville Care A [[id number]]       Follow Up Instructions: prn    I discussed the assessment and treatment plan with the patient. The patient was provided an opportunity to ask questions and all were answered. The patient agreed with the plan and demonstrated an understanding of the instructions.   The patient was advised to call back or seek an in-person evaluation if the symptoms worsen or if the condition fails to improve as anticipated.  The above assessment and management plan was discussed with the patient. The patient verbalized understanding of and has agreed to the management plan. Patient is aware to call the clinic if symptoms persist or worsen. Patient is aware when to return to the clinic for a follow-up visit. Patient educated on when it is appropriate to go to the emergency department.   Time call ended:  4:05  I provided 16 minutes of non-face-to-face time during this encounter.    Mary-Margaret Daphine Deutscher, FNP

## 2019-05-07 ENCOUNTER — Ambulatory Visit: Payer: Self-pay | Admitting: Nurse Practitioner

## 2019-05-26 ENCOUNTER — Other Ambulatory Visit: Payer: Self-pay | Admitting: Nurse Practitioner

## 2019-05-26 DIAGNOSIS — K219 Gastro-esophageal reflux disease without esophagitis: Secondary | ICD-10-CM

## 2019-06-04 ENCOUNTER — Other Ambulatory Visit: Payer: Self-pay | Admitting: Nurse Practitioner

## 2019-06-04 DIAGNOSIS — F5101 Primary insomnia: Secondary | ICD-10-CM

## 2019-06-05 ENCOUNTER — Other Ambulatory Visit: Payer: Self-pay | Admitting: Nurse Practitioner

## 2019-06-05 DIAGNOSIS — F5101 Primary insomnia: Secondary | ICD-10-CM

## 2019-06-05 NOTE — Telephone Encounter (Signed)
Last office visit 05/04/2019 Last refill 05/05/2019, #30 no refills

## 2019-06-09 ENCOUNTER — Encounter: Payer: Self-pay | Admitting: Nurse Practitioner

## 2019-06-09 ENCOUNTER — Encounter: Payer: Medicaid Other | Admitting: Nurse Practitioner

## 2019-06-09 MED ORDER — BENZONATATE 100 MG PO CAPS: 100.0000 mg | ORAL_CAPSULE | Freq: Three times a day (TID) | ORAL | 0 refills | Status: DC | PRN

## 2019-06-09 NOTE — Progress Notes (Signed)
   Virtual Visit via telephone Note Due to COVID-19 pandemic this visit was conducted virtually. This visit type was conducted due to national recommendations for restrictions regarding the COVID-19 Pandemic (e.g. social distancing, sheltering in place) in an effort to limit this patient's exposure and mitigate transmission in our community. All issues noted in this document were discussed and addressed.  A physical exam was not performed with this format.  I connected with [name] on 06/09/19 at 10:23 by telephone and verified that I am speaking with the correct person using two identifiers. Karron Goens is currently located at home  and her grandkids are  currently with her during visit. The provider, Mary-Margaret Daphine Deutscher, FNP is located in their office at time of visit.  I discussed the limitations, risks, security and privacy concerns of performing an evaluation and management service by telephone and the availability of in person appointments. I also discussed with the patient that there may be a patient responsible charge related to this service. The patient expressed understanding and agreed to proceed.   History and Present Illness:   Chief Complaint: Insomnia   HPI:  patient said already had ambien refilled and does not need appointment today. Needs in person appointment for follow up of chronic meds.   Will make a follow up face to face appointment

## 2019-06-22 ENCOUNTER — Encounter: Payer: Self-pay | Admitting: Nurse Practitioner

## 2019-06-22 ENCOUNTER — Ambulatory Visit (INDEPENDENT_AMBULATORY_CARE_PROVIDER_SITE_OTHER): Payer: Medicaid Other | Admitting: Nurse Practitioner

## 2019-06-22 ENCOUNTER — Other Ambulatory Visit: Payer: Self-pay

## 2019-06-22 VITALS — BP 126/77 | HR 83 | Temp 98.2°F | Ht 62.0 in | Wt 211.4 lb

## 2019-06-22 DIAGNOSIS — I25119 Atherosclerotic heart disease of native coronary artery with unspecified angina pectoris: Secondary | ICD-10-CM

## 2019-06-22 DIAGNOSIS — F1110 Opioid abuse, uncomplicated: Secondary | ICD-10-CM

## 2019-06-22 DIAGNOSIS — E7800 Pure hypercholesterolemia, unspecified: Secondary | ICD-10-CM | POA: Diagnosis not present

## 2019-06-22 DIAGNOSIS — F411 Generalized anxiety disorder: Secondary | ICD-10-CM | POA: Diagnosis not present

## 2019-06-22 DIAGNOSIS — J410 Simple chronic bronchitis: Secondary | ICD-10-CM

## 2019-06-22 DIAGNOSIS — F3341 Major depressive disorder, recurrent, in partial remission: Secondary | ICD-10-CM

## 2019-06-22 DIAGNOSIS — N393 Stress incontinence (female) (male): Secondary | ICD-10-CM

## 2019-06-22 DIAGNOSIS — I10 Essential (primary) hypertension: Secondary | ICD-10-CM

## 2019-06-22 DIAGNOSIS — K219 Gastro-esophageal reflux disease without esophagitis: Secondary | ICD-10-CM

## 2019-06-22 DIAGNOSIS — E1142 Type 2 diabetes mellitus with diabetic polyneuropathy: Secondary | ICD-10-CM

## 2019-06-22 DIAGNOSIS — F5101 Primary insomnia: Secondary | ICD-10-CM

## 2019-06-22 LAB — BAYER DCA HB A1C WAIVED: HB A1C (BAYER DCA - WAIVED): 7.4 % — ABNORMAL HIGH (ref <7.0)

## 2019-06-22 MED ORDER — BUDESONIDE-FORMOTEROL FUMARATE 80-4.5 MCG/ACT IN AERO: 2.0000 | INHALATION_SPRAY | Freq: Two times a day (BID) | RESPIRATORY_TRACT | 3 refills | Status: DC

## 2019-06-22 MED ORDER — AMLODIPINE BESYLATE 10 MG PO TABS: 10.0000 mg | ORAL_TABLET | Freq: Every day | ORAL | 1 refills | Status: DC

## 2019-06-22 MED ORDER — LISINOPRIL 10 MG PO TABS: 10.0000 mg | ORAL_TABLET | Freq: Every day | ORAL | 1 refills | Status: DC

## 2019-06-22 MED ORDER — METFORMIN HCL 500 MG PO TABS: 500.0000 mg | ORAL_TABLET | Freq: Two times a day (BID) | ORAL | 3 refills | Status: DC

## 2019-06-22 MED ORDER — ZOLPIDEM TARTRATE 10 MG PO TABS: 10.0000 mg | ORAL_TABLET | Freq: Every day | ORAL | 2 refills | Status: DC

## 2019-06-22 MED ORDER — HYDROCHLOROTHIAZIDE 25 MG PO TABS: 25.0000 mg | ORAL_TABLET | Freq: Every day | ORAL | 1 refills | Status: DC

## 2019-06-22 MED ORDER — OMEPRAZOLE 40 MG PO CPDR: 40.0000 mg | DELAYED_RELEASE_CAPSULE | Freq: Every day | ORAL | 1 refills | Status: DC

## 2019-06-22 MED ORDER — SIMVASTATIN 40 MG PO TABS: 40.0000 mg | ORAL_TABLET | Freq: Every day | ORAL | 1 refills | Status: DC

## 2019-06-22 MED ORDER — BUSPIRONE HCL 7.5 MG PO TABS: 7.5000 mg | ORAL_TABLET | Freq: Every day | ORAL | 2 refills | Status: DC

## 2019-06-22 MED ORDER — EZETIMIBE 10 MG PO TABS: 10.0000 mg | ORAL_TABLET | Freq: Every day | ORAL | 1 refills | Status: DC

## 2019-06-22 MED ORDER — INCONTINENCE SUPPLIES KIT: PACK | 0 refills | Status: AC

## 2019-06-22 MED ORDER — FLUOXETINE HCL 40 MG PO CAPS: 40.0000 mg | ORAL_CAPSULE | Freq: Two times a day (BID) | ORAL | 1 refills | Status: DC

## 2019-06-22 NOTE — Progress Notes (Signed)
Subjective:    Patient ID: Madison Gentry, female    DOB: [birth year], 54 y.o.   MRN: [id number]   Chief Complaint: Medical Management of Chronic Issues    HPI:  1. Essential hypertension No c/o chest pain, sob or headache. Does not check blood pressure at home. BP Readings from Last 3 Encounters:  02/05/19 (!) 165/102  11/23/18 (!) 144/90  11/19/18 (!) 143/85     2. Pure hypercholesterolemia Doe snot watch diet and does very little exercise. Lab Results  Component Value Date   CHOL 135 02/05/2019   HDL 47 02/05/2019   LDLCALC 69 02/05/2019   TRIG 105 02/05/2019   CHOLHDL 2.9 02/05/2019     3. Type 2 diabetes mellitus with diabetic polyneuropathy, without long-term current use of insulin (HCC) She has not been checking her blood sugars for some time. She has only been taking metformin '500mg'$  BID instead of '1000mg'$  BID as prescribed.  4. GAD (generalized anxiety disorder) Patient stays very anxious and worries all the time. She is  On buspar right  now and is doing well.  5. Narcotic abuse ([place]) She is currently on no narotics  6. Tobacco user Still smokes over a pack a day   7. Atherosclerosis of native coronary artery of native heart with angina pectoris ([place]) Has not seen cardiology in years. She does say that her chest feels tight today but her blood pressure and heart rate are fne.  8. Simple chronic bronchitis (HCC) Has chronic cough and was seen last week for cough meds due to cough. She is not currently on any inhalers. She use to be on symbicort and that worked well for her.  9. Gastroesophageal reflux disease without esophagitis Is on omperazole daily.  10. Recurrent major depressive disorder, in partial remission ([place]) Has been on prozac for several years. She is doing well.  11. Primary insomnia Takes ambien nightly to sleep. Sleeps well when she takes it but still wakes up frequently.  12. Stress incontinence of urine Has to wear incontinence pads  all the time  13. Morbid obesity ([place]) Weight is unchanged at this time Wt Readings from Last 3 Encounters:  06/22/19 211 lb 6.4 oz (95.9 kg)  02/05/19 212 lb (96.2 kg)  11/23/18 213 lb (96.6 kg)   BMI Readings from Last 3 Encounters:  06/22/19 38.67 kg/m  02/05/19 38.78 kg/m  11/23/18 38.96 kg/m       Outpatient Encounter Medications as of 06/22/2019  Medication Sig  . amLODipine (NORVASC) 10 MG tablet Take 1 tablet (10 mg total) by mouth daily.  . Aspirin-Acetaminophen-Caffeine (GOODYS EXTRA STRENGTH) 520-260-32.5 MG PACK Take 1 packet by mouth 2 (two) times daily as needed. For pain or headache   . benzonatate (TESSALON PERLES) 100 MG capsule Take 1 capsule (100 mg total) by mouth 3 (three) times daily as needed.  . blood glucose meter kit and supplies Dispense based on patient and insurance preference. Use up to four times daily as directed. (FOR ICD-10 E10.9, E11.9).  . busPIRone (BUSPAR) 7.5 MG tablet Take 1 tablet (7.5 mg total) by mouth daily.  . cyclobenzaprine (FLEXERIL) 10 MG tablet Take 1 tablet (10 mg total) by mouth 3 (three) times daily.  . diclofenac (VOLTAREN) 75 MG EC tablet Take 1 tablet (75 mg total) by mouth 2 (two) times daily.  Marland Kitchen ezetimibe (ZETIA) 10 MG tablet Take 1 tablet (10 mg total) by mouth daily. TAKE 1 TABLET BY MOUTH ONCE DAILY  . FLUoxetine (PROZAC) 40  MG capsule Take 1 capsule (40 mg total) by mouth 2 (two) times daily.  . hydrochlorothiazide (HYDRODIURIL) 25 MG tablet Take 1 tablet (25 mg total) by mouth daily.  Marland Kitchen ibuprofen (ADVIL,MOTRIN) 200 MG tablet Take 200 mg by mouth every 6 (six) hours as needed. Takes 800 mg when needed for menstrual cramping  . Incontinence Supplies KIT Patient needs Pull ups, chuck pads and wipes.  Marland Kitchen lisinopril (ZESTRIL) 10 MG tablet Take 1 tablet (10 mg total) by mouth daily.  . metFORMIN (GLUCOPHAGE) 1000 MG tablet TAKE 1 TABLET BY MOUTH TWICE DAILY WITH A MEAL  . nicotine (NICODERM CQ - DOSED IN MG/24 HOURS) 21  mg/24hr patch APPLY ONE PATCH TOPICALLY ONCE DAILY (Patient not taking: Reported on 02/05/2019)  . nitroGLYCERIN (NITROSTAT) 0.4 MG SL tablet DISSOLVE ONE TABLET UNDER THE TONGUE EVERY 5 MINUTES AS NEEDED FOR CHEST PAIN.  DO NOT EXCEED A TOTAL OF 3 DOSES IN 15 MINUTES  . omeprazole (PRILOSEC) 40 MG capsule Take 1 capsule by mouth once daily  . simvastatin (ZOCOR) 40 MG tablet Take 1 tablet (40 mg total) by mouth daily at 6 PM.  . zolpidem (AMBIEN) 10 MG tablet TAKE 1 TABLET BY MOUTH AT BEDTIME     Past Surgical History:  Procedure Laterality Date  . TUBAL LIGATION      Family History  Problem Relation Age of Onset  . CAD Father 1       Died age 34  . CAD Brother 72  . CAD Mother 85    New complaints: None today  Social history: Lives with her husband, children and several grandchildren  Controlled substance contract: 02/09/19    Review of Systems  Constitutional: Negative for diaphoresis.  Eyes: Negative for pain.  Respiratory: Negative for shortness of breath.   Cardiovascular: Negative for chest pain, palpitations and leg swelling.  Gastrointestinal: Negative for abdominal pain.  Endocrine: Negative for polydipsia.  Skin: Negative for rash.  Neurological: Negative for dizziness, weakness and headaches.  Hematological: Does not bruise/bleed easily.  All other systems reviewed and are negative.      Objective:   Physical Exam Vitals and nursing note reviewed.  Constitutional:      General: She is not in acute distress.    Appearance: Normal appearance. She is well-developed.  HENT:     Head: Normocephalic.     Nose: Nose normal.  Eyes:     Pupils: Pupils are equal, round, and reactive to light.  Neck:     Vascular: No carotid bruit or JVD.  Cardiovascular:     Rate and Rhythm: Normal rate and regular rhythm.     Heart sounds: Normal heart sounds.  Pulmonary:     Effort: Pulmonary effort is normal. No respiratory distress.     Breath sounds: Normal  breath sounds. No wheezing or rales.  Chest:     Chest wall: No tenderness.  Abdominal:     General: Bowel sounds are normal. There is no distension or abdominal bruit.     Palpations: Abdomen is soft. There is no hepatomegaly, splenomegaly, mass or pulsatile mass.     Tenderness: There is no abdominal tenderness.  Musculoskeletal:        General: Normal range of motion.     Cervical back: Normal range of motion and neck supple.  Lymphadenopathy:     Cervical: No cervical adenopathy.  Skin:    General: Skin is warm and dry.  Neurological:     Mental Status: She is  alert and oriented to person, place, and time.     Deep Tendon Reflexes: Reflexes are normal and symmetric.  Psychiatric:        Behavior: Behavior normal.        Thought Content: Thought content normal.        Judgment: Judgment normal.    BP 126/77   Pulse 83   Temp 98.2 F (36.8 C) (Temporal)   Ht '5\' 2"'$  (1.575 m)   Wt 211 lb 6.4 oz (95.9 kg)   BMI 38.67 kg/m   hgba1c 7.4  EKG- NSR-Mary-Margaret Hassell Done, FNP      Assessment & Plan:  Madison Gentry comes in today with chief complaint of Medical Management of Chronic Issues   Diagnosis and orders addressed:  1. Essential hypertension Low sodium diet - CBC with Differential/Platelet - CMP14+EGFR - hydrochlorothiazide (HYDRODIURIL) 25 MG tablet; Take 1 tablet (25 mg total) by mouth daily.  Dispense: 90 tablet; Refill: 1 - lisinopril (ZESTRIL) 10 MG tablet; Take 1 tablet (10 mg total) by mouth daily.  Dispense: 90 tablet; Refill: 1 - amLODipine (NORVASC) 10 MG tablet; Take 1 tablet (10 mg total) by mouth daily.  Dispense: 90 tablet; Refill: 1  2. Pure hypercholesterolemia Low fat diet - Lipid panel - simvastatin (ZOCOR) 40 MG tablet; Take 1 tablet (40 mg total) by mouth daily at 6 PM.  Dispense: 90 tablet; Refill: 1 - ezetimibe (ZETIA) 10 MG tablet; Take 1 tablet (10 mg total) by mouth daily. TAKE 1 TABLET BY MOUTH ONCE DAILY  Dispense: 90 tablet; Refill:  1  3. Type 2 diabetes mellitus with diabetic polyneuropathy, without long-term current use of insulin (HCC) Watch carbs in diet - Bayer DCA Hb A1c Waived - Microalbumin / creatinine urine ratio - metFORMIN (GLUCOPHAGE) 500 MG tablet; Take 1 tablet (500 mg total) by mouth 2 (two) times daily with a meal.  Dispense: 180 tablet; Refill: 3  4. GAD (generalized anxiety disorder) Stress management - busPIRone (BUSPAR) 7.5 MG tablet; Take 1 tablet (7.5 mg total) by mouth daily.  Dispense: 90 tablet; Refill: 2  5. Narcotic abuse ([place]) Smoking cessation encourgaed - ToxASSURE Select 13 (MW), Urine  6. Atherosclerosis of native coronary artery of native heart with angina pectoris ([place]) Needs to see cardiology  7. Simple chronic bronchitis (HCC) Added symbicort back daily - budesonide-formoterol (SYMBICORT) 80-4.5 MCG/ACT inhaler; Inhale 2 puffs into the lungs 2 (two) times daily.  Dispense: 1 Inhaler; Refill: 3  8. Gastroesophageal reflux disease without esophagitis Avoid spicy foods Do not eat 2 hours prior to bedtime - omeprazole (PRILOSEC) 40 MG capsule; Take 1 capsule (40 mg total) by mouth daily.  Dispense: 90 capsule; Refill: 1  9. Recurrent major depressive disorder, in partial remission (HCC) Stress management - FLUoxetine (PROZAC) 40 MG capsule; Take 1 capsule (40 mg total) by mouth 2 (two) times daily.  Dispense: 180 capsule; Refill: 1  10. Primary insomnia Bedtime routine - zolpidem (AMBIEN) 10 MG tablet; Take 1 tablet (10 mg total) by mouth at bedtime.  Dispense: 30 tablet; Refill: 2  11. Stress incontinence of urine - Incontinence Supplies KIT; Patient needs Pull ups, chuck pads and wipes.  Dispense: 1 kit; Refill: 0  12. Morbid obesity ([place]) Discussed diet and exercise for person with BMI >25 Will recheck weight in 3-6 months    Labs pending Health Maintenance reviewed Diet and exercise encouraged  Follow up plan: 3 months   Mary-Margaret Hassell Done, FNP

## 2019-06-22 NOTE — Patient Instructions (Signed)
Chronic Obstructive Pulmonary Disease Chronic obstructive pulmonary disease (COPD) is a long-term (chronic) lung problem. When you have COPD, it is hard for air to get in and out of your lungs. Usually the condition gets worse over time, and your lungs will never return to normal. There are things you can do to keep yourself as healthy as possible.  Your doctor may treat your condition with: ? Medicines. ? Oxygen. ? Lung surgery.  Your doctor may also recommend: ? Rehabilitation. This includes steps to make your body work better. It may involve a team of specialists. ? Quitting smoking, if you smoke. ? Exercise and changes to your diet. ? Comfort measures (palliative care). Follow these instructions at home: Medicines  Take over-the-counter and prescription medicines only as told by your doctor.  Talk to your doctor before taking any cough or allergy medicines. You may need to avoid medicines that cause your lungs to be dry. Lifestyle  If you smoke, stop. Smoking makes the problem worse. If you need help quitting, ask your doctor.  Avoid being around things that make your breathing worse. This may include smoke, chemicals, and fumes.  Stay active, but remember to rest as well.  Learn and use tips on how to relax.  Make sure you get enough sleep. Most adults need at least 7 hours of sleep every night.  Eat healthy foods. Eat smaller meals more often. Rest before meals. Controlled breathing Learn and use tips on how to control your breathing as told by your doctor. Try:  Breathing in (inhaling) through your nose for 1 second. Then, pucker your lips and breath out (exhale) through your lips for 2 seconds.  Putting one hand on your belly (abdomen). Breathe in slowly through your nose for 1 second. Your hand on your belly should move out. Pucker your lips and breathe out slowly through your lips. Your hand on your belly should move in as you breathe out.  Controlled coughing Learn  and use controlled coughing to clear mucus from your lungs. Follow these steps: 1. Lean your head a little forward. 2. Breathe in deeply. 3. Try to hold your breath for 3 seconds. 4. Keep your mouth slightly open while coughing 2 times. 5. Spit any mucus out into a tissue. 6. Rest and do the steps again 1 or 2 times as needed. General instructions  Make sure you get all the shots (vaccines) that your doctor recommends. Ask your doctor about a flu shot and a pneumonia shot.  Use oxygen therapy and pulmonary rehabilitation if told by your doctor. If you need home oxygen therapy, ask your doctor if you should buy a tool to measure your oxygen level (oximeter).  Make a COPD action plan with your doctor. This helps you to know what to do if you feel worse than usual.  Manage any other conditions you have as told by your doctor.  Avoid going outside when it is very hot, cold, or humid.  Avoid people who have a sickness you can catch (contagious).  Keep all follow-up visits as told by your doctor. This is important. Contact a doctor if:  You cough up more mucus than usual.  There is a change in the color or thickness of the mucus.  It is harder to breathe than usual.  Your breathing is faster than usual.  You have trouble sleeping.  You need to use your medicines more often than usual.  You have trouble doing your normal activities such as getting dressed   or walking around the house. Get help right away if:  You have shortness of breath while resting.  You have shortness of breath that stops you from: ? Being able to talk. ? Doing normal activities.  Your chest hurts for longer than 5 minutes.  Your skin color is more blue than usual.  Your pulse oximeter shows that you have low oxygen for longer than 5 minutes.  You have a fever.  You feel too tired to breathe normally. Summary  Chronic obstructive pulmonary disease (COPD) is a long-term lung problem.  The way your  lungs work will never return to normal. Usually the condition gets worse over time. There are things you can do to keep yourself as healthy as possible.  Take over-the-counter and prescription medicines only as told by your doctor.  If you smoke, stop. Smoking makes the problem worse. This information is not intended to replace advice given to you by your health care provider. Make sure you discuss any questions you have with your health care provider. Document Revised: 01/25/2017 Document Reviewed: 03/19/2016 Elsevier Patient Education  2020 Else[employer] Inc.  

## 2019-06-23 LAB — CMP14+EGFR
ALT: 25 IU/L (ref 0–32)
AST: 18 IU/L (ref 0–40)
Albumin/Globulin Ratio: 1.8 (ref 1.2–2.2)
Albumin: 4 g/dL (ref 3.8–4.9)
Alkaline Phosphatase: 74 IU/L (ref 39–117)
BUN/Creatinine Ratio: 17 (ref 9–23)
BUN: 9 mg/dL (ref 6–24)
Bilirubin Total: <0.2 mg/dL (ref 0.0–1.2)
CO2: 26 mmol/L (ref 20–29)
Calcium: 9.4 mg/dL (ref 8.7–10.2)
Chloride: 99 mmol/L (ref 96–106)
Creatinine, Ser: 0.53 mg/dL — ABNORMAL LOW (ref 0.57–1.00)
GFR calc Af Amer: 127 mL/min/{1.73_m2} (ref >59)
GFR calc non Af Amer: 110 mL/min/{1.73_m2} (ref >59)
Globulin, Total: 2.2 g/dL (ref 1.5–4.5)
Glucose: 109 mg/dL — ABNORMAL HIGH (ref 65–99)
Potassium: 4.2 mmol/L (ref 3.5–5.2)
Sodium: 138 mmol/L (ref 134–144)
Total Protein: 6.2 g/dL (ref 6.0–8.5)

## 2019-06-23 LAB — LIPID PANEL
Chol/HDL Ratio: 3.1 ratio (ref 0.0–4.4)
Cholesterol, Total: 139 mg/dL (ref 100–199)
HDL: 45 mg/dL (ref >39)
LDL Chol Calc (NIH): 69 mg/dL (ref 0–99)
Triglycerides: 146 mg/dL (ref 0–149)
VLDL Cholesterol Cal: 25 mg/dL (ref 5–40)

## 2019-06-23 LAB — MICROALBUMIN / CREATININE URINE RATIO
Creatinine, Urine: 52.9 mg/dL
Microalb/Creat Ratio: <6 mg/g creat (ref 0–29)
Microalbumin, Urine: <3 ug/mL

## 2019-06-23 LAB — CBC WITH DIFFERENTIAL/PLATELET
Basophils Absolute: 0.1 10*3/uL (ref 0.0–0.2)
Basos: 1 %
EOS (ABSOLUTE): 0.4 10*3/uL (ref 0.0–0.4)
Eos: 3 %
Hematocrit: 39.5 % (ref 34.0–46.6)
Hemoglobin: 13.3 g/dL (ref 11.1–15.9)
Immature Grans (Abs): 0 10*3/uL (ref 0.0–0.1)
Immature Granulocytes: 0 %
Lymphocytes Absolute: 3.3 10*3/uL — ABNORMAL HIGH (ref 0.7–3.1)
Lymphs: 26 %
MCH: 29.1 pg (ref 26.6–33.0)
MCHC: 33.7 g/dL (ref 31.5–35.7)
MCV: 86 fL (ref 79–97)
Monocytes Absolute: 0.7 10*3/uL (ref 0.1–0.9)
Monocytes: 5 %
Neutrophils Absolute: 8.2 10*3/uL — ABNORMAL HIGH (ref 1.4–7.0)
Neutrophils: 65 %
Platelets: 434 10*3/uL (ref 150–450)
RBC: 4.57 x10E6/uL (ref 3.77–5.28)
RDW: 13 % (ref 11.7–15.4)
WBC: 12.7 10*3/uL — ABNORMAL HIGH (ref 3.4–10.8)

## 2019-06-24 LAB — TOXASSURE SELECT 13 (MW), URINE

## 2019-10-05 ENCOUNTER — Encounter: Payer: Self-pay | Admitting: Nurse Practitioner

## 2019-10-05 ENCOUNTER — Other Ambulatory Visit: Payer: Self-pay

## 2019-10-05 ENCOUNTER — Ambulatory Visit: Payer: Medicaid Other | Admitting: Nurse Practitioner

## 2019-10-05 VITALS — BP 119/70 | HR 84 | Temp 97.8°F | Resp 20 | Ht 62.0 in | Wt 215.0 lb

## 2019-10-05 DIAGNOSIS — F411 Generalized anxiety disorder: Secondary | ICD-10-CM

## 2019-10-05 DIAGNOSIS — K219 Gastro-esophageal reflux disease without esophagitis: Secondary | ICD-10-CM

## 2019-10-05 DIAGNOSIS — Z1159 Encounter for screening for other viral diseases: Secondary | ICD-10-CM

## 2019-10-05 DIAGNOSIS — N3946 Mixed incontinence: Secondary | ICD-10-CM

## 2019-10-05 DIAGNOSIS — F3341 Major depressive disorder, recurrent, in partial remission: Secondary | ICD-10-CM

## 2019-10-05 DIAGNOSIS — Z720 Tobacco use: Secondary | ICD-10-CM

## 2019-10-05 DIAGNOSIS — J410 Simple chronic bronchitis: Secondary | ICD-10-CM

## 2019-10-05 DIAGNOSIS — E7800 Pure hypercholesterolemia, unspecified: Secondary | ICD-10-CM

## 2019-10-05 DIAGNOSIS — I10 Essential (primary) hypertension: Secondary | ICD-10-CM

## 2019-10-05 DIAGNOSIS — F5101 Primary insomnia: Secondary | ICD-10-CM

## 2019-10-05 DIAGNOSIS — E1142 Type 2 diabetes mellitus with diabetic polyneuropathy: Secondary | ICD-10-CM

## 2019-10-05 LAB — BAYER DCA HB A1C WAIVED: HB A1C (BAYER DCA - WAIVED): 7.3 % — ABNORMAL HIGH (ref <7.0)

## 2019-10-05 MED ORDER — ZOLPIDEM TARTRATE 10 MG PO TABS: 10.0000 mg | ORAL_TABLET | Freq: Every day | ORAL | 2 refills | Status: DC

## 2019-10-05 MED ORDER — FLUOXETINE HCL 40 MG PO CAPS: 40.0000 mg | ORAL_CAPSULE | Freq: Two times a day (BID) | ORAL | 1 refills | Status: DC

## 2019-10-05 MED ORDER — SIMVASTATIN 40 MG PO TABS: 40.0000 mg | ORAL_TABLET | Freq: Every day | ORAL | 1 refills | Status: DC

## 2019-10-05 MED ORDER — BLOOD GLUCOSE METER KIT: PACK | 0 refills | Status: DC

## 2019-10-05 MED ORDER — OMEPRAZOLE 40 MG PO CPDR: 40.0000 mg | DELAYED_RELEASE_CAPSULE | Freq: Every day | ORAL | 1 refills | Status: DC

## 2019-10-05 MED ORDER — BUDESONIDE-FORMOTEROL FUMARATE 80-4.5 MCG/ACT IN AERO: 2.0000 | INHALATION_SPRAY | Freq: Two times a day (BID) | RESPIRATORY_TRACT | 5 refills | Status: DC

## 2019-10-05 MED ORDER — AMLODIPINE BESYLATE 10 MG PO TABS: 10.0000 mg | ORAL_TABLET | Freq: Every day | ORAL | 1 refills | Status: DC

## 2019-10-05 MED ORDER — BUSPIRONE HCL 7.5 MG PO TABS: 7.5000 mg | ORAL_TABLET | Freq: Every day | ORAL | 2 refills | Status: DC

## 2019-10-05 MED ORDER — LISINOPRIL 10 MG PO TABS: 10.0000 mg | ORAL_TABLET | Freq: Every day | ORAL | 1 refills | Status: DC

## 2019-10-05 MED ORDER — METFORMIN HCL 500 MG PO TABS: 500.0000 mg | ORAL_TABLET | Freq: Two times a day (BID) | ORAL | 3 refills | Status: DC

## 2019-10-05 MED ORDER — HYDROCHLOROTHIAZIDE 25 MG PO TABS: 25.0000 mg | ORAL_TABLET | Freq: Every day | ORAL | 1 refills | Status: DC

## 2019-10-05 NOTE — Progress Notes (Addendum)
Subjective:    Patient ID: Madison Gentry, female    DOB: [birth year], 54 y.o.   MRN: [id number]   Chief Complaint: Medical Management of Chronic Issues    HPI:  1. Essential hypertension No c/o chest pain, sob or headache. Does not check blood pressure at home. BP Readings from Last 3 Encounters:  06/22/19 126/77  02/05/19 (!) 165/102  11/23/18 (!) 144/90     2. Pure hypercholesterolemia Does not watch diet and does  no exercise. Lab Results  Component Value Date   CHOL 139 06/22/2019   HDL 45 06/22/2019   LDLCALC 69 06/22/2019   TRIG 146 06/22/2019   CHOLHDL 3.1 06/22/2019   The 10-year ASCVD risk score Mikey Bussing DC Jr., et al., 2013) is: 7.9%   Values used to calculate the score:     Age: 3 years     Sex: Female     Is Non-Hispanic African American: No     Diabetic: Yes     Tobacco smoker: Yes     Systolic Blood Pressure: 683 mmHg     Is BP treated: Yes     HDL Cholesterol: 45 mg/dL     Total Cholesterol: 139 mg/dL   3. Type 2 diabetes mellitus with diabetic polyneuropathy, without long-term current use of insulin (HCC) She does not check her blood sugars at home. Nor does she watch her diet. Lab Results  Component Value Date   HGBA1C 7.4 (H) 06/22/2019      4. Simple chronic bronchitis (HCC) Is on symbicort daily and is doing well. Has not needed albuterol inhaler.  5. Gastroesophageal reflux disease without esophagitis Is on omeprazole daily. Works well to keep symptoms under control.  6. GAD (generalized anxiety disorder) Is on buspar and that is working well for her anxiety.  7. Recurrent major depressive disorder, in partial remission ([place]) She has been on prozac for many years and says it is still working well for her. Depression screen [facility] 2/9 06/22/2019 02/05/2019 11/19/2018  Decreased Interest 1 0 0  Down, Depressed, Hopeless 1 0 0  PHQ - 2 Score 2 0 0  Altered sleeping 1 - 0  Tired, decreased energy 3 - 0  Change in appetite 3 - 0  Feeling bad  or failure about yourself  0 - 0  Trouble concentrating 0 - 0  Moving slowly or fidgety/restless 0 - 0  Suicidal thoughts 0 - 0  PHQ-9 Score 9 - 0  Difficult doing work/chores Somewhat difficult - Not difficult at all  Some recent data might be hidden     8. Primary insomnia Does not sleep good. Thinks it is coming some from her menopause.  9. Tobacco user *smokes over a pack a day. Would like to quit but says that she can't.    10. Urinary incontinance No better. Uses incontinence supplies daily as well as pads in bed at night.    11. Morbid obesity ([place]) Weight is up 4lbs since last visit Wt Readings from Last 3 Encounters:  10/05/19 215 lb (97.5 kg)  06/22/19 211 lb 6.4 oz (95.9 kg)  02/05/19 212 lb (96.2 kg)   BMI Readings from Last 3 Encounters:  10/05/19 39.32 kg/m  06/22/19 38.67 kg/m  02/05/19 38.78 kg/m       Outpatient Encounter Medications as of 10/05/2019  Medication Sig  . amLODipine (NORVASC) 10 MG tablet Take 1 tablet (10 mg total) by mouth daily.  . Aspirin-Acetaminophen-Caffeine (GOODYS EXTRA STRENGTH) 520-260-32.5 MG PACK Take 1  packet by mouth 2 (two) times daily as needed. For pain or headache   . benzonatate (TESSALON PERLES) 100 MG capsule Take 1 capsule (100 mg total) by mouth 3 (three) times daily as needed.  . blood glucose meter kit and supplies Dispense based on patient and insurance preference. Use up to four times daily as directed. (FOR ICD-10 E10.9, E11.9).  . budesonide-formoterol (SYMBICORT) 80-4.5 MCG/ACT inhaler Inhale 2 puffs into the lungs 2 (two) times daily.  . busPIRone (BUSPAR) 7.5 MG tablet Take 1 tablet (7.5 mg total) by mouth daily.  . cyclobenzaprine (FLEXERIL) 10 MG tablet Take 1 tablet (10 mg total) by mouth 3 (three) times daily.  Marland Kitchen ezetimibe (ZETIA) 10 MG tablet Take 1 tablet (10 mg total) by mouth daily. TAKE 1 TABLET BY MOUTH ONCE DAILY  . FLUoxetine (PROZAC) 40 MG capsule Take 1 capsule (40 mg total) by mouth 2 (two)  times daily.  . hydrochlorothiazide (HYDRODIURIL) 25 MG tablet Take 1 tablet (25 mg total) by mouth daily.  Marland Kitchen ibuprofen (ADVIL,MOTRIN) 200 MG tablet Take 200 mg by mouth every 6 (six) hours as needed. Takes 800 mg when needed for menstrual cramping  . Incontinence Supplies KIT Patient needs Pull ups, chuck pads and wipes.  Marland Kitchen lisinopril (ZESTRIL) 10 MG tablet Take 1 tablet (10 mg total) by mouth daily.  . metFORMIN (GLUCOPHAGE) 500 MG tablet Take 1 tablet (500 mg total) by mouth 2 (two) times daily with a meal.  . nicotine (NICODERM CQ - DOSED IN MG/24 HOURS) 21 mg/24hr patch APPLY ONE PATCH TOPICALLY ONCE DAILY (Patient not taking: Reported on 06/22/2019)  . nitroGLYCERIN (NITROSTAT) 0.4 MG SL tablet DISSOLVE ONE TABLET UNDER THE TONGUE EVERY 5 MINUTES AS NEEDED FOR CHEST PAIN.  DO NOT EXCEED A TOTAL OF 3 DOSES IN 15 MINUTES  . omeprazole (PRILOSEC) 40 MG capsule Take 1 capsule (40 mg total) by mouth daily.  . simvastatin (ZOCOR) 40 MG tablet Take 1 tablet (40 mg total) by mouth daily at 6 PM.  . zolpidem (AMBIEN) 10 MG tablet Take 1 tablet (10 mg total) by mouth at bedtime.     Past Surgical History:  Procedure Laterality Date  . TUBAL LIGATION      Family History  Problem Relation Age of Onset  . CAD Father 54       Died age 49  . CAD Brother 22  . CAD Mother 58    New complaints: Having mood swings and hot flashes- would like to start in hormones  Social history: Lives with husband and helps keep grand kids  Controlled substance contract: 02/09/19     Review of Systems  Constitutional: Negative for diaphoresis.  Eyes: Negative for pain.  Respiratory: Negative for shortness of breath.   Cardiovascular: Negative for chest pain, palpitations and leg swelling.  Gastrointestinal: Negative for abdominal pain.  Endocrine: Negative for polydipsia.  Skin: Negative for rash.  Neurological: Negative for dizziness, weakness and headaches.  Hematological: Does not bruise/bleed  easily.  All other systems reviewed and are negative.      Objective:   Physical Exam Vitals and nursing note reviewed.  Constitutional:      General: She is not in acute distress.    Appearance: Normal appearance. She is well-developed.  HENT:     Head: Normocephalic.     Nose: Nose normal.  Eyes:     Pupils: Pupils are equal, round, and reactive to light.  Neck:     Vascular: No carotid bruit or  JVD.  Cardiovascular:     Rate and Rhythm: Normal rate and regular rhythm.     Heart sounds: Normal heart sounds.  Pulmonary:     Effort: Pulmonary effort is normal. No respiratory distress.     Breath sounds: Normal breath sounds. No wheezing or rales.  Chest:     Chest wall: No tenderness.  Abdominal:     General: Bowel sounds are normal. There is no distension or abdominal bruit.     Palpations: Abdomen is soft. There is no hepatomegaly, splenomegaly, mass or pulsatile mass.     Tenderness: There is no abdominal tenderness.  Musculoskeletal:        General: Normal range of motion.     Cervical back: Normal range of motion and neck supple.  Lymphadenopathy:     Cervical: No cervical adenopathy.  Skin:    General: Skin is warm and dry.  Neurological:     Mental Status: She is alert and oriented to person, place, and time.     Deep Tendon Reflexes: Reflexes are normal and symmetric.  Psychiatric:        Behavior: Behavior normal.        Thought Content: Thought content normal.        Judgment: Judgment normal.    BP 119/70   Pulse 84   Temp 97.8 F (36.6 C) (Temporal)   Resp 20   Ht 5\' 2"  (1.575 m)   Wt 215 lb (97.5 kg)   SpO2 91%   BMI 39.32 kg/m   hgba1c 7.3%     Assessment & Plan:  Kelisha Dall comes in today with chief complaint of Medical Management of Chronic Issues   Diagnosis and orders addressed:  1. Essential hypertension Low sodium diet - CBC with Differential/Platelet - CMP14+EGFR - hydrochlorothiazide (HYDRODIURIL) 25 MG tablet; Take 1 tablet  (25 mg total) by mouth daily.  Dispense: 90 tablet; Refill: 1 - lisinopril (ZESTRIL) 10 MG tablet; Take 1 tablet (10 mg total) by mouth daily.  Dispense: 90 tablet; Refill: 1 - amLODipine (NORVASC) 10 MG tablet; Take 1 tablet (10 mg total) by mouth daily.  Dispense: 90 tablet; Refill: 1  2. Pure hypercholesterolemia Low fat diet - Lipid panel - simvastatin (ZOCOR) 40 MG tablet; Take 1 tablet (40 mg total) by mouth daily at 6 PM.  Dispense: 90 tablet; Refill: 1  3. Type 2 diabetes mellitus with diabetic polyneuropathy, without long-term current use of insulin (HCC) Continue to watch carbs in diet - Bayer DCA Hb A1c Waived - metFORMIN (GLUCOPHAGE) 500 MG tablet; Take 1 tablet (500 mg total) by mouth 2 (two) times daily with a meal.  Dispense: 180 tablet; Refill: 3  4. Simple chronic bronchitis (HCC) Smoking cessation encouraged - budesonide-formoterol (SYMBICORT) 80-4.5 MCG/ACT inhaler; Inhale 2 puffs into the lungs 2 (two) times daily.  Dispense: 10.2 g; Refill: 5  5. Gastroesophageal reflux disease without esophagitis Avoid spicy foods Do not eat 2 hours prior to bedtime - omeprazole (PRILOSEC) 40 MG capsule; Take 1 capsule (40 mg total) by mouth daily.  Dispense: 90 capsule; Refill: 1  6. GAD (generalized anxiety disorder) Stress management - busPIRone (BUSPAR) 7.5 MG tablet; Take 1 tablet (7.5 mg total) by mouth daily.  Dispense: 90 tablet; Refill: 2  7. Recurrent major depressive disorder, in partial remission (HCC) - FLUoxetine (PROZAC) 40 MG capsule; Take 1 capsule (40 mg total) by mouth 2 (two) times daily.  Dispense: 180 capsule; Refill: 1  8. Primary insomnia Bedtime routine -  zolpidem (AMBIEN) 10 MG tablet; Take 1 tablet (10 mg total) by mouth at bedtime.  Dispense: 30 tablet; Refill: 2  9. Tobacco user Smoking cessation encouraged  10 urinary incontinence Ordered incontinence supplies   11. Morbid obesity ([place]) Discussed diet and exercise for person with BMI  >25 Will recheck weight in 3-6 months  12. Need for hepatitis C screening test Labs pending - Hepatitis C antibody    13. Menopausal Must have mammogram before starting anything Discussed prempro side effects.   Labs pending Health Maintenance reviewed Diet and exercise encouraged  Follow up plan: 3 months   Mary-Margaret Hassell Done, FNP

## 2019-10-05 NOTE — Patient Instructions (Signed)
Conjugated Estrogens; Medroxyprogesterone tablets What is this medicine? CONJUGATED ESTROGENS; MEDROXYPROGESTERONE (CON ju gate ed ESS troe jenz; me DROX ee proe JES te rone) is used as hormone replacement in menopausal women who still have their uterus. This medicine helps to treat hot flashes and prevent osteoporosis (weak bones). This medicine may be used for other purposes; ask your health care provider or pharmacist if you have questions. COMMON BRAND NAME(S): Premphase, Prempro What should I tell my health care provider before I take this medicine? They need to know if you have any of these conditions:  blood vessel disease or blood clots   breast, cervical, endometrial, or uterine cancer  diabetes  endometriosis  fibroids  gallbladder disease  heart disease or recent heart attack  high blood cholesterol  high blood pressure  high level of calcium in the blood  hysterectomy  kidney disease  liver disease  mental depression  migraine headaches  porphyria  protein C deficiency  protein S deficiency  stroke  systemic lupus erythematosus (SLE)  tobacco smoker  vaginal bleeding  an unusual or allergic reaction to estrogens, progestiins, other medicines, foods, dyes, or preservatives  pregnant or trying to get pregnant  breast-feeding How should I use this medicine? Take this medicine by mouth with a drink of water. You may take this medicine with food. Follow the directions on the prescription label. You will take one tablet daily at roughly the same time each day. Do not take your medicine more often than directed. Talk to your pediatrician regarding the use of this medicine in children. Special care may be needed. A patient package insert for the product will be given with each prescription and refill. Read this sheet carefully each time. The sheet may change frequently. Overdosage: If you think you have taken too much of this medicine contact a poison  control center or emergency room at once. NOTE: This medicine is only for you. Do not share this medicine with others. What if I miss a dose? If you miss a dose, take it as soon as you can. If it is almost time for your next dose, take only that dose. Do not take double or extra doses. What may interact with this medicine?  barbiturates, such as phenobarbital  benzodiazepines  bosentan  bromocriptine  carbamazepine  cimetidine  cyclosporine  dantrolene  grapefruit juice  griseofulvin  hydrocortisone, cortisone, or prednisolone  isoniazid (INH)  medications for diabetes  methotrexate  mineral oil  phenytoin  raloxifene  rifabutin, rifampin, or rifapentine  tamoxifen  thyroid hormones  topiramate  tricyclic antidepressants  warfarin This list may not describe all possible interactions. Give your health care provider a list of all the medicines, herbs, non-prescription drugs, or dietary supplements you use. Also tell them if you smoke, drink alcohol, or use illegal drugs. Some items may interact with your medicine. What should I watch for while using this medicine? Visit your health care professional for regular checks on your progress. You will need a regular breast and pelvic exam. You should also discuss the need for regular mammograms with your health care professional, and follow his or her guidelines. This medicine can make your body retain fluid, making your fingers, hands, or ankles swell. Your blood pressure can go up. Contact your doctor or health care professional if you feel you are retaining fluid. If you have any reason to think you are pregnant; stop taking this medicine at once and contact your doctor or health care professional. Tobacco smoking   increases the risk of getting a blood clot or having a stroke, especially if you are more than 52 years old. You are strongly advised not to smoke. If you wear contact lenses and notice visual changes, or  if the lenses begin to feel uncomfortable, consult your eye care specialist. If you are going to have elective surgery, you may need to stop taking this medicine beforehand. Consult your health care professional for advice prior to scheduling the surgery. What side effects may I notice from receiving this medicine? Side effects that you should report to your doctor or health care professional as soon as possible:  allergic reactions like skin rash, itching or hives, swelling of the face, lips, or tongue  breast tissue changes or discharge  changes in vision  chest pain  confusion, trouble speaking or understanding  dark urine  general ill feeling or flu-like symptoms  light-colored stools  nausea, vomiting  pain, swelling, warmth in the leg  right upper belly pain  severe headaches  shortness of breath  sudden numbness or weakness of the face, arm or leg  trouble walking, dizziness, loss of balance or coordination  unusual vaginal bleeding  yellowing of the eyes or skin Side effects that usually do not require medical attention (report to your doctor or health care professional if they continue or are bothersome):  hair loss  increased hunger or thirst  increased urination  symptoms of vaginal infection like itching, irritation or unusual discharge  unusually weak or tired This list may not describe all possible side effects. Call your doctor for medical advice about side effects. You may report side effects to FDA at 1-800-FDA-1088. Where should I keep my medicine? Keep out of the reach of children. Store at room temperature between 15 and 30 degrees C (59 and 86 degrees F). Throw away any unused medicine after the expiration date. NOTE: This sheet is a summary. It may not cover all possible information. If you have questions about this medicine, talk to your doctor, pharmacist, or health care provider.  2020 Elsevier/Gold Standard (2010-05-17 09:20:18)  

## 2019-10-06 LAB — LIPID PANEL
Chol/HDL Ratio: 3 ratio (ref 0.0–4.4)
Cholesterol, Total: 138 mg/dL (ref 100–199)
HDL: 46 mg/dL (ref >39)
LDL Chol Calc (NIH): 69 mg/dL (ref 0–99)
Triglycerides: 131 mg/dL (ref 0–149)
VLDL Cholesterol Cal: 23 mg/dL (ref 5–40)

## 2019-10-06 LAB — CMP14+EGFR
ALT: 29 IU/L (ref 0–32)
AST: 21 IU/L (ref 0–40)
Albumin/Globulin Ratio: 1.8 (ref 1.2–2.2)
Albumin: 4.1 g/dL (ref 3.8–4.9)
Alkaline Phosphatase: 73 IU/L (ref 48–121)
BUN/Creatinine Ratio: 19 (ref 9–23)
BUN: 9 mg/dL (ref 6–24)
Bilirubin Total: <0.2 mg/dL (ref 0.0–1.2)
CO2: 27 mmol/L (ref 20–29)
Calcium: 9.2 mg/dL (ref 8.7–10.2)
Chloride: 101 mmol/L (ref 96–106)
Creatinine, Ser: 0.48 mg/dL — ABNORMAL LOW (ref 0.57–1.00)
GFR calc Af Amer: 130 mL/min/{1.73_m2} (ref >59)
GFR calc non Af Amer: 113 mL/min/{1.73_m2} (ref >59)
Globulin, Total: 2.3 g/dL (ref 1.5–4.5)
Glucose: 95 mg/dL (ref 65–99)
Potassium: 4.5 mmol/L (ref 3.5–5.2)
Sodium: 139 mmol/L (ref 134–144)
Total Protein: 6.4 g/dL (ref 6.0–8.5)

## 2019-10-06 LAB — CBC WITH DIFFERENTIAL/PLATELET
Basophils Absolute: 0.1 10*3/uL (ref 0.0–0.2)
Basos: 1 %
EOS (ABSOLUTE): 0.5 10*3/uL — ABNORMAL HIGH (ref 0.0–0.4)
Eos: 4 %
Hematocrit: 42 % (ref 34.0–46.6)
Hemoglobin: 13.8 g/dL (ref 11.1–15.9)
Immature Grans (Abs): 0 10*3/uL (ref 0.0–0.1)
Immature Granulocytes: 0 %
Lymphocytes Absolute: 3.7 10*3/uL — ABNORMAL HIGH (ref 0.7–3.1)
Lymphs: 28 %
MCH: 28.6 pg (ref 26.6–33.0)
MCHC: 32.9 g/dL (ref 31.5–35.7)
MCV: 87 fL (ref 79–97)
Monocytes Absolute: 0.8 10*3/uL (ref 0.1–0.9)
Monocytes: 6 %
Neutrophils Absolute: 7.9 10*3/uL — ABNORMAL HIGH (ref 1.4–7.0)
Neutrophils: 61 %
Platelets: 420 10*3/uL (ref 150–450)
RBC: 4.82 x10E6/uL (ref 3.77–5.28)
RDW: 13.6 % (ref 11.7–15.4)
WBC: 13.2 10*3/uL — ABNORMAL HIGH (ref 3.4–10.8)

## 2019-10-07 LAB — SPECIMEN STATUS REPORT

## 2019-10-07 LAB — HEPATITIS C ANTIBODY: Hep C Virus Ab: <0.1 s/co ratio (ref 0.0–0.9)

## 2019-10-08 ENCOUNTER — Other Ambulatory Visit: Payer: Self-pay | Admitting: Nurse Practitioner

## 2019-10-08 DIAGNOSIS — Z1231 Encounter for screening mammogram for malignant neoplasm of breast: Secondary | ICD-10-CM

## 2019-10-20 ENCOUNTER — Other Ambulatory Visit: Payer: Self-pay | Admitting: Nurse Practitioner

## 2019-10-20 NOTE — Telephone Encounter (Signed)
Cough comes & goes, but has had for last 2-3 nights waking her up and she is having to sleep upright in the bed Please advise. Knows you will not be back in the office until Thursday

## 2019-10-20 NOTE — Telephone Encounter (Signed)
She should run humidifier in run at night. Mucinex OTC for wet cough and delsym OTC for a dry cough

## 2019-10-20 NOTE — Telephone Encounter (Signed)
  Prescription Request  10/20/2019  What is the name of the medication or equipment? Tessalon perles  Have you contacted your pharmacy to request a refill? (if applicable) no  Which pharmacy would you like this sent to? [facility]   Patient notified that their request is being sent to the clinical staff for review and that they should receive a response within 2 business days.

## 2019-10-20 NOTE — Telephone Encounter (Signed)
Pt aware of provider feedback and voiced understanding. 

## 2019-12-22 ENCOUNTER — Ambulatory Visit: Payer: Self-pay | Admitting: Nurse Practitioner

## 2020-01-05 ENCOUNTER — Ambulatory Visit: Payer: Self-pay | Admitting: Nurse Practitioner

## 2020-01-07 ENCOUNTER — Encounter: Payer: Self-pay | Admitting: Nurse Practitioner

## 2020-01-08 ENCOUNTER — Other Ambulatory Visit: Payer: Self-pay | Admitting: Nurse Practitioner

## 2020-01-08 DIAGNOSIS — F5101 Primary insomnia: Secondary | ICD-10-CM

## 2020-01-11 ENCOUNTER — Other Ambulatory Visit: Payer: Self-pay | Admitting: Nurse Practitioner

## 2020-01-11 DIAGNOSIS — F5101 Primary insomnia: Secondary | ICD-10-CM

## 2020-01-14 ENCOUNTER — Other Ambulatory Visit: Payer: Self-pay | Admitting: Nurse Practitioner

## 2020-01-14 ENCOUNTER — Telehealth: Payer: Self-pay

## 2020-01-14 DIAGNOSIS — F5101 Primary insomnia: Secondary | ICD-10-CM

## 2020-01-14 MED ORDER — ZOLPIDEM TARTRATE 10 MG PO TABS: 10.0000 mg | ORAL_TABLET | Freq: Every day | ORAL | 0 refills | Status: DC

## 2020-01-14 NOTE — Telephone Encounter (Signed)
  Prescription Request  01/14/2020  What is the name of the medication or equipment? Zolpidem 10 mg Patient has appt 12-20 and needs enough to get through til then. Has been out three days  Have you contacted your pharmacy to request a refill? (if applicable) YES  Which pharmacy would you like this sent to? [facility] in [place]   Patient notified that their request is being sent to the clinical staff for review and that they should receive a response within 2 business days.

## 2020-02-06 ENCOUNTER — Other Ambulatory Visit: Payer: Self-pay | Admitting: Nurse Practitioner

## 2020-02-06 DIAGNOSIS — I10 Essential (primary) hypertension: Secondary | ICD-10-CM

## 2020-02-15 ENCOUNTER — Ambulatory Visit: Payer: Medicaid Other | Admitting: Nurse Practitioner

## 2020-02-15 ENCOUNTER — Other Ambulatory Visit: Payer: Self-pay

## 2020-02-15 ENCOUNTER — Encounter: Payer: Self-pay | Admitting: Nurse Practitioner

## 2020-02-15 VITALS — BP 142/83 | HR 83 | Temp 98.3°F | Resp 20 | Ht 62.0 in | Wt 207.0 lb

## 2020-02-15 DIAGNOSIS — J410 Simple chronic bronchitis: Secondary | ICD-10-CM

## 2020-02-15 DIAGNOSIS — E1142 Type 2 diabetes mellitus with diabetic polyneuropathy: Secondary | ICD-10-CM | POA: Diagnosis not present

## 2020-02-15 DIAGNOSIS — K219 Gastro-esophageal reflux disease without esophagitis: Secondary | ICD-10-CM

## 2020-02-15 DIAGNOSIS — I25119 Atherosclerotic heart disease of native coronary artery with unspecified angina pectoris: Secondary | ICD-10-CM

## 2020-02-15 DIAGNOSIS — E7800 Pure hypercholesterolemia, unspecified: Secondary | ICD-10-CM

## 2020-02-15 DIAGNOSIS — N393 Stress incontinence (female) (male): Secondary | ICD-10-CM

## 2020-02-15 DIAGNOSIS — Z720 Tobacco use: Secondary | ICD-10-CM

## 2020-02-15 DIAGNOSIS — F1110 Opioid abuse, uncomplicated: Secondary | ICD-10-CM

## 2020-02-15 DIAGNOSIS — F411 Generalized anxiety disorder: Secondary | ICD-10-CM

## 2020-02-15 DIAGNOSIS — I10 Essential (primary) hypertension: Secondary | ICD-10-CM | POA: Diagnosis not present

## 2020-02-15 DIAGNOSIS — F3341 Major depressive disorder, recurrent, in partial remission: Secondary | ICD-10-CM

## 2020-02-15 DIAGNOSIS — F5101 Primary insomnia: Secondary | ICD-10-CM

## 2020-02-15 LAB — BAYER DCA HB A1C WAIVED: HB A1C (BAYER DCA - WAIVED): 7.6 % — ABNORMAL HIGH (ref <7.0)

## 2020-02-15 MED ORDER — BUSPIRONE HCL 7.5 MG PO TABS: 7.5000 mg | ORAL_TABLET | Freq: Every day | ORAL | 2 refills | Status: DC

## 2020-02-15 MED ORDER — LISINOPRIL 10 MG PO TABS: 10.0000 mg | ORAL_TABLET | Freq: Every day | ORAL | 1 refills | Status: DC

## 2020-02-15 MED ORDER — METFORMIN HCL 500 MG PO TABS
500.0000 mg | ORAL_TABLET | Freq: Two times a day (BID) | ORAL | 1 refills | Status: DC
Start: 2020-02-15 — End: 2020-08-25

## 2020-02-15 MED ORDER — FLUOXETINE HCL 40 MG PO CAPS: 40.0000 mg | ORAL_CAPSULE | Freq: Two times a day (BID) | ORAL | 1 refills | Status: DC

## 2020-02-15 MED ORDER — BUDESONIDE-FORMOTEROL FUMARATE 80-4.5 MCG/ACT IN AERO: 2.0000 | INHALATION_SPRAY | Freq: Two times a day (BID) | RESPIRATORY_TRACT | 5 refills | Status: DC

## 2020-02-15 MED ORDER — OMEPRAZOLE 40 MG PO CPDR
40.0000 mg | DELAYED_RELEASE_CAPSULE | Freq: Every day | ORAL | 1 refills | Status: DC
Start: 2020-02-15 — End: 2020-08-16

## 2020-02-15 MED ORDER — SIMVASTATIN 40 MG PO TABS
40.0000 mg | ORAL_TABLET | Freq: Every day | ORAL | 1 refills | Status: DC
Start: 2020-02-15 — End: 2020-08-25

## 2020-02-15 MED ORDER — ZOLPIDEM TARTRATE 10 MG PO TABS: 10.0000 mg | ORAL_TABLET | Freq: Every day | ORAL | 2 refills | Status: DC

## 2020-02-15 MED ORDER — HYDROCHLOROTHIAZIDE 25 MG PO TABS
25.0000 mg | ORAL_TABLET | Freq: Every day | ORAL | 1 refills | Status: DC
Start: 2020-02-15 — End: 2020-08-12

## 2020-02-15 MED ORDER — AMLODIPINE BESYLATE 10 MG PO TABS
10.0000 mg | ORAL_TABLET | Freq: Every day | ORAL | 1 refills | Status: DC
Start: 2020-02-15 — End: 2020-08-25

## 2020-02-15 MED ORDER — EZETIMIBE 10 MG PO TABS
10.0000 mg | ORAL_TABLET | Freq: Every day | ORAL | 1 refills | Status: DC
Start: 2020-02-15 — End: 2020-08-25

## 2020-02-15 NOTE — Progress Notes (Signed)
Subjective:    Patient ID: Madison Gentry, female    DOB: Dec 25, 1965, 54 y.o.   MRN: [id number]   Chief Complaint: Medical Management of Chronic Issues    HPI:  1. Type 2 diabetes mellitus with diabetic polyneuropathy, without long-term current use of insulin (HCC) She has not been checking her blood sugars. She says she has felt fine , so she is sure they have been ok. Lab Results  Component Value Date   HGBA1C 7.3 (H) 10/05/2019     2. Pure hypercholesterolemia Does not watch diet and does very little exercise. Lab Results  Component Value Date   CHOL 138 10/05/2019   HDL 46 10/05/2019   LDLCALC 69 10/05/2019   TRIG 131 10/05/2019   CHOLHDL 3.0 10/05/2019     3. Primary hypertension No c/o chest pain, sob or headache. Does not check blood pressure at home. BP Readings from Last 3 Encounters:  10/05/19 119/70  06/22/19 126/77  02/05/19 (!) 165/102     4. Atherosclerosis of native coronary artery of native heart with angina pectoris ([place]) Was seen on  Routine chest xray- patient does not seen cardiologist.  5. Simple chronic bronchitis ([place]) Still smokes, so has chronic cough. Is on symbicort daily. Does not use albuterol  6. Gastroesophageal reflux disease without esophagitis Is on omeprazole daily and works well to keep symptoms under control.  7. Recurrent major depressive disorder, in partial remission ([place]) Has been on prozac for many years. Says she is doing well.  Depression screen [facility] 2/9 02/15/2020 10/05/2019 06/22/2019  Decreased Interest 3 3 1   Down, Depressed, Hopeless 1 1 1   PHQ - 2 Score 4 4 2   Altered sleeping 3 3 1   Tired, decreased energy 3 3 3   Change in appetite 1 1 3   Feeling bad or failure about yourself  0 0 0  Trouble concentrating 0 0 0  Moving slowly or fidgety/restless 0 0 0  Suicidal thoughts 0 0 0  PHQ-9 Score 11 11 9   Difficult doing work/chores Not difficult at all Not difficult at all Somewhat difficult  Some recent data might  be hidden     8. GAD (generalized anxiety disorder) She is on buspar daily.  GAD 7 : Generalized Anxiety Score 02/15/2020 10/05/2019 06/22/2019 02/05/2019  Nervous, Anxious, on Edge 0 0 3 3  Control/stop worrying 0 0 0 1  Worry too much - different things - 0 0 1  Trouble relaxing 0 1 3 3   Restless 0 0 1 0  Easily annoyed or irritable 0 1 1 3   Afraid - awful might happen 0 0 1 0  Total GAD 7 Score - 2 9 11   Anxiety Difficulty Not difficult at all Not difficult at all Somewhat difficult Not difficult at all     9. Primary insomnia Is on ambien to sleep. Says she cannot sleep without meds  10. Narcotic abuse ([place]) Is currently on no narcotics.  11. Stress incontinence of urine Has to sleep with bed pads to protect be. She says she has seen specialiast but does not want to have surgery  12. Morbid obesity ([place]) Weight is down 8lbs Wt Readings from Last 3 Encounters:  02/15/20 207 lb (93.9 kg)  10/05/19 215 lb (97.5 kg)  06/22/19 211 lb 6.4 oz (95.9 kg)   BMI Readings from Last 3 Encounters:  02/15/20 37.86 kg/m  10/05/19 39.32 kg/m  06/22/19 38.67 kg/m       Outpatient Encounter Medications as of 02/15/2020  Medication Sig  . amLODipine (NORVASC) 10 MG tablet Take 1 tablet (10 mg total) by mouth daily.  . Aspirin-Acetaminophen-Caffeine 520-260-32.5 MG PACK Take 1 packet by mouth 2 (two) times daily as needed. For pain or headache  . benzonatate (TESSALON PERLES) 100 MG capsule Take 1 capsule (100 mg total) by mouth 3 (three) times daily as needed.  . budesonide-formoterol (SYMBICORT) 80-4.5 MCG/ACT inhaler Inhale 2 puffs into the lungs 2 (two) times daily.  . busPIRone (BUSPAR) 7.5 MG tablet Take 1 tablet (7.5 mg total) by mouth daily.  . cyclobenzaprine (FLEXERIL) 10 MG tablet Take 1 tablet (10 mg total) by mouth 3 (three) times daily.  Marland Kitchen ezetimibe (ZETIA) 10 MG tablet Take 1 tablet (10 mg total) by mouth daily. TAKE 1 TABLET BY MOUTH ONCE DAILY  . FLUoxetine  (PROZAC) 40 MG capsule Take 1 capsule (40 mg total) by mouth 2 (two) times daily.  . hydrochlorothiazide (HYDRODIURIL) 25 MG tablet Take 1 tablet (25 mg total) by mouth daily.  Marland Kitchen ibuprofen (ADVIL,MOTRIN) 200 MG tablet Take 200 mg by mouth every 6 (six) hours as needed. Takes 800 mg when needed for menstrual cramping  . Incontinence Supplies KIT Patient needs Pull ups, chuck pads and wipes.  Marland Kitchen lisinopril (ZESTRIL) 10 MG tablet Take 1 tablet by mouth once daily  . metFORMIN (GLUCOPHAGE) 500 MG tablet Take 1 tablet (500 mg total) by mouth 2 (two) times daily with a meal.  . nicotine (NICODERM CQ - DOSED IN MG/24 HOURS) 21 mg/24hr patch APPLY ONE PATCH TOPICALLY ONCE DAILY  . nitroGLYCERIN (NITROSTAT) 0.4 MG SL tablet DISSOLVE ONE TABLET UNDER THE TONGUE EVERY 5 MINUTES AS NEEDED FOR CHEST PAIN.  DO NOT EXCEED A TOTAL OF 3 DOSES IN 15 MINUTES  . omeprazole (PRILOSEC) 40 MG capsule Take 1 capsule (40 mg total) by mouth daily.  . simvastatin (ZOCOR) 40 MG tablet Take 1 tablet (40 mg total) by mouth daily at 6 PM.  . zolpidem (AMBIEN) 10 MG tablet Take 1 tablet (10 mg total) by mouth at bedtime.  . [DISCONTINUED] blood glucose meter kit and supplies Dispense based on patient and insurance preference. Use up to four times daily as directed. (FOR ICD-10 E10.9, E11.9).     Past Surgical History:  Procedure Laterality Date  . TUBAL LIGATION      Family History  Problem Relation Age of Onset  . CAD Father 52       Died age 67  . CAD Brother 52  . CAD Mother 3    New complaints: None today  Social history: Lives with her husband  Controlled substance contract: 02/15/20    Review of Systems  Constitutional: Negative for diaphoresis.  Eyes: Negative for pain.  Respiratory: Negative for shortness of breath.   Cardiovascular: Negative for chest pain, palpitations and leg swelling.  Gastrointestinal: Negative for abdominal pain.  Endocrine: Negative for polydipsia.  Skin: Negative for  rash.  Neurological: Negative for dizziness, weakness and headaches.  Hematological: Does not bruise/bleed easily.  All other systems reviewed and are negative.      Objective:   Physical Exam Vitals and nursing note reviewed.  Constitutional:      General: She is not in acute distress.    Appearance: Normal appearance. She is well-developed and well-nourished.  HENT:     Head: Normocephalic.     Nose: Nose normal.     Mouth/Throat:     Mouth: Oropharynx is clear and moist.  Eyes:  Extraocular Movements: EOM normal.     Pupils: Pupils are equal, round, and reactive to light.  Neck:     Vascular: No carotid bruit or JVD.  Cardiovascular:     Rate and Rhythm: Normal rate and regular rhythm.     Pulses: Intact distal pulses.     Heart sounds: Normal heart sounds.  Pulmonary:     Effort: Pulmonary effort is normal. No respiratory distress.     Breath sounds: Normal breath sounds. No wheezing or rales.  Chest:     Chest wall: No tenderness.  Abdominal:     General: Bowel sounds are normal. There is no distension or abdominal bruit. Aorta is normal.     Palpations: Abdomen is soft. There is no hepatomegaly, splenomegaly, mass or pulsatile mass.     Tenderness: There is no abdominal tenderness.  Musculoskeletal:        General: No edema. Normal range of motion.     Cervical back: Normal range of motion and neck supple.  Lymphadenopathy:     Cervical: No cervical adenopathy.  Skin:    General: Skin is warm and dry.  Neurological:     Mental Status: She is alert and oriented to person, place, and time.     Deep Tendon Reflexes: Reflexes are normal and symmetric.  Psychiatric:        Mood and Affect: Mood and affect normal.        Behavior: Behavior normal.        Thought Content: Thought content normal.        Judgment: Judgment normal.    BP (!) 142/83   Pulse 83   Temp 98.3 F (36.8 C) (Temporal)   Resp 20   Ht 5\' 2"  (1.575 m)   Wt 207 lb (93.9 kg)   SpO2 95%    BMI 37.86 kg/m   hgba1c 7.6      Assessment & Plan:  Madison Gentry comes in today with chief complaint of Medical Management of Chronic Issues   Diagnosis and orders addressed:  1. Type 2 diabetes mellitus with diabetic polyneuropathy, without long-term current use of insulin (HCC) strciter carb counting - Bayer DCA Hb A1c Waived - metFORMIN (GLUCOPHAGE) 500 MG tablet; Take 1 tablet (500 mg total) by mouth 2 (two) times daily with a meal.  Dispense: 180 tablet; Refill: 1  2. Pure hypercholesterolemia Low fat diet - Lipid panel - simvastatin (ZOCOR) 40 MG tablet; Take 1 tablet (40 mg total) by mouth daily at 6 PM.  Dispense: 90 tablet; Refill: 1 - ezetimibe (ZETIA) 10 MG tablet; Take 1 tablet (10 mg total) by mouth daily. TAKE 1 TABLET BY MOUTH ONCE DAILY  Dispense: 90 tablet; Refill: 1 - lisinopril (ZESTRIL) 10 MG tablet; Take 1 tablet (10 mg total) by mouth daily.  Dispense: 90 tablet; Refill: 1 - hydrochlorothiazide (HYDRODIURIL) 25 MG tablet; Take 1 tablet (25 mg total) by mouth daily.  Dispense: 90 tablet; Refill: 1 - amLODipine (NORVASC) 10 MG tablet; Take 1 tablet (10 mg total) by mouth daily.  Dispense: 90 tablet; Refill: 1   3. Primary hypertension Low sodium diet - CBC with Differential/Platelet - CMP14+EGFR  4. Atherosclerosis of native coronary artery of native heart with angina pectoris (HCC)  5. Simple chronic bronchitis (HCC) continue daily inhaler - budesonide-formoterol (SYMBICORT) 80-4.5 MCG/ACT inhaler; Inhale 2 puffs into the lungs 2 (two) times daily.  Dispense: 10.2 g; Refill: 5  6. Gastroesophageal reflux disease without esophagitis Avoid spicy foods Do not  eat 2 hours prior to bedtime - omeprazole (PRILOSEC) 40 MG capsule; Take 1 capsule (40 mg total) by mouth daily.  Dispense: 90 capsule; Refill: 1  7. Recurrent major depressive disorder, in partial remission (HCC) Stress management - FLUoxetine (PROZAC) 40 MG capsule; Take 1 capsule (40 mg total)  by mouth 2 (two) times daily.  Dispense: 180 capsule; Refill: 1  8. GAD (generalized anxiety disorder) - busPIRone (BUSPAR) 7.5 MG tablet; Take 1 tablet (7.5 mg total) by mouth daily.  Dispense: 90 tablet; Refill: 2  9. Primary insomnia Bedtime routine - zolpidem (AMBIEN) 10 MG tablet; Take 1 tablet (10 mg total) by mouth at bedtime.  Dispense: 30 tablet; Refill: 2  10. Narcotic abuse ([place])  11. Stress incontinence of urine Continue with pads as needed Try Kegel exercises  12. Morbid obesity ([place]) Discussed diet and exercise for person with BMI >25 Will recheck weight in 3-6 months   14. Tobacco user Smoking cessation encouraged   Labs pending Health Maintenance reviewed Diet and exercise encouraged  Follow up plan: 3 months   Mary-Margaret Hassell Done, FNP

## 2020-02-15 NOTE — Patient Instructions (Signed)
Carbohydrate Counting for Diabetes Mellitus, Adult  Carbohydrate counting is a method of keeping track of how many carbohydrates you eat. Eating carbohydrates naturally increases the amount of sugar (glucose) in the blood. Counting how many carbohydrates you eat helps keep your blood glucose within normal limits, which helps you manage your diabetes (diabetes mellitus). It is important to know how many carbohydrates you can safely have in each meal. This is different for every person. A diet and nutrition specialist (registered dietitian) can help you make a meal plan and calculate how many carbohydrates you should have at each meal and snack. Carbohydrates are found in the following foods:  Grains, such as breads and cereals.  Dried beans and soy products.  Starchy vegetables, such as potatoes, peas, and corn.  Fruit and fruit juices.  Milk and yogurt.  Sweets and snack foods, such as cake, cookies, candy, chips, and soft drinks. How do I count carbohydrates? There are two ways to count carbohydrates in food. You can use either of the methods or a combination of both. Reading "Nutrition Facts" on packaged food The "Nutrition Facts" list is included on the labels of almost all packaged foods and beverages in the [place] It includes:  The serving size.  Information about nutrients in each serving, including the grams (g) of carbohydrate per serving. To use the "Nutrition Facts":  Decide how many servings you will have.  Multiply the number of servings by the number of carbohydrates per serving.  The resulting number is the total amount of carbohydrates that you will be having. Learning standard serving sizes of other foods When you eat carbohydrate foods that are not packaged or do not include "Nutrition Facts" on the label, you need to measure the servings in order to count the amount of carbohydrates:  Measure the foods that you will eat with a food scale or measuring cup, if  needed.  Decide how many standard-size servings you will eat.  Multiply the number of servings by 15. Most carbohydrate-rich foods have about 15 g of carbohydrates per serving. ? For example, if you eat 8 oz (170 g) of strawberries, you will have eaten 2 servings and 30 g of carbohydrates (2 servings x 15 g = 30 g).  For foods that have more than one food mixed, such as soups and casseroles, you must count the carbohydrates in each food that is included. The following list contains standard serving sizes of common carbohydrate-rich foods. Each of these servings has about 15 g of carbohydrates:   hamburger bun or  English muffin.   oz (15 mL) syrup.   oz (14 g) jelly.  1 slice of bread.  1 six-inch tortilla.  3 oz (85 g) cooked rice or pasta.  4 oz (113 g) cooked dried beans.  4 oz (113 g) starchy vegetable, such as peas, corn, or potatoes.  4 oz (113 g) hot cereal.  4 oz (113 g) mashed potatoes or  of a large baked potato.  4 oz (113 g) canned or frozen fruit.  4 oz (120 mL) fruit juice.  4-6 crackers.  6 chicken nuggets.  6 oz (170 g) unsweetened dry cereal.  6 oz (170 g) plain fat-free yogurt or yogurt sweetened with artificial sweeteners.  8 oz (240 mL) milk.  8 oz (170 g) fresh fruit or one small piece of fruit.  24 oz (680 g) popped popcorn. Example of carbohydrate counting Sample meal  3 oz (85 g) chicken breast.  6 oz (170 g)   brown rice.  4 oz (113 g) corn.  8 oz (240 mL) milk.  8 oz (170 g) strawberries with sugar-free whipped topping. Carbohydrate calculation 1. Identify the foods that contain carbohydrates: ? Rice. ? Corn. ? Milk. ? Strawberries. 2. Calculate how many servings you have of each food: ? 2 servings rice. ? 1 serving corn. ? 1 serving milk. ? 1 serving strawberries. 3. Multiply each number of servings by 15 g: ? 2 servings rice x 15 g = 30 g. ? 1 serving corn x 15 g = 15 g. ? 1 serving milk x 15 g = 15 g. ? 1  serving strawberries x 15 g = 15 g. 4. Add together all of the amounts to find the total grams of carbohydrates eaten: ? 30 g + 15 g + 15 g + 15 g = 75 g of carbohydrates total. Summary  Carbohydrate counting is a method of keeping track of how many carbohydrates you eat.  Eating carbohydrates naturally increases the amount of sugar (glucose) in the blood.  Counting how many carbohydrates you eat helps keep your blood glucose within normal limits, which helps you manage your diabetes.  A diet and nutrition specialist (registered dietitian) can help you make a meal plan and calculate how many carbohydrates you should have at each meal and snack. This information is not intended to replace advice given to you by your health care provider. Make sure you discuss any questions you have with your health care provider. Document Revised: 09/06/2016 Document Reviewed: 07/27/2015 Elsevier Patient Education  2020 [employer]  

## 2020-02-16 LAB — CBC WITH DIFFERENTIAL/PLATELET
Basophils Absolute: 0.1 10*3/uL (ref 0.0–0.2)
Basos: 1 %
EOS (ABSOLUTE): 0.5 10*3/uL — ABNORMAL HIGH (ref 0.0–0.4)
Eos: 4 %
Hematocrit: 42.7 % (ref 34.0–46.6)
Hemoglobin: 14.2 g/dL (ref 11.1–15.9)
Immature Grans (Abs): 0 10*3/uL (ref 0.0–0.1)
Immature Granulocytes: 0 %
Lymphocytes Absolute: 2.7 10*3/uL (ref 0.7–3.1)
Lymphs: 22 %
MCH: 28.6 pg (ref 26.6–33.0)
MCHC: 33.3 g/dL (ref 31.5–35.7)
MCV: 86 fL (ref 79–97)
Monocytes Absolute: 0.7 10*3/uL (ref 0.1–0.9)
Monocytes: 6 %
Neutrophils Absolute: 8.2 10*3/uL — ABNORMAL HIGH (ref 1.4–7.0)
Neutrophils: 67 %
Platelets: 402 10*3/uL (ref 150–450)
RBC: 4.96 x10E6/uL (ref 3.77–5.28)
RDW: 13.1 % (ref 11.7–15.4)
WBC: 12.3 10*3/uL — ABNORMAL HIGH (ref 3.4–10.8)

## 2020-02-16 LAB — CMP14+EGFR
ALT: 34 IU/L — ABNORMAL HIGH (ref 0–32)
AST: 21 IU/L (ref 0–40)
Albumin/Globulin Ratio: 1.9 (ref 1.2–2.2)
Albumin: 4.2 g/dL (ref 3.8–4.9)
Alkaline Phosphatase: 73 IU/L (ref 44–121)
BUN/Creatinine Ratio: 17 (ref 9–23)
BUN: 8 mg/dL (ref 6–24)
Bilirubin Total: <0.2 mg/dL (ref 0.0–1.2)
CO2: 28 mmol/L (ref 20–29)
Calcium: 9.7 mg/dL (ref 8.7–10.2)
Chloride: 99 mmol/L (ref 96–106)
Creatinine, Ser: 0.46 mg/dL — ABNORMAL LOW (ref 0.57–1.00)
GFR calc Af Amer: 132 mL/min/{1.73_m2} (ref >59)
GFR calc non Af Amer: 115 mL/min/{1.73_m2} (ref >59)
Globulin, Total: 2.2 g/dL (ref 1.5–4.5)
Glucose: 133 mg/dL — ABNORMAL HIGH (ref 65–99)
Potassium: 4.2 mmol/L (ref 3.5–5.2)
Sodium: 141 mmol/L (ref 134–144)
Total Protein: 6.4 g/dL (ref 6.0–8.5)

## 2020-02-16 LAB — LIPID PANEL
Chol/HDL Ratio: 2.6 ratio (ref 0.0–4.4)
Cholesterol, Total: 135 mg/dL (ref 100–199)
HDL: 51 mg/dL (ref >39)
LDL Chol Calc (NIH): 64 mg/dL (ref 0–99)
Triglycerides: 109 mg/dL (ref 0–149)
VLDL Cholesterol Cal: 20 mg/dL (ref 5–40)

## 2020-03-07 ENCOUNTER — Ambulatory Visit: Payer: Medicaid Other | Admitting: Family Medicine

## 2020-03-07 ENCOUNTER — Ambulatory Visit: Payer: Medicaid Other

## 2020-03-23 ENCOUNTER — Telehealth: Payer: Self-pay | Admitting: *Deleted

## 2020-03-23 DIAGNOSIS — J449 Chronic obstructive pulmonary disease, unspecified: Secondary | ICD-10-CM

## 2020-03-23 DIAGNOSIS — J410 Simple chronic bronchitis: Secondary | ICD-10-CM

## 2020-03-23 NOTE — Telephone Encounter (Signed)
PA in process for budesonide-formoterol 80-4.5 MCG   Key [id number] - Rx #: [id number]

## 2020-03-24 MED ORDER — SYMBICORT 80-4.5 MCG/ACT IN AERO: 2.0000 | INHALATION_SPRAY | Freq: Two times a day (BID) | RESPIRATORY_TRACT | 3 refills | Status: DC

## 2020-03-24 NOTE — Telephone Encounter (Signed)
We received a prior authorization request for the member and product listed above. The [facility] and [facility] Prior Authorization Team is not able to review this request because the brand name for the requested product is preferred on the formulary and is covered without prior authorization requirements  Name brand sent in for pt

## 2020-03-24 NOTE — Telephone Encounter (Signed)
Question response answered - symbicort is preferred and still requiring PA   Re-sent to plan

## 2020-05-06 ENCOUNTER — Other Ambulatory Visit: Payer: Self-pay | Admitting: Nurse Practitioner

## 2020-05-06 DIAGNOSIS — E785 Hyperlipidemia, unspecified: Secondary | ICD-10-CM

## 2020-05-16 ENCOUNTER — Ambulatory Visit: Payer: Self-pay | Admitting: Nurse Practitioner

## 2020-05-16 ENCOUNTER — Other Ambulatory Visit: Payer: Self-pay | Admitting: Nurse Practitioner

## 2020-05-16 DIAGNOSIS — F5101 Primary insomnia: Secondary | ICD-10-CM

## 2020-05-16 NOTE — Telephone Encounter (Signed)
Pt informed refill request is on a controlled medication, she has a contract signed, this will have to wait till her appt on Thursday

## 2020-05-16 NOTE — Telephone Encounter (Signed)
  Prescription Request  05/16/2020  What is the name of the medication or equipment? Ambien   Have you contacted your pharmacy to request a refill? (if applicable) yes  Which pharmacy would you like this sent to? [facility] in [place]   Patient notified that their request is being sent to the clinical staff for review and that they should receive a response within 2 business days.

## 2020-05-19 ENCOUNTER — Other Ambulatory Visit: Payer: Self-pay

## 2020-05-19 ENCOUNTER — Encounter: Payer: Self-pay | Admitting: Nurse Practitioner

## 2020-05-19 ENCOUNTER — Ambulatory Visit (INDEPENDENT_AMBULATORY_CARE_PROVIDER_SITE_OTHER): Payer: Medicaid Other | Admitting: Nurse Practitioner

## 2020-05-19 VITALS — BP 110/67 | HR 73 | Temp 98.3°F | Resp 20 | Ht 62.0 in | Wt 210.0 lb

## 2020-05-19 DIAGNOSIS — E1142 Type 2 diabetes mellitus with diabetic polyneuropathy: Secondary | ICD-10-CM

## 2020-05-19 DIAGNOSIS — J449 Chronic obstructive pulmonary disease, unspecified: Secondary | ICD-10-CM | POA: Diagnosis not present

## 2020-05-19 DIAGNOSIS — F3341 Major depressive disorder, recurrent, in partial remission: Secondary | ICD-10-CM

## 2020-05-19 DIAGNOSIS — N393 Stress incontinence (female) (male): Secondary | ICD-10-CM

## 2020-05-19 DIAGNOSIS — I25119 Atherosclerotic heart disease of native coronary artery with unspecified angina pectoris: Secondary | ICD-10-CM | POA: Diagnosis not present

## 2020-05-19 DIAGNOSIS — R079 Chest pain, unspecified: Secondary | ICD-10-CM

## 2020-05-19 DIAGNOSIS — F411 Generalized anxiety disorder: Secondary | ICD-10-CM

## 2020-05-19 DIAGNOSIS — I10 Essential (primary) hypertension: Secondary | ICD-10-CM | POA: Diagnosis not present

## 2020-05-19 DIAGNOSIS — E7800 Pure hypercholesterolemia, unspecified: Secondary | ICD-10-CM | POA: Diagnosis not present

## 2020-05-19 DIAGNOSIS — M545 Low back pain, unspecified: Secondary | ICD-10-CM

## 2020-05-19 DIAGNOSIS — G8929 Other chronic pain: Secondary | ICD-10-CM

## 2020-05-19 DIAGNOSIS — K219 Gastro-esophageal reflux disease without esophagitis: Secondary | ICD-10-CM

## 2020-05-19 DIAGNOSIS — F5101 Primary insomnia: Secondary | ICD-10-CM

## 2020-05-19 LAB — BAYER DCA HB A1C WAIVED: HB A1C (BAYER DCA - WAIVED): 6.8 % (ref <7.0)

## 2020-05-19 MED ORDER — ZOLPIDEM TARTRATE 10 MG PO TABS
10.0000 mg | ORAL_TABLET | Freq: Every day | ORAL | 2 refills | Status: DC
Start: 2020-05-19 — End: 2020-08-25

## 2020-05-19 MED ORDER — BUSPIRONE HCL 7.5 MG PO TABS
7.5000 mg | ORAL_TABLET | Freq: Every day | ORAL | 2 refills | Status: DC
Start: 2020-05-19 — End: 2020-08-25

## 2020-05-19 NOTE — Patient Instructions (Signed)

## 2020-05-19 NOTE — Progress Notes (Signed)
 Subjective:    Patient ID: Madison Gentry, female    DOB: 05/23/1965, 54 y.o.   MRN: [id number]   Chief Complaint: medical management of chronic issues     HPI:  1. Primary hypertension No c/o , sob or headache. She does not check her blood pressure at home. Had some chest pain and had to take nitro a couple of weeks ago. Has not occurred since. BP Readings from Last 3 Encounters:  02/15/20 (!) 142/83  10/05/19 119/70  06/22/19 126/77     2. Atherosclerosis of native coronary artery of native heart with angina pectoris (HCC) Has not seen cardiology in quite some time.   3. Pure hypercholesterolemia Does not watch diet and does no exercise. Lab Results  Component Value Date   CHOL 135 02/15/2020   HDL 51 02/15/2020   LDLCALC 64 02/15/2020   TRIG 109 02/15/2020   CHOLHDL 2.6 02/15/2020   The 10-year ASCVD risk score (Goff DC Jr., et al., 2013) is: 8.4%   4. Chronic obstructive pulmonary disease, unspecified COPD type (HCC) Again denies any sob. She is still smoking over a pack a day.  5. Type 2 diabetes mellitus with diabetic polyneuropathy, without long-term current use of insulin (HCC) She has not been checking her blood sugars at home. She has not been watching her diet very closely the last several months. Lab Results  Component Value Date   HGBA1C 7.6 (H) 02/15/2020     6. Gastroesophageal reflux disease without esophagitis Is on omeprazole daily and is doing well.  7. Recurrent major depressive disorder, in partial remission (HCC) She has  Been on prozac or several years and is doing well. Depression screen PHQ 2/9 05/19/2020 02/15/2020 10/05/2019  Decreased Interest 1 3 3  Down, Depressed, Hopeless 1 1 1  PHQ - 2 Score 2 4 4  Altered sleeping 1 3 3  Tired, decreased energy 1 3 3  Change in appetite 1 1 1  Feeling bad or failure about yourself  0 0 0  Trouble concentrating 0 0 0  Moving slowly or fidgety/restless 0 0 0  Suicidal thoughts 0 0 0  PHQ-9 Score  5 11 11  Difficult doing work/chores Not difficult at all Not difficult at all Not difficult at all  Some recent data might be hidden    8. GAD (generalized anxiety disorder) Is on buspar as needed. She stays nervous and anxious al the time. GAD 7 : Generalized Anxiety Score 05/19/2020 02/15/2020 10/05/2019 06/22/2019  Nervous, Anxious, on Edge 3 0 0 3  Control/stop worrying 1 0 0 0  Worry too much - different things 1 - 0 0  Trouble relaxing 1 0 1 3  Restless 1 0 0 1  Easily annoyed or irritable 1 0 1 1  Afraid - awful might happen 0 0 0 1  Total GAD 7 Score 8 - 2 9  Anxiety Difficulty Somewhat difficult Not difficult at all Not difficult at all Somewhat difficult      9. Stress incontinence of urine Has to wear depends al the time.  10. Chronic midline low back pain without sciatica Sees pain management for her pain medication   11. Morbid obesity (HCC) Weight is up 4 lbs since last visit. Wt Readings from Last 3 Encounters:  05/19/20 210 lb (95.3 kg)  02/15/20 207 lb (93.9 kg)  10/05/19 215 lb (97.5 kg)   BMI Readings from Last 3 Encounters:  05/19/20 38.41 kg/m  02/15/20 37.86 kg/m  10/05/19   39.32 kg/m      Outpatient Encounter Medications as of 05/19/2020  Medication Sig  . amLODipine (NORVASC) 10 MG tablet Take 1 tablet (10 mg total) by mouth daily.  . Aspirin-Acetaminophen-Caffeine 520-260-32.5 MG PACK Take 1 packet by mouth 2 (two) times daily as needed. For pain or headache  . benzonatate (TESSALON PERLES) 100 MG capsule Take 1 capsule (100 mg total) by mouth 3 (three) times daily as needed.  . busPIRone (BUSPAR) 7.5 MG tablet Take 1 tablet (7.5 mg total) by mouth daily.  . cyclobenzaprine (FLEXERIL) 10 MG tablet Take 1 tablet (10 mg total) by mouth 3 (three) times daily.  Marland Kitchen ezetimibe (ZETIA) 10 MG tablet Take 1 tablet (10 mg total) by mouth daily. TAKE 1 TABLET BY MOUTH ONCE DAILY  . FLUoxetine (PROZAC) 40 MG capsule Take 1 capsule (40 mg total) by mouth 2  (two) times daily.  . hydrochlorothiazide (HYDRODIURIL) 25 MG tablet Take 1 tablet (25 mg total) by mouth daily.  Marland Kitchen ibuprofen (ADVIL,MOTRIN) 200 MG tablet Take 200 mg by mouth every 6 (six) hours as needed. Takes 800 mg when needed for menstrual cramping  . Incontinence Supplies KIT Patient needs Pull ups, chuck pads and wipes.  Marland Kitchen lisinopril (ZESTRIL) 10 MG tablet Take 1 tablet (10 mg total) by mouth daily.  . metFORMIN (GLUCOPHAGE) 500 MG tablet Take 1 tablet (500 mg total) by mouth 2 (two) times daily with a meal.  . nicotine (NICODERM CQ - DOSED IN MG/24 HOURS) 21 mg/24hr patch APPLY ONE PATCH TOPICALLY ONCE DAILY  . nitroGLYCERIN (NITROSTAT) 0.4 MG SL tablet DISSOLVE ONE TABLET UNDER THE TONGUE EVERY 5 MINUTES AS NEEDED FOR CHEST PAIN.  DO NOT EXCEED A TOTAL OF 3 DOSES IN 15 MINUTES  . omeprazole (PRILOSEC) 40 MG capsule Take 1 capsule (40 mg total) by mouth daily.  . simvastatin (ZOCOR) 40 MG tablet Take 1 tablet (40 mg total) by mouth daily at 6 PM.  . SYMBICORT 80-4.5 MCG/ACT inhaler Inhale 2 puffs into the lungs 2 (two) times daily.  Marland Kitchen zolpidem (AMBIEN) 10 MG tablet Take 1 tablet (10 mg total) by mouth at bedtime.   No facility-administered encounter medications on file as of 05/19/2020.    Past Surgical History:  Procedure Laterality Date  . TUBAL LIGATION      Family History  Problem Relation Age of Onset  . CAD Father 86       Died age 5  . CAD Brother 75  . CAD Mother 42    New complaints: None today  Social history: Lives with husband and helps keep her grandkids  Controlled substance contract: 02/15/21    Review of Systems  Constitutional: Negative for diaphoresis.  Eyes: Negative for pain.  Respiratory: Negative for shortness of breath.   Cardiovascular: Negative for chest pain, palpitations and leg swelling.  Gastrointestinal: Negative for abdominal pain.  Endocrine: Negative for polydipsia.  Skin: Negative for rash.  Neurological: Negative for  dizziness, weakness and headaches.  Hematological: Does not bruise/bleed easily.  All other systems reviewed and are negative.      Objective:   Physical Exam Vitals and nursing note reviewed.  Constitutional:      General: She is not in acute distress.    Appearance: Normal appearance. She is well-developed.  HENT:     Head: Normocephalic.     Nose: Nose normal.  Eyes:     Pupils: Pupils are equal, round, and reactive to light.  Neck:     Vascular:  No carotid bruit or JVD.  Cardiovascular:     Rate and Rhythm: Normal rate and regular rhythm.     Heart sounds: Normal heart sounds.  Pulmonary:     Effort: Pulmonary effort is normal. No respiratory distress.     Breath sounds: Normal breath sounds. No wheezing or rales.  Chest:     Chest wall: No tenderness.  Abdominal:     General: Bowel sounds are normal. There is no distension or abdominal bruit.     Palpations: Abdomen is soft. There is no hepatomegaly, splenomegaly, mass or pulsatile mass.     Tenderness: There is no abdominal tenderness.  Musculoskeletal:        General: Normal range of motion.     Cervical back: Normal range of motion and neck supple.  Lymphadenopathy:     Cervical: No cervical adenopathy.  Skin:    General: Skin is warm and dry.  Neurological:     Mental Status: She is alert and oriented to person, place, and time.     Deep Tendon Reflexes: Reflexes are normal and symmetric.  Psychiatric:        Behavior: Behavior normal.        Thought Content: Thought content normal.        Judgment: Judgment normal.    BP 110/67   Pulse 73   Temp 98.3 F (36.8 C) (Temporal)   Resp 20   Ht 5' 2" (1.575 m)   Wt 210 lb (95.3 kg)   SpO2 92%   BMI 38.41 kg/m   EKG-NSR-Preliminary reading by Ronnald Collum, FNP  [facility]   hgba1c 6.8%     Assessment & Plan:  Tamala Manzer comes in today with chief complaint of Medical Management of Chronic Issues   Diagnosis and orders addressed:  1. Primary  hypertension Low sodium diet - CBC with Differential/Platelet - CMP14+EGFR  2. Atherosclerosis of native coronary artery of native heart with angina pectoris ([place]) Needs to see cardiology- referral made  3. Pure hypercholesterolemia Low fat diet - Lipid panel  4. Chronic obstructive pulmonary disease, unspecified COPD type ([place]) Smoking cessation encouraged  5. Type 2 diabetes mellitus with diabetic polyneuropathy, without long-term current use of insulin ([place]) caobntinue  To watch cabs in diet - Bayer DCA Hb A1c Waived  6. Gastroesophageal reflux disease without esophagitis Avoid spicy foods Do not eat 2 hours prior to bedtime  7. Recurrent major depressive disorder, in partial remission ([place]) Stress management  8. GAD (generalized anxiety disorder) Stress management - busPIRone (BUSPAR) 7.5 MG tablet; Take 1 tablet (7.5 mg total) by mouth daily.  Dispense: 90 tablet; Refill: 2  9. Stress incontinence of urine  10. Chronic midline low back pain without sciatica Keep follow up with pain management  11. Morbid obesity ([place]) Discussed diet and exercise for person with BMI >25 Will recheck weight in 3-6 months  12. Chest pain, unspecified type If has to take ore then to nitroglycerin for an episode- need to go to the ED - EKG 12-Lead - Ambulatory referral to Cardiology  13. Primary insomnia Bedtime routine - zolpidem (AMBIEN) 10 MG tablet; Take 1 tablet (10 mg total) by mouth at bedtime.  Dispense: 30 tablet; Refill: 2   Labs pending Health Maintenance reviewed Diet and exercise encouraged  Follow up plan: 3 months   Mary-Margaret Hassell Done, FNP

## 2020-05-20 LAB — CBC WITH DIFFERENTIAL/PLATELET
Basophils Absolute: 0.2 10*3/uL (ref 0.0–0.2)
Basos: 1 %
EOS (ABSOLUTE): 1.1 10*3/uL — ABNORMAL HIGH (ref 0.0–0.4)
Eos: 10 %
Hematocrit: 40.2 % (ref 34.0–46.6)
Hemoglobin: 13.3 g/dL (ref 11.1–15.9)
Immature Grans (Abs): 0 10*3/uL (ref 0.0–0.1)
Immature Granulocytes: 0 %
Lymphocytes Absolute: 3 10*3/uL (ref 0.7–3.1)
Lymphs: 27 %
MCH: 29.3 pg (ref 26.6–33.0)
MCHC: 33.1 g/dL (ref 31.5–35.7)
MCV: 89 fL (ref 79–97)
Monocytes Absolute: 0.8 10*3/uL (ref 0.1–0.9)
Monocytes: 7 %
Neutrophils Absolute: 6.2 10*3/uL (ref 1.4–7.0)
Neutrophils: 55 %
Platelets: 377 10*3/uL (ref 150–450)
RBC: 4.54 x10E6/uL (ref 3.77–5.28)
RDW: 12.9 % (ref 11.7–15.4)
WBC: 11.4 10*3/uL — ABNORMAL HIGH (ref 3.4–10.8)

## 2020-05-20 LAB — CMP14+EGFR
ALT: 26 IU/L (ref 0–32)
AST: 20 IU/L (ref 0–40)
Albumin/Globulin Ratio: 2 (ref 1.2–2.2)
Albumin: 4 g/dL (ref 3.8–4.9)
Alkaline Phosphatase: 69 IU/L (ref 44–121)
BUN/Creatinine Ratio: 25 — ABNORMAL HIGH (ref 9–23)
BUN: 13 mg/dL (ref 6–24)
Bilirubin Total: <0.2 mg/dL (ref 0.0–1.2)
CO2: 25 mmol/L (ref 20–29)
Calcium: 9.6 mg/dL (ref 8.7–10.2)
Chloride: 98 mmol/L (ref 96–106)
Creatinine, Ser: 0.53 mg/dL — ABNORMAL LOW (ref 0.57–1.00)
Globulin, Total: 2 g/dL (ref 1.5–4.5)
Glucose: 111 mg/dL — ABNORMAL HIGH (ref 65–99)
Potassium: 4.7 mmol/L (ref 3.5–5.2)
Sodium: 138 mmol/L (ref 134–144)
Total Protein: 6 g/dL (ref 6.0–8.5)
eGFR: 111 mL/min/{1.73_m2} (ref >59)

## 2020-05-20 LAB — LIPID PANEL
Chol/HDL Ratio: 2.7 ratio (ref 0.0–4.4)
Cholesterol, Total: 122 mg/dL (ref 100–199)
HDL: 45 mg/dL (ref >39)
LDL Chol Calc (NIH): 58 mg/dL (ref 0–99)
Triglycerides: 99 mg/dL (ref 0–149)
VLDL Cholesterol Cal: 19 mg/dL (ref 5–40)

## 2020-07-31 NOTE — Progress Notes (Deleted)
Cardiology Office Note   Date:  07/31/2020   ID:  Madison Gentry, DOB 20-Mar-1965, MRN [id number]  PCP:  Chevis Pretty, FNP  Cardiologist:   None Referring:  ***  No chief complaint on file.     History of Present Illness: Madison Gentry is a 55 y.o. female who presents for ***    She had a negative perfusoin study in 2014.   Cath in 2008 demonstrated moderate CAD.  This demonstrated 50% LAD stenosis and an 80% ostial diagonal stenosis.   I have not seen her for several years.  ***   Past Medical History:  Diagnosis Date  . CAD (coronary artery disease)   . COPD (chronic obstructive pulmonary disease) ([place])   . Diabetes ([place])   . Hypertension   . Stroke [facility])     Past Surgical History:  Procedure Laterality Date  . TUBAL LIGATION       Current Outpatient Medications  Medication Sig Dispense Refill  . amLODipine (NORVASC) 10 MG tablet Take 1 tablet (10 mg total) by mouth daily. 90 tablet 1  . Aspirin-Acetaminophen-Caffeine 520-260-32.5 MG PACK Take 1 packet by mouth 2 (two) times daily as needed. For pain or headache    . benzonatate (TESSALON PERLES) 100 MG capsule Take 1 capsule (100 mg total) by mouth 3 (three) times daily as needed. 20 capsule 0  . busPIRone (BUSPAR) 7.5 MG tablet Take 1 tablet (7.5 mg total) by mouth daily. 90 tablet 2  . cyclobenzaprine (FLEXERIL) 10 MG tablet Take 1 tablet (10 mg total) by mouth 3 (three) times daily. 20 tablet 0  . ezetimibe (ZETIA) 10 MG tablet Take 1 tablet (10 mg total) by mouth daily. TAKE 1 TABLET BY MOUTH ONCE DAILY 90 tablet 1  . FLUoxetine (PROZAC) 40 MG capsule Take 1 capsule (40 mg total) by mouth 2 (two) times daily. 180 capsule 1  . hydrochlorothiazide (HYDRODIURIL) 25 MG tablet Take 1 tablet (25 mg total) by mouth daily. 90 tablet 1  . ibuprofen (ADVIL,MOTRIN) 200 MG tablet Take 200 mg by mouth every 6 (six) hours as needed. Takes 800 mg when needed for menstrual cramping    . Incontinence Supplies KIT Patient needs  Pull ups, chuck pads and wipes. 1 kit 0  . lisinopril (ZESTRIL) 10 MG tablet Take 1 tablet (10 mg total) by mouth daily. 90 tablet 1  . metFORMIN (GLUCOPHAGE) 500 MG tablet Take 1 tablet (500 mg total) by mouth 2 (two) times daily with a meal. 180 tablet 1  . nicotine (NICODERM CQ - DOSED IN MG/24 HOURS) 21 mg/24hr patch APPLY ONE PATCH TOPICALLY ONCE DAILY (Patient not taking: Reported on 05/19/2020) 28 patch 2  . nitroGLYCERIN (NITROSTAT) 0.4 MG SL tablet DISSOLVE ONE TABLET UNDER THE TONGUE EVERY 5 MINUTES AS NEEDED FOR CHEST PAIN.  DO NOT EXCEED A TOTAL OF 3 DOSES IN 15 MINUTES 25 tablet 0  . omeprazole (PRILOSEC) 40 MG capsule Take 1 capsule (40 mg total) by mouth daily. 90 capsule 1  . simvastatin (ZOCOR) 40 MG tablet Take 1 tablet (40 mg total) by mouth daily at 6 PM. 90 tablet 1  . SYMBICORT 80-4.5 MCG/ACT inhaler Inhale 2 puffs into the lungs 2 (two) times daily. 10.2 g 3  . zolpidem (AMBIEN) 10 MG tablet Take 1 tablet (10 mg total) by mouth at bedtime. 30 tablet 2   No current facility-administered medications for this visit.    Allergies:   Patient has no known allergies.  Social History:  The patient  reports that she has been smoking cigarettes. She has a 42.00 pack-year smoking history. She has never used smokeless tobacco. She reports current alcohol use. She reports that she does not use drugs.   Family History:  The patient's ***family history includes CAD (age of onset: 37) in her brother; CAD (age of onset: 8) in her father; CAD (age of onset: 26) in her mother.    ROS:  Please see the history of present illness.   Otherwise, review of systems are positive for {NONE DEFAULTED:18576::"none"}.   All other systems are reviewed and negative.    PHYSICAL EXAM: VS:  There were no vitals taken for this visit. , BMI There is no height or weight on file to calculate BMI. GENERAL:  Well appearing HEENT:  Pupils equal round and reactive, fundi not visualized, oral mucosa  unremarkable NECK:  No jugular venous distention, waveform within normal limits, carotid upstroke brisk and symmetric, no bruits, no thyromegaly LYMPHATICS:  No cervical, inguinal adenopathy LUNGS:  Clear to auscultation bilaterally BACK:  No CVA tenderness CHEST:  Unremarkable HEART:  PMI not displaced or sustained,S1 and S2 within normal limits, no S3, no S4, no clicks, no rubs, *** murmurs ABD:  Flat, positive bowel sounds normal in frequency in pitch, no bruits, no rebound, no guarding, no midline pulsatile mass, no hepatomegaly, no splenomegaly EXT:  2 plus pulses throughout, no edema, no cyanosis no clubbing SKIN:  No rashes no nodules NEURO:  Cranial nerves II through XII grossly intact, motor grossly intact throughout PSYCH:  Cognitively intact, oriented to person place and time    EKG:  EKG {ACTION; IS/IS FYB:[id number]} ordered today. The ekg ordered today demonstrates ***   Recent Labs: 05/19/2020: ALT 26; BUN 13; Creatinine, Ser 0.53; Hemoglobin 13.3; Platelets 377; Potassium 4.7; Sodium 138    Lipid Panel    Component Value Date/Time   CHOL 122 05/19/2020 0924   CHOL 178 05/12/2012 1607   TRIG 99 05/19/2020 0924   TRIG 139 06/02/2014 1505   TRIG 120 05/12/2012 1607   HDL 45 05/19/2020 0924   HDL 39 (L) 06/02/2014 1505   HDL 37 (L) 05/12/2012 1607   CHOLHDL 2.7 05/19/2020 0924   LDLCALC 58 05/19/2020 0924   LDLCALC 80 06/15/2013 1108   LDLCALC 117 (H) 05/12/2012 1607      Wt Readings from Last 3 Encounters:  05/19/20 210 lb (95.3 kg)  02/15/20 207 lb (93.9 kg)  10/05/19 215 lb (97.5 kg)      Other studies Reviewed: Additional studies/ records that were reviewed today include: ***. Review of the above records demonstrates:  Please see elsewhere in the note.  ***   ASSESSMENT AND PLAN:  CAD:  ***  OBESITY:  ***  TOBACCO ABUSE:  ***  DM:  ***  DYSLIPIDEMIA:  ***    Current medicines are reviewed at length with the patient today.  The patient  {ACTIONS; HAS/DOES NOT HAVE:19233} concerns regarding medicines.  The following changes have been made:  {PLAN; NO CHANGE:13088:s}  Labs/ tests ordered today include: *** No orders of the defined types were placed in this encounter.    Disposition:   FU with ***    Signed, Minus Breeding, MD  07/31/2020 10:09 AM    [place]

## 2020-08-01 ENCOUNTER — Ambulatory Visit: Payer: Medicaid Other | Admitting: Cardiology

## 2020-08-01 DIAGNOSIS — E118 Type 2 diabetes mellitus with unspecified complications: Secondary | ICD-10-CM

## 2020-08-01 DIAGNOSIS — Z720 Tobacco use: Secondary | ICD-10-CM

## 2020-08-01 DIAGNOSIS — I2510 Atherosclerotic heart disease of native coronary artery without angina pectoris: Secondary | ICD-10-CM

## 2020-08-01 DIAGNOSIS — E785 Hyperlipidemia, unspecified: Secondary | ICD-10-CM

## 2020-08-04 ENCOUNTER — Ambulatory Visit: Payer: Medicaid Other | Admitting: Family Medicine

## 2020-08-04 ENCOUNTER — Encounter: Payer: Self-pay | Admitting: Family Medicine

## 2020-08-04 ENCOUNTER — Other Ambulatory Visit: Payer: Self-pay

## 2020-08-04 VITALS — BP 123/64 | HR 96 | Temp 97.4°F | Ht 62.0 in | Wt 207.5 lb

## 2020-08-04 DIAGNOSIS — N3289 Other specified disorders of bladder: Secondary | ICD-10-CM | POA: Diagnosis not present

## 2020-08-04 DIAGNOSIS — R300 Dysuria: Secondary | ICD-10-CM

## 2020-08-04 LAB — URINALYSIS, ROUTINE W REFLEX MICROSCOPIC
Bilirubin, UA: NEGATIVE
Ketones, UA: NEGATIVE
Leukocytes,UA: NEGATIVE
Nitrite, UA: NEGATIVE
Protein,UA: NEGATIVE
RBC, UA: NEGATIVE
Specific Gravity, UA: 1.015 (ref 1.005–1.030)
Urobilinogen, Ur: 0.2 mg/dL (ref 0.2–1.0)
pH, UA: 5.5 (ref 5.0–7.5)

## 2020-08-04 LAB — MICROSCOPIC EXAMINATION
Bacteria, UA: NONE SEEN
RBC, Urine: NONE SEEN /hpf (ref 0–2)
WBC, UA: NONE SEEN /hpf (ref 0–5)

## 2020-08-04 MED ORDER — PHENAZOPYRIDINE HCL 100 MG PO TABS
100.0000 mg | ORAL_TABLET | Freq: Three times a day (TID) | ORAL | 0 refills | Status: DC | PRN
Start: 2020-08-04 — End: 2020-08-25

## 2020-08-04 NOTE — Patient Instructions (Signed)

## 2020-08-04 NOTE — Progress Notes (Signed)
Acute Office Visit  Subjective:    Patient ID: Madison Gentry, female    DOB: 08-02-1965, 55 y.o.   MRN: [id number]  Chief Complaint  Patient presents with   Dysuria    HPI Patient is in today for dysuria and lower abdominal pain x 1 week. Also reports frequency and urgency. She has been having some low back pain as well. She denies nausea, vomiting, fever, chills, or blood in urine. She has tried some cranberry juice with relief for a few days. There is a history of UTIs. She has been staying well hydrated.   Past Medical History:  Diagnosis Date   CAD (coronary artery disease)    COPD (chronic obstructive pulmonary disease) ([place])    Diabetes ([place])    Hypertension    Stroke [facility])     Past Surgical History:  Procedure Laterality Date   TUBAL LIGATION      Family History  Problem Relation Age of Onset   CAD Father 60       Died age 89   CAD Brother 9   CAD Mother 85    Social History   Socioeconomic History   Marital status: Single    Spouse name: Not on file   Number of children: 3   Years of education: Not on file   Highest education level: Not on file  Occupational History   Not on file  Tobacco Use   Smoking status: Every Day    Packs/day: 1.50    Years: 28.00    Pack years: 42.00    Types: Cigarettes   Smokeless tobacco: Never  Substance and Sexual Activity   Alcohol use: Yes    Comment: occas   Drug use: No   Sexual activity: Yes    Birth control/protection: None  Other Topics Concern   Not on file  Social History Narrative   Lives at home with the father of her children.     Social Determinants of Health   Financial Resource Strain: Not on file  Food Insecurity: Not on file  Transportation Needs: Not on file  Physical Activity: Not on file  Stress: Not on file  Social Connections: Not on file  Intimate Partner Violence: Not on file    Outpatient Medications Prior to Visit  Medication Sig Dispense Refill   amLODipine (NORVASC) 10 MG  tablet Take 1 tablet (10 mg total) by mouth daily. 90 tablet 1   Aspirin-Acetaminophen-Caffeine 520-260-32.5 MG PACK Take 1 packet by mouth 2 (two) times daily as needed. For pain or headache     busPIRone (BUSPAR) 7.5 MG tablet Take 1 tablet (7.5 mg total) by mouth daily. 90 tablet 2   cyclobenzaprine (FLEXERIL) 10 MG tablet Take 1 tablet (10 mg total) by mouth 3 (three) times daily. 20 tablet 0   ezetimibe (ZETIA) 10 MG tablet Take 1 tablet (10 mg total) by mouth daily. TAKE 1 TABLET BY MOUTH ONCE DAILY 90 tablet 1   FLUoxetine (PROZAC) 40 MG capsule Take 1 capsule (40 mg total) by mouth 2 (two) times daily. 180 capsule 1   hydrochlorothiazide (HYDRODIURIL) 25 MG tablet Take 1 tablet (25 mg total) by mouth daily. 90 tablet 1   HYDROcodone-acetaminophen (NORCO) 10-325 MG tablet Take 1 tablet by mouth 4 (four) times daily as needed.     ibuprofen (ADVIL,MOTRIN) 200 MG tablet Take 200 mg by mouth every 6 (six) hours as needed. Takes 800 mg when needed for menstrual cramping     Incontinence Supplies  KIT Patient needs Pull ups, chuck pads and wipes. 1 kit 0   lisinopril (ZESTRIL) 10 MG tablet Take 1 tablet (10 mg total) by mouth daily. 90 tablet 1   metFORMIN (GLUCOPHAGE) 500 MG tablet Take 1 tablet (500 mg total) by mouth 2 (two) times daily with a meal. 180 tablet 1   nicotine (NICODERM CQ - DOSED IN MG/24 HOURS) 21 mg/24hr patch APPLY ONE PATCH TOPICALLY ONCE DAILY 28 patch 2   nitroGLYCERIN (NITROSTAT) 0.4 MG SL tablet DISSOLVE ONE TABLET UNDER THE TONGUE EVERY 5 MINUTES AS NEEDED FOR CHEST PAIN.  DO NOT EXCEED A TOTAL OF 3 DOSES IN 15 MINUTES 25 tablet 0   omeprazole (PRILOSEC) 40 MG capsule Take 1 capsule (40 mg total) by mouth daily. 90 capsule 1   simvastatin (ZOCOR) 40 MG tablet Take 1 tablet (40 mg total) by mouth daily at 6 PM. 90 tablet 1   SYMBICORT 80-4.5 MCG/ACT inhaler Inhale 2 puffs into the lungs 2 (two) times daily. 10.2 g 3   zolpidem (AMBIEN) 10 MG tablet Take 1 tablet (10 mg  total) by mouth at bedtime. 30 tablet 2   benzonatate (TESSALON PERLES) 100 MG capsule Take 1 capsule (100 mg total) by mouth 3 (three) times daily as needed. 20 capsule 0   No facility-administered medications prior to visit.    No Known Allergies  Review of Systems As per HPI.     Objective:    Physical Exam Vitals and nursing note reviewed.  Constitutional:      General: She is not in acute distress.    Appearance: She is not ill-appearing, toxic-appearing or diaphoretic.  Cardiovascular:     Rate and Rhythm: Normal rate and regular rhythm.     Heart sounds: Normal heart sounds. No murmur heard. Pulmonary:     Effort: Pulmonary effort is normal. No respiratory distress.     Breath sounds: No wheezing or rales.  Abdominal:     General: Bowel sounds are normal. There is no distension.     Palpations: Abdomen is soft.     Tenderness: There is no abdominal tenderness. There is no right CVA tenderness, left CVA tenderness, guarding or rebound.  Skin:    General: Skin is warm and dry.  Neurological:     General: No focal deficit present.     Mental Status: She is alert and oriented to person, place, and time.  Psychiatric:        Mood and Affect: Mood normal.        Behavior: Behavior normal.    BP 123/64   Pulse 96   Temp (!) 97.4 F (36.3 C) (Oral)   Ht 5' 2" (1.575 m)   Wt 207 lb 8 oz (94.1 kg)   BMI 37.95 kg/m  Wt Readings from Last 3 Encounters:  08/04/20 207 lb 8 oz (94.1 kg)  05/19/20 210 lb (95.3 kg)  02/15/20 207 lb (93.9 kg)   Urine dipstick shows positive for glucose.  Micro exam: negative for all components.   Health Maintenance Due  Topic Date Due   Pneumococcal Vaccine 71-41 Years old (1 - PCV) Never done   MAMMOGRAM  Never done   Zoster Vaccines- Shingrix (1 of 2) Never done   COVID-19 Vaccine (3 - Booster for Moderna series) 04/21/2020    There are no preventive care reminders to display for this patient.   No results found for: TSH Lab  Results  Component Value Date   WBC 11.4 (H)  05/19/2020   HGB 13.3 05/19/2020   HCT 40.2 05/19/2020   MCV 89 05/19/2020   PLT 377 05/19/2020   Lab Results  Component Value Date   NA 138 05/19/2020   K 4.7 05/19/2020   CO2 25 05/19/2020   GLUCOSE 111 (H) 05/19/2020   BUN 13 05/19/2020   CREATININE 0.53 (L) 05/19/2020   BILITOT <0.2 05/19/2020   ALKPHOS 69 05/19/2020   AST 20 05/19/2020   ALT 26 05/19/2020   PROT 6.0 05/19/2020   ALBUMIN 4.0 05/19/2020   CALCIUM 9.6 05/19/2020   ANIONGAP 13 01/14/2014   EGFR 111 05/19/2020   Lab Results  Component Value Date   CHOL 122 05/19/2020   Lab Results  Component Value Date   HDL 45 05/19/2020   Lab Results  Component Value Date   LDLCALC 58 05/19/2020   Lab Results  Component Value Date   TRIG 99 05/19/2020   Lab Results  Component Value Date   CHOLHDL 2.7 05/19/2020   Lab Results  Component Value Date   HGBA1C 6.8 05/19/2020       Assessment & Plan:   Madison Gentry was seen today for dysuria.  Diagnoses and all orders for this visit:  Dysuria UA negative. Can continue cranberry pills as needed. Increase hydration. Will give pyridium for possible bladder spasms.  -     Urinalysis, Routine w reflex microscopic  Bladder spasm -     phenazopyridine (PYRIDIUM) 100 MG tablet; Take 1 tablet (100 mg total) by mouth 3 (three) times daily as needed for pain.  Return to office for new or worsening symptoms, or if symptoms persist.   The patient indicates understanding of these issues and agrees with the plan.   Gwenlyn Perking, FNP

## 2020-08-12 ENCOUNTER — Other Ambulatory Visit: Payer: Self-pay | Admitting: Nurse Practitioner

## 2020-08-12 DIAGNOSIS — I10 Essential (primary) hypertension: Secondary | ICD-10-CM

## 2020-08-16 ENCOUNTER — Other Ambulatory Visit: Payer: Self-pay | Admitting: Nurse Practitioner

## 2020-08-16 DIAGNOSIS — F5101 Primary insomnia: Secondary | ICD-10-CM

## 2020-08-16 DIAGNOSIS — K219 Gastro-esophageal reflux disease without esophagitis: Secondary | ICD-10-CM

## 2020-08-18 ENCOUNTER — Other Ambulatory Visit: Payer: Self-pay | Admitting: Nurse Practitioner

## 2020-08-18 DIAGNOSIS — F5101 Primary insomnia: Secondary | ICD-10-CM

## 2020-08-25 ENCOUNTER — Ambulatory Visit: Payer: Medicaid Other | Admitting: Nurse Practitioner

## 2020-08-25 ENCOUNTER — Ambulatory Visit: Payer: Self-pay | Admitting: Nurse Practitioner

## 2020-08-25 ENCOUNTER — Encounter: Payer: Self-pay | Admitting: Nurse Practitioner

## 2020-08-25 ENCOUNTER — Other Ambulatory Visit: Payer: Self-pay

## 2020-08-25 VITALS — BP 117/77 | HR 90 | Temp 98.2°F | Resp 20 | Ht 62.0 in | Wt 210.0 lb

## 2020-08-25 DIAGNOSIS — I10 Essential (primary) hypertension: Secondary | ICD-10-CM | POA: Diagnosis not present

## 2020-08-25 DIAGNOSIS — J449 Chronic obstructive pulmonary disease, unspecified: Secondary | ICD-10-CM

## 2020-08-25 DIAGNOSIS — E1142 Type 2 diabetes mellitus with diabetic polyneuropathy: Secondary | ICD-10-CM

## 2020-08-25 DIAGNOSIS — G8929 Other chronic pain: Secondary | ICD-10-CM

## 2020-08-25 DIAGNOSIS — F3341 Major depressive disorder, recurrent, in partial remission: Secondary | ICD-10-CM

## 2020-08-25 DIAGNOSIS — D72829 Elevated white blood cell count, unspecified: Secondary | ICD-10-CM

## 2020-08-25 DIAGNOSIS — M545 Low back pain, unspecified: Secondary | ICD-10-CM

## 2020-08-25 DIAGNOSIS — I25119 Atherosclerotic heart disease of native coronary artery with unspecified angina pectoris: Secondary | ICD-10-CM | POA: Diagnosis not present

## 2020-08-25 DIAGNOSIS — K219 Gastro-esophageal reflux disease without esophagitis: Secondary | ICD-10-CM

## 2020-08-25 DIAGNOSIS — E7800 Pure hypercholesterolemia, unspecified: Secondary | ICD-10-CM | POA: Diagnosis not present

## 2020-08-25 DIAGNOSIS — F411 Generalized anxiety disorder: Secondary | ICD-10-CM

## 2020-08-25 DIAGNOSIS — Z720 Tobacco use: Secondary | ICD-10-CM

## 2020-08-25 DIAGNOSIS — F5101 Primary insomnia: Secondary | ICD-10-CM

## 2020-08-25 LAB — BAYER DCA HB A1C WAIVED: HB A1C (BAYER DCA - WAIVED): 6.9 % (ref <7.0)

## 2020-08-25 MED ORDER — FLUOXETINE HCL 40 MG PO CAPS: 40.0000 mg | ORAL_CAPSULE | Freq: Two times a day (BID) | ORAL | 1 refills | Status: DC

## 2020-08-25 MED ORDER — BUSPIRONE HCL 7.5 MG PO TABS: 7.5000 mg | ORAL_TABLET | Freq: Every day | ORAL | 2 refills | Status: DC

## 2020-08-25 MED ORDER — ZOLPIDEM TARTRATE 10 MG PO TABS: 10.0000 mg | ORAL_TABLET | Freq: Every day | ORAL | 2 refills | Status: DC

## 2020-08-25 MED ORDER — OMEPRAZOLE 40 MG PO CPDR: 40.0000 mg | DELAYED_RELEASE_CAPSULE | Freq: Every day | ORAL | 1 refills | Status: DC

## 2020-08-25 MED ORDER — SIMVASTATIN 40 MG PO TABS: 40.0000 mg | ORAL_TABLET | Freq: Every day | ORAL | 1 refills | Status: DC

## 2020-08-25 MED ORDER — METFORMIN HCL 500 MG PO TABS: 500.0000 mg | ORAL_TABLET | Freq: Two times a day (BID) | ORAL | 1 refills | Status: DC

## 2020-08-25 MED ORDER — AMLODIPINE BESYLATE 10 MG PO TABS: 10.0000 mg | ORAL_TABLET | Freq: Every day | ORAL | 1 refills | Status: DC

## 2020-08-25 MED ORDER — HYDROCHLOROTHIAZIDE 25 MG PO TABS: 25.0000 mg | ORAL_TABLET | Freq: Every day | ORAL | 1 refills | Status: DC

## 2020-08-25 MED ORDER — SYMBICORT 80-4.5 MCG/ACT IN AERO: 2.0000 | INHALATION_SPRAY | Freq: Two times a day (BID) | RESPIRATORY_TRACT | 3 refills | Status: DC

## 2020-08-25 MED ORDER — EZETIMIBE 10 MG PO TABS: 10.0000 mg | ORAL_TABLET | Freq: Every day | ORAL | 1 refills | Status: DC

## 2020-08-25 MED ORDER — LISINOPRIL 10 MG PO TABS: 10.0000 mg | ORAL_TABLET | Freq: Every day | ORAL | 1 refills | Status: DC

## 2020-08-25 NOTE — Progress Notes (Signed)
Subjective:    Patient ID: Madison Gentry, female    DOB: [birth year], 55 y.o.   MRN: [id number]  Chief Complaint: Medical Management of Chronic Issues    HPI:  1. Primary hypertension No c/o chest pain , sob or headache. Does not check blood pressure at home. BP Readings from Last 3 Encounters:  08/25/20 117/77  08/04/20 123/64  05/19/20 110/67     2. Pure hypercholesterolemia Does try to watch diet and does very little exercise. Lab Results  Component Value Date   CHOL 122 05/19/2020   HDL 45 05/19/2020   LDLCALC 58 05/19/2020   TRIG 99 05/19/2020   CHOLHDL 2.7 05/19/2020     3. Type 2 diabetes mellitus with diabetic polyneuropathy, without long-term current use of insulin (HCC) She has not been checking her blood sugars at home. Does not really watch diet. Lab Results  Component Value Date   HGBA1C 6.8 05/19/2020     4. Atherosclerosis of native coronary artery of native heart with angina pectoris (HCC) No chest pain, or sob. No dyspnea on exertion.  5. Chronic obstructive pulmonary disease, unspecified COPD type ([place]) Is on symbicort daily which helps with her breathing.  6. Gastroesophageal reflux disease without esophagitis Is on omeprazole daily and is doing well.  7. Recurrent major depressive disorder, in partial remission ([place]) Is on prozac and is doing well. No medication side effects. Depression screen [facility] 2/9 08/25/2020 08/04/2020 05/19/2020  Decreased Interest 2 1 1   Down, Depressed, Hopeless 2 1 1   PHQ - 2 Score 4 2 2   Altered sleeping 3 2 1   Tired, decreased energy 3 2 1   Change in appetite 3 2 1   Feeling bad or failure about yourself  0 0 0  Trouble concentrating 1 0 0  Moving slowly or fidgety/restless 0 0 0  Suicidal thoughts 0 0 0  PHQ-9 Score 14 8 5   Difficult doing work/chores Extremely dIfficult Somewhat difficult Not difficult at all  Some recent data might be hidden     8. GAD (generalized anxiety disorder) Is on buspar which helps  some. GAD 7 : Generalized Anxiety Score 08/25/2020 05/19/2020 02/15/2020 10/05/2019  Nervous, Anxious, on Edge 2 3 0 0  Control/stop worrying 2 1 0 0  Worry too much - different things 3 1 - 0  Trouble relaxing 3 1 0 1  Restless 1 1 0 0  Easily annoyed or irritable 3 1 0 1  Afraid - awful might happen 2 0 0 0  Total GAD 7 Score 16 8 - 2  Anxiety Difficulty Not difficult at all Somewhat difficult Not difficult at all Not difficult at all      9. Primary insomnia Is on ambien to sleep. Sleeps about 7-8 hours a night  10. Tobacco user Smokes over a pack a day  11. Chronic midline low back pain without sciatica Sees pain management- had some injections yesterday    12. Morbid obesity (HCC) Weight is up 3 lbs Wt Readings from Last 3 Encounters:  08/25/20 210 lb (95.3 kg)  08/04/20 207 lb 8 oz (94.1 kg)  05/19/20 210 lb (95.3 kg)   BMI Readings from Last 3 Encounters:  08/25/20 38.41 kg/m  08/04/20 37.95 kg/m  05/19/20 38.41 kg/m       Outpatient Encounter Medications as of 08/25/2020  Medication Sig   amLODipine (NORVASC) 10 MG tablet Take 1 tablet (10 mg total) by mouth daily.   Aspirin-Acetaminophen-Caffeine 520-260-32.5 MG PACK Take 1 packet by  mouth 2 (two) times daily as needed. For pain or headache   busPIRone (BUSPAR) 7.5 MG tablet Take 1 tablet (7.5 mg total) by mouth daily.   cyclobenzaprine (FLEXERIL) 10 MG tablet Take 1 tablet (10 mg total) by mouth 3 (three) times daily.   ezetimibe (ZETIA) 10 MG tablet Take 1 tablet (10 mg total) by mouth daily. TAKE 1 TABLET BY MOUTH ONCE DAILY   FLUoxetine (PROZAC) 40 MG capsule Take 1 capsule (40 mg total) by mouth 2 (two) times daily.   hydrochlorothiazide (HYDRODIURIL) 25 MG tablet Take 1 tablet by mouth once daily   HYDROcodone-acetaminophen (NORCO) 10-325 MG tablet Take 1 tablet by mouth 4 (four) times daily as needed.   ibuprofen (ADVIL,MOTRIN) 200 MG tablet Take 200 mg by mouth every 6 (six) hours as needed. Takes  800 mg when needed for menstrual cramping   Incontinence Supplies KIT Patient needs Pull ups, chuck pads and wipes.   lisinopril (ZESTRIL) 10 MG tablet Take 1 tablet (10 mg total) by mouth daily.   metFORMIN (GLUCOPHAGE) 500 MG tablet Take 1 tablet (500 mg total) by mouth 2 (two) times daily with a meal.   nicotine (NICODERM CQ - DOSED IN MG/24 HOURS) 21 mg/24hr patch APPLY ONE PATCH TOPICALLY ONCE DAILY   nitroGLYCERIN (NITROSTAT) 0.4 MG SL tablet DISSOLVE ONE TABLET UNDER THE TONGUE EVERY 5 MINUTES AS NEEDED FOR CHEST PAIN.  DO NOT EXCEED A TOTAL OF 3 DOSES IN 15 MINUTES   omeprazole (PRILOSEC) 40 MG capsule Take 1 capsule by mouth once daily   simvastatin (ZOCOR) 40 MG tablet Take 1 tablet (40 mg total) by mouth daily at 6 PM.   SYMBICORT 80-4.5 MCG/ACT inhaler Inhale 2 puffs into the lungs 2 (two) times daily.   zolpidem (AMBIEN) 10 MG tablet Take 1 tablet (10 mg total) by mouth at bedtime.   [DISCONTINUED] phenazopyridine (PYRIDIUM) 100 MG tablet Take 1 tablet (100 mg total) by mouth 3 (three) times daily as needed for pain.   No facility-administered encounter medications on file as of 08/25/2020.    Past Surgical History:  Procedure Laterality Date   TUBAL LIGATION      Family History  Problem Relation Age of Onset   CAD Mother 66   CAD Father 75       Died age 43   CAD Brother 80    New complaints: None today  Social history: Lives with husband and children and grandchildren  Controlled substance contract: 02/16/20     Review of Systems  Constitutional:  Negative for diaphoresis.  Eyes:  Negative for pain.  Respiratory:  Positive for shortness of breath (occasionally).   Cardiovascular:  Positive for leg swelling. Negative for chest pain and palpitations.  Gastrointestinal:  Negative for abdominal pain.  Endocrine: Negative for polydipsia.  Skin:  Negative for rash.  Neurological:  Negative for dizziness, weakness and headaches.  Hematological:  Does not  bruise/bleed easily.  All other systems reviewed and are negative.     Objective:   Physical Exam Vitals and nursing note reviewed.  Constitutional:      General: She is not in acute distress.    Appearance: Normal appearance. She is well-developed.  HENT:     Head: Normocephalic.     Right Ear: Tympanic membrane normal.     Left Ear: Tympanic membrane normal.     Nose: Nose normal.     Mouth/Throat:     Mouth: Mucous membranes are moist.  Eyes:  Pupils: Pupils are equal, round, and reactive to light.  Neck:     Vascular: No carotid bruit or JVD.  Cardiovascular:     Rate and Rhythm: Normal rate and regular rhythm.     Heart sounds: Normal heart sounds.  Pulmonary:     Effort: Pulmonary effort is normal. No respiratory distress.     Breath sounds: Normal breath sounds. No wheezing or rales.  Chest:     Chest wall: No tenderness.  Abdominal:     General: Bowel sounds are normal. There is no distension or abdominal bruit.     Palpations: Abdomen is soft. There is no hepatomegaly, splenomegaly, mass or pulsatile mass.     Tenderness: There is no abdominal tenderness.  Musculoskeletal:        General: Normal range of motion.     Cervical back: Normal range of motion and neck supple.  Lymphadenopathy:     Cervical: No cervical adenopathy.  Skin:    General: Skin is warm and dry.  Neurological:     Mental Status: She is alert and oriented to person, place, and time.     Deep Tendon Reflexes: Reflexes are normal and symmetric.  Psychiatric:        Behavior: Behavior normal.        Thought Content: Thought content normal.        Judgment: Judgment normal.   BP 117/77   Pulse 90   Temp 98.2 F (36.8 C) (Temporal)   Resp 20   Ht $R'5\' 2"'Ks$  (1.575 m)   Wt 210 lb (95.3 kg)   SpO2 95%   BMI 38.41 kg/m   Hgba1c 6.9%       Assessment & Plan:   Brigett Estell comes in today with chief complaint of Medical Management of Chronic Issues   Diagnosis and orders  addressed:  1. Primary hypertension Low sodium diet - CBC with Differential/Platelet - CMP14+EGFR - hydrochlorothiazide (HYDRODIURIL) 25 MG tablet; Take 1 tablet (25 mg total) by mouth daily.  Dispense: 90 tablet; Refill: 1 - lisinopril (ZESTRIL) 10 MG tablet; Take 1 tablet (10 mg total) by mouth daily.  Dispense: 90 tablet; Refill: 1 - amLODipine (NORVASC) 10 MG tablet; Take 1 tablet (10 mg total) by mouth daily.  Dispense: 90 tablet; Refill: 1  2. Pure hypercholesterolemia Low fat diet - Lipid panel - simvastatin (ZOCOR) 40 MG tablet; Take 1 tablet (40 mg total) by mouth daily at 6 PM.  Dispense: 90 tablet; Refill: 1 - ezetimibe (ZETIA) 10 MG tablet; Take 1 tablet (10 mg total) by mouth daily. TAKE 1 TABLET BY MOUTH ONCE DAILY  Dispense: 90 tablet; Refill: 1  3. Type 2 diabetes mellitus with diabetic polyneuropathy, without long-term current use of insulin (HCC) Contniue to wtach carbs in diet - Bayer DCA Hb A1c Waived - Microalbumin / creatinine urine ratio - metFORMIN (GLUCOPHAGE) 500 MG tablet; Take 1 tablet (500 mg total) by mouth 2 (two) times daily with a meal.  Dispense: 180 tablet; Refill: 1  4. Atherosclerosis of native coronary artery of native heart with angina pectoris ([place])  5. Chronic obstructive pulmonary disease, unspecified COPD type ([place]) - SYMBICORT 80-4.5 MCG/ACT inhaler; Inhale 2 puffs into the lungs 2 (two) times daily.  Dispense: 10.2 g; Refill: 3  6. Gastroesophageal reflux disease without esophagitis .mmmmge - omeprazole (PRILOSEC) 40 MG capsule; Take 1 capsule (40 mg total) by mouth daily.  Dispense: 90 capsule; Refill: 1  7. Recurrent major depressive disorder, in partial remission ([place])  Stress management - FLUoxetine (PROZAC) 40 MG capsule; Take 1 capsule (40 mg total) by mouth 2 (two) times daily.  Dispense: 180 capsule; Refill: 1  8. GAD (generalized anxiety disorder) - busPIRone (BUSPAR) 7.5 MG tablet; Take 1 tablet (7.5 mg total) by mouth daily.   Dispense: 90 tablet; Refill: 2  9. Primary insomnia Bedtime routine - zolpidem (AMBIEN) 10 MG tablet; Take 1 tablet (10 mg total) by mouth at bedtime.  Dispense: 30 tablet; Refill: 2  10. Tobacco user Smoking cessation encouraged  11. Chronic midline low back pain without sciatica Keep follow up with pain management  12. Morbid obesity ([place]) Discussed diet and exercise for person with BMI >25 Will recheck weight in 3-6 months   Labs pending Health Maintenance reviewed Diet and exercise encouraged  Follow up plan: 3 months   Mary-Margaret Hassell Done, FNP

## 2020-08-25 NOTE — Patient Instructions (Signed)

## 2020-08-26 LAB — LIPID PANEL
Chol/HDL Ratio: 2.7 ratio (ref 0.0–4.4)
Cholesterol, Total: 127 mg/dL (ref 100–199)
HDL: 47 mg/dL (ref >39)
LDL Chol Calc (NIH): 51 mg/dL (ref 0–99)
Triglycerides: 178 mg/dL — ABNORMAL HIGH (ref 0–149)
VLDL Cholesterol Cal: 29 mg/dL (ref 5–40)

## 2020-08-26 LAB — CBC WITH DIFFERENTIAL/PLATELET
Basophils Absolute: 0 10*3/uL (ref 0.0–0.2)
Basos: 0 %
EOS (ABSOLUTE): 0 10*3/uL (ref 0.0–0.4)
Eos: 0 %
Hematocrit: 40.8 % (ref 34.0–46.6)
Hemoglobin: 13.8 g/dL (ref 11.1–15.9)
Immature Grans (Abs): 0.1 10*3/uL (ref 0.0–0.1)
Immature Granulocytes: 1 %
Lymphocytes Absolute: 3.3 10*3/uL — ABNORMAL HIGH (ref 0.7–3.1)
Lymphs: 20 %
MCH: 29.3 pg (ref 26.6–33.0)
MCHC: 33.8 g/dL (ref 31.5–35.7)
MCV: 87 fL (ref 79–97)
Monocytes Absolute: 1.2 10*3/uL — ABNORMAL HIGH (ref 0.1–0.9)
Monocytes: 7 %
Neutrophils Absolute: 12.5 10*3/uL — ABNORMAL HIGH (ref 1.4–7.0)
Neutrophils: 72 %
Platelets: 448 10*3/uL (ref 150–450)
RBC: 4.71 x10E6/uL (ref 3.77–5.28)
RDW: 13.3 % (ref 11.7–15.4)
WBC: 17.1 10*3/uL — ABNORMAL HIGH (ref 3.4–10.8)

## 2020-08-26 LAB — MICROALBUMIN / CREATININE URINE RATIO
Creatinine, Urine: 79.2 mg/dL
Microalb/Creat Ratio: <4 mg/g creat (ref 0–29)
Microalbumin, Urine: <3 ug/mL

## 2020-08-26 LAB — CMP14+EGFR
ALT: 30 IU/L (ref 0–32)
AST: 16 IU/L (ref 0–40)
Albumin/Globulin Ratio: 1.9 (ref 1.2–2.2)
Albumin: 4.3 g/dL (ref 3.8–4.9)
Alkaline Phosphatase: 70 IU/L (ref 44–121)
BUN/Creatinine Ratio: 26 — ABNORMAL HIGH (ref 9–23)
BUN: 12 mg/dL (ref 6–24)
Bilirubin Total: <0.2 mg/dL (ref 0.0–1.2)
CO2: 25 mmol/L (ref 20–29)
Calcium: 9.7 mg/dL (ref 8.7–10.2)
Chloride: 100 mmol/L (ref 96–106)
Creatinine, Ser: 0.47 mg/dL — ABNORMAL LOW (ref 0.57–1.00)
Globulin, Total: 2.3 g/dL (ref 1.5–4.5)
Glucose: 155 mg/dL — ABNORMAL HIGH (ref 65–99)
Potassium: 4.8 mmol/L (ref 3.5–5.2)
Sodium: 140 mmol/L (ref 134–144)
Total Protein: 6.6 g/dL (ref 6.0–8.5)
eGFR: 114 mL/min/{1.73_m2} (ref >59)

## 2020-08-30 NOTE — Addendum Note (Signed)
Addended by: Bennie Pierini on: 08/30/2020 01:41 PM   Modules accepted: Orders

## 2020-09-06 ENCOUNTER — Other Ambulatory Visit: Payer: Self-pay

## 2020-09-06 ENCOUNTER — Telehealth: Payer: Self-pay | Admitting: Nurse Practitioner

## 2020-09-06 DIAGNOSIS — D72829 Elevated white blood cell count, unspecified: Secondary | ICD-10-CM

## 2020-09-06 NOTE — Telephone Encounter (Signed)
Referral was placed for gynecology instead of hematology. New referral placed and attempted to contact patient with no answer and her voicemail is full.

## 2020-09-26 ENCOUNTER — Encounter: Payer: Medicaid Other | Admitting: Obstetrics & Gynecology

## 2020-10-07 ENCOUNTER — Ambulatory Visit (HOSPITAL_COMMUNITY): Payer: Medicaid Other | Admitting: Hematology and Oncology

## 2020-10-18 NOTE — Progress Notes (Signed)
[facility] [address] [place] [address]   CLINIC:  Medical Oncology/Hematology  Patient Care Team: Bennie Pierini, FNP as PCP - General (Family Medicine) Ileana Ladd, MD as Attending Physician (Family Medicine) Bennie Pierini, FNP as Nurse Practitioner (Nurse Practitioner) Doreatha Massed, MD as Medical Oncologist (Hematology)  CHIEF COMPLAINTS/PURPOSE OF CONSULTATION:  Evaluation of leukocytosis  HISTORY OF PRESENTING ILLNESS:  Madison Gentry 55 y.o. female is here because of evaluation of leukocytosis, at the request of [facility].  Today she reports feeling good. She denies any recent, fevers, night sweats, and weight loss. Her last infection was a UTI 6 months ago. She denies a history of lupus or RA, and she is not currently using any steroids. She is not currently using Symbicort. She has never had a mammogram.   She currently lives at home. She denies any family history of cancer. 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 She has smoked 1-2 ppd for 37 years.   MEDICAL HISTORY:  Past Medical History:  Diagnosis Date   CAD (coronary artery disease)    COPD (chronic obstructive pulmonary disease) (HCC)    Diabetes (HCC)    Hypertension    Stroke (HCC)     SURGICAL HISTORY: Past Surgical History:  Procedure Laterality Date   TUBAL LIGATION      SOCIAL HISTORY: Social History   Socioeconomic History   Marital status: Single    Spouse name: Not on file   Number of children: 3   Years of education: Not on file   Highest education level: Not on file  Occupational History   Not on file  Tobacco Use   Smoking status: Every Day    Packs/day: 1.50    Years: 28.00    Pack years: 42.00    Types: Cigarettes   Smokeless tobacco: Never  Substance and Sexual Activity   Alcohol use: Yes    Comment: occas   Drug use: No   Sexual activity: Yes    Birth control/protection: None  Other Topics Concern    Not on file  Social History Narrative   Lives at home with the father of her children.     Social Determinants of Health   Financial Resource Strain: Low Risk    Difficulty of Paying Living Expenses: Not hard at all  Food Insecurity: No Food Insecurity   Worried About Programme researcher, broadcasting/film/video in the Last Year: Never true   Ran Out of Food in the Last Year: Never true  Transportation Needs: No Transportation Needs   Lack of Transportation (Medical): No   Lack of Transportation (Non-Medical): No  Physical Activity: Inactive   Days of Exercise per Week: 0 days   Minutes of Exercise per Session: 0 min  Stress: Stress Concern Present   Feeling of Stress : To some extent  Social Connections: Moderately Integrated   Frequency of Communication with Friends and Family: More than three times a week   Frequency of Social Gatherings with Friends and Family: More than three times a week   Attends Religious Services: More than 4 times per year   Active Member of [employer] or Organizations: No   Attends Banker Meetings: Never   Marital Status: Living with partner  Intimate Partner Violence: Not At Risk   Fear of Current or Ex-Partner: No   Emotionally Abused: No   Physically Abused: No   Sexually Abused: No    FAMILY HISTORY: Family  History  Problem Relation Age of Onset   CAD Mother 39   CAD Father 68       Died age 57   CAD Brother 42    ALLERGIES:  has No Known Allergies.  MEDICATIONS:  Current Outpatient Medications  Medication Sig Dispense Refill   amLODipine (NORVASC) 10 MG tablet Take 1 tablet (10 mg total) by mouth daily. 90 tablet 1   Aspirin-Acetaminophen-Caffeine 520-260-32.5 MG PACK Take 1 packet by mouth 2 (two) times daily as needed. For pain or headache     busPIRone (BUSPAR) 7.5 MG tablet Take 1 tablet (7.5 mg total) by mouth daily. 90 tablet 2   BUTRANS 10 MCG/HR PTWK 1 patch once a week.     cyclobenzaprine (FLEXERIL) 10 MG tablet Take 1 tablet (10 mg  total) by mouth 3 (three) times daily. 20 tablet 0   ezetimibe (ZETIA) 10 MG tablet Take 1 tablet (10 mg total) by mouth daily. TAKE 1 TABLET BY MOUTH ONCE DAILY 90 tablet 1   FLUoxetine (PROZAC) 40 MG capsule Take 1 capsule (40 mg total) by mouth 2 (two) times daily. 180 capsule 1   hydrochlorothiazide (HYDRODIURIL) 25 MG tablet Take 1 tablet (25 mg total) by mouth daily. 90 tablet 1   HYDROcodone-acetaminophen (NORCO) 10-325 MG tablet Take 1 tablet by mouth 4 (four) times daily as needed.     ibuprofen (ADVIL,MOTRIN) 200 MG tablet Take 200 mg by mouth every 6 (six) hours as needed. Takes 800 mg when needed for menstrual cramping     Incontinence Supplies KIT Patient needs Pull ups, chuck pads and wipes. 1 kit 0   lisinopril (ZESTRIL) 10 MG tablet Take 1 tablet (10 mg total) by mouth daily. 90 tablet 1   metFORMIN (GLUCOPHAGE) 500 MG tablet Take 1 tablet (500 mg total) by mouth 2 (two) times daily with a meal. 180 tablet 1   nicotine (NICODERM CQ - DOSED IN MG/24 HOURS) 21 mg/24hr patch APPLY ONE PATCH TOPICALLY ONCE DAILY 28 patch 2   omeprazole (PRILOSEC) 40 MG capsule Take 1 capsule (40 mg total) by mouth daily. 90 capsule 1   simvastatin (ZOCOR) 40 MG tablet Take 1 tablet (40 mg total) by mouth daily at 6 PM. 90 tablet 1   SYMBICORT 80-4.5 MCG/ACT inhaler Inhale 2 puffs into the lungs 2 (two) times daily. 10.2 g 3   zolpidem (AMBIEN) 10 MG tablet Take 1 tablet (10 mg total) by mouth at bedtime. 30 tablet 2   nitroGLYCERIN (NITROSTAT) 0.4 MG SL tablet DISSOLVE ONE TABLET UNDER THE TONGUE EVERY 5 MINUTES AS NEEDED FOR CHEST PAIN.  DO NOT EXCEED A TOTAL OF 3 DOSES IN 15 MINUTES (Patient not taking: Reported on 10/19/2020) 25 tablet 0   No current facility-administered medications for this visit.    REVIEW OF SYSTEMS:   Review of Systems  Constitutional:  Positive for fatigue (10%). Negative for appetite change.  HENT:   Positive for trouble swallowing (chewing due to teeth).    Gastrointestinal:  Positive for diarrhea, nausea and vomiting.  Musculoskeletal:  Positive for myalgias (9/10 L leg and lower back).  Neurological:  Positive for dizziness, headaches and numbness (hands and L leg).  Psychiatric/Behavioral:  Positive for depression and sleep disturbance.   All other systems reviewed and are negative.   PHYSICAL EXAMINATION: ECOG PERFORMANCE STATUS: 1 - Symptomatic but completely ambulatory  Vitals:   10/19/20 1335  BP: 129/64  Pulse: 88  Resp: 17  Temp: (!) 97.1 F (36.2 C)  SpO2: 96%   Filed Weights   10/19/20 1335  Weight: 209 lb 9.6 oz (95.1 kg)   Physical Exam Vitals reviewed.  Constitutional:      Appearance: Normal appearance.  Cardiovascular:     Rate and Rhythm: Normal rate and regular rhythm.     Pulses: Normal pulses.     Heart sounds: Normal heart sounds.  Pulmonary:     Effort: Pulmonary effort is normal.     Breath sounds: Normal breath sounds.  Abdominal:     Palpations: Abdomen is soft. There is no hepatomegaly, splenomegaly or mass.     Tenderness: There is no abdominal tenderness.  Musculoskeletal:     Right lower leg: No edema.     Left lower leg: No edema.  Lymphadenopathy:     Upper Body:     Right upper body: No supraclavicular adenopathy.     Left upper body: No supraclavicular adenopathy.  Neurological:     General: No focal deficit present.     Mental Status: She is alert and oriented to person, place, and time.  Psychiatric:        Mood and Affect: Mood normal.        Behavior: Behavior normal.     LABORATORY DATA:  I have reviewed the data as listed Recent Results (from the past 2160 hour(s))  Urinalysis, Routine w reflex microscopic     Status: Abnormal   Collection Time: 08/04/20  2:04 PM  Result Value Ref Range   Specific Gravity, UA 1.015 1.005 - 1.030   pH, UA 5.5 5.0 - 7.5   Color, UA Yellow Yellow   Appearance Ur Clear Clear   Leukocytes,UA Negative Negative   Protein,UA Negative  Negative/Trace   Glucose, UA Trace (A) Negative   Ketones, UA Negative Negative   RBC, UA Negative Negative   Bilirubin, UA Negative Negative   Urobilinogen, Ur 0.2 0.2 - 1.0 mg/dL   Nitrite, UA Negative Negative   Microscopic Examination See below:   Microscopic Examination     Status: None   Collection Time: 08/04/20  2:04 PM   Urine  Result Value Ref Range   WBC, UA None seen 0 - 5 /hpf   RBC None seen 0 - 2 /hpf   Epithelial Cells (non renal) 0-10 0 - 10 /hpf   Bacteria, UA None seen None seen/Few  Bayer DCA Hb A1c Waived     Status: None   Collection Time: 08/25/20 12:34 PM  Result Value Ref Range   HB A1C (BAYER DCA - WAIVED) 6.9 <7.0 %    Comment:                                       Diabetic Adult            <7.0                                       Healthy Adult        4.3 - 5.7                                                           (  DCCT/NGSP) American Diabetes Association's Summary of Glycemic Recommendations for Adults with Diabetes: Hemoglobin A1c <7.0%. More stringent glycemic goals (A1c <6.0%) may further reduce complications at the cost of increased risk of hypoglycemia.   CBC with Differential/Platelet     Status: Abnormal   Collection Time: 08/25/20 12:41 PM  Result Value Ref Range   WBC 17.1 (H) 3.4 - 10.8 x10E3/uL   RBC 4.71 3.77 - 5.28 x10E6/uL   Hemoglobin 13.8 11.1 - 15.9 g/dL   Hematocrit 40.8 34.0 - 46.6 %   MCV 87 79 - 97 fL   MCH 29.3 26.6 - 33.0 pg   MCHC 33.8 31.5 - 35.7 g/dL   RDW 13.3 11.7 - 15.4 %   Platelets 448 150 - 450 x10E3/uL   Neutrophils 72 Not Estab. %   Lymphs 20 Not Estab. %   Monocytes 7 Not Estab. %   Eos 0 Not Estab. %   Basos 0 Not Estab. %   Neutrophils Absolute 12.5 (H) 1.4 - 7.0 x10E3/uL   Lymphocytes Absolute 3.3 (H) 0.7 - 3.1 x10E3/uL   Monocytes Absolute 1.2 (H) 0.1 - 0.9 x10E3/uL   EOS (ABSOLUTE) 0.0 0.0 - 0.4 x10E3/uL   Basophils Absolute 0.0 0.0 - 0.2 x10E3/uL   Immature Granulocytes 1 Not Estab. %    Immature Grans (Abs) 0.1 0.0 - 0.1 x10E3/uL  CMP14+EGFR     Status: Abnormal   Collection Time: 08/25/20 12:41 PM  Result Value Ref Range   Glucose 155 (H) 65 - 99 mg/dL   BUN 12 6 - 24 mg/dL   Creatinine, Ser 0.47 (L) 0.57 - 1.00 mg/dL   eGFR 114 >59 mL/min/1.73   BUN/Creatinine Ratio 26 (H) 9 - 23   Sodium 140 134 - 144 mmol/L   Potassium 4.8 3.5 - 5.2 mmol/L   Chloride 100 96 - 106 mmol/L   CO2 25 20 - 29 mmol/L   Calcium 9.7 8.7 - 10.2 mg/dL   Total Protein 6.6 6.0 - 8.5 g/dL   Albumin 4.3 3.8 - 4.9 g/dL   Globulin, Total 2.3 1.5 - 4.5 g/dL   Albumin/Globulin Ratio 1.9 1.2 - 2.2   Bilirubin Total <0.2 0.0 - 1.2 mg/dL   Alkaline Phosphatase 70 44 - 121 IU/L   AST 16 0 - 40 IU/L   ALT 30 0 - 32 IU/L  Lipid panel     Status: Abnormal   Collection Time: 08/25/20 12:41 PM  Result Value Ref Range   Cholesterol, Total 127 100 - 199 mg/dL   Triglycerides 178 (H) 0 - 149 mg/dL   HDL 47 >39 mg/dL   VLDL Cholesterol Cal 29 5 - 40 mg/dL   LDL Chol Calc (NIH) 51 0 - 99 mg/dL   Chol/HDL Ratio 2.7 0.0 - 4.4 ratio    Comment:                                   T. Chol/HDL Ratio                                             Men  Women                               1/2 Avg.Risk  3.4  3.3                                   Avg.Risk  5.0    4.4                                2X Avg.Risk  9.6    7.1                                3X Avg.Risk 23.4   11.0   Microalbumin / creatinine urine ratio     Status: None   Collection Time: 08/25/20 12:41 PM  Result Value Ref Range   Creatinine, Urine 79.2 Not Estab. mg/dL   Microalbumin, Urine <3.0 Not Estab. ug/mL   Microalb/Creat Ratio <4 0 - 29 mg/g creat    Comment:                        Normal:                0 -  29                        Moderately increased: 30 - 300                        Severely increased:       >300   Sedimentation rate     Status: None   Collection Time: 10/19/20  2:48 PM  Result Value Ref Range   Sed Rate 7 0 - 22  mm/hr    Comment: Performed at [facility], [address] [place], [place] [address]  Rheumatoid factor     Status: None   Collection Time: 10/19/20  2:48 PM  Result Value Ref Range   Rhuematoid fact SerPl-aCnc 11.1 <14.0 IU/mL    Comment: (NOTE) Performed At: [facility] [place], [place] [id number] Rush Farmer MD [id number]   Lactate dehydrogenase     Status: None   Collection Time: 10/19/20  2:48 PM  Result Value Ref Range   LDH 131 98 - 192 U/L    Comment: Performed at [facility], [address], [place], [place] [address]  CBC with Differential     Status: Abnormal   Collection Time: 10/19/20  2:48 PM  Result Value Ref Range   WBC 10.9 (H) 4.0 - 10.5 K/uL   RBC 4.67 3.87 - 5.11 MIL/uL   Hemoglobin 13.5 12.0 - 15.0 g/dL   HCT 41.2 36.0 - 46.0 %   MCV 88.2 80.0 - 100.0 fL   MCH 28.9 26.0 - 34.0 pg   MCHC 32.8 30.0 - 36.0 g/dL   RDW 13.5 11.5 - 15.5 %   Platelets 436 (H) 150 - 400 K/uL   nRBC 0.0 0.0 - 0.2 %   Neutrophils Relative % 65 %   Neutro Abs 7.1 1.7 - 7.7 K/uL   Lymphocytes Relative 24 %   Lymphs Abs 2.6 0.7 - 4.0 K/uL   Monocytes Relative 6 %   Monocytes Absolute 0.6 0.1 - 1.0 K/uL   Eosinophils Relative 4 %   Eosinophils Absolute 0.5 0.0 - 0.5 K/uL   Basophils Relative 1 %   Basophils Absolute 0.1 0.0 - 0.1  K/uL   Immature Granulocytes 0 %   Abs Immature Granulocytes 0.04 0.00 - 0.07 K/uL    Comment: Performed at [facility], [address] [place], [place] [address]  C-reactive protein     Status: None   Collection Time: 10/19/20  2:49 PM  Result Value Ref Range   CRP 0.7 <1.0 mg/dL    Comment: Performed at [facility], [address] [place], [place] [address]    RADIOGRAPHIC STUDIES: I have personally reviewed the radiological images as listed and agreed with the findings in the report. No results found.  ASSESSMENT:  1.  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 - She was evaluated previously in 2015, with negative JAK2 V665F and BCR ABL mutations.  At that time it was thought to be secondary to smoking. - Recent CBC on 08/25/2020 showed white count 17.1, slightly worse than previously.  Hemoglobin and platelet count was normal.  Differential showed increased neutrophil count, lymphocyte count and monocyte counts. - She denies any fevers, night sweats or weight loss.  Last UTI was 6 months ago. - Denies any systemic steroids.  Does not use Symbicort on regular basis.  No prior history of splenectomy.  2.  Social/family history: -Lives with granddaughter and does housework.  She worked in a Museum/gallery exhibitions officer, Water engineer. - Smoked 1 and half to 2 packs/day since age 25.  Current active smoker. - No family history of leukocytosis or leukemias.  PLAN:  1.  JAK2 V665F/BCR/ABL negative leukocytosis: - Discussed various etiologies of her leukocytosis. - Physical examination does not reveal any palpable adenopathy or splenomegaly. - We will repeat CBC today.  We will check flow cytometry along with LDH. - We will also check for reactive process including ESR, CRP, ANA and rheumatoid factor. - Most likely etiology is demargination of leukocytes from smoking. - RTC 2 to 3 weeks to discuss results.   All questions were answered. The patient knows to call the clinic with any problems, questions or concerns.   Derek Jack, MD 10/20/20 7:37 PM  [place] [phone]   I, Thana Ates, am acting as a scribe for Dr. Derek Jack.  I, Derek Jack MD, have reviewed the above documentation for accuracy and completeness, and I agree with the above.

## 2020-10-19 ENCOUNTER — Other Ambulatory Visit: Payer: Self-pay

## 2020-10-19 ENCOUNTER — Encounter (HOSPITAL_COMMUNITY): Payer: Self-pay | Admitting: Hematology

## 2020-10-19 ENCOUNTER — Inpatient Hospital Stay (HOSPITAL_COMMUNITY): Payer: Medicaid Other

## 2020-10-19 ENCOUNTER — Inpatient Hospital Stay (HOSPITAL_COMMUNITY): Payer: Medicaid Other | Attending: Hematology and Oncology | Admitting: Hematology

## 2020-10-19 VITALS — BP 129/64 | HR 88 | Temp 97.1°F | Resp 17 | Ht 62.0 in | Wt 209.6 lb

## 2020-10-19 DIAGNOSIS — F17210 Nicotine dependence, cigarettes, uncomplicated: Secondary | ICD-10-CM | POA: Diagnosis not present

## 2020-10-19 DIAGNOSIS — D72829 Elevated white blood cell count, unspecified: Secondary | ICD-10-CM | POA: Diagnosis not present

## 2020-10-19 DIAGNOSIS — Z87891 Personal history of nicotine dependence: Secondary | ICD-10-CM

## 2020-10-19 DIAGNOSIS — Z122 Encounter for screening for malignant neoplasm of respiratory organs: Secondary | ICD-10-CM

## 2020-10-19 DIAGNOSIS — Z1231 Encounter for screening mammogram for malignant neoplasm of breast: Secondary | ICD-10-CM

## 2020-10-19 LAB — CBC WITH DIFFERENTIAL/PLATELET
Abs Immature Granulocytes: 0.04 10*3/uL (ref 0.00–0.07)
Basophils Absolute: 0.1 10*3/uL (ref 0.0–0.1)
Basophils Relative: 1 %
Eosinophils Absolute: 0.5 10*3/uL (ref 0.0–0.5)
Eosinophils Relative: 4 %
HCT: 41.2 % (ref 36.0–46.0)
Hemoglobin: 13.5 g/dL (ref 12.0–15.0)
Immature Granulocytes: 0 %
Lymphocytes Relative: 24 %
Lymphs Abs: 2.6 10*3/uL (ref 0.7–4.0)
MCH: 28.9 pg (ref 26.0–34.0)
MCHC: 32.8 g/dL (ref 30.0–36.0)
MCV: 88.2 fL (ref 80.0–100.0)
Monocytes Absolute: 0.6 10*3/uL (ref 0.1–1.0)
Monocytes Relative: 6 %
Neutro Abs: 7.1 10*3/uL (ref 1.7–7.7)
Neutrophils Relative %: 65 %
Platelets: 436 10*3/uL — ABNORMAL HIGH (ref 150–400)
RBC: 4.67 MIL/uL (ref 3.87–5.11)
RDW: 13.5 % (ref 11.5–15.5)
WBC: 10.9 10*3/uL — ABNORMAL HIGH (ref 4.0–10.5)
nRBC: 0 % (ref 0.0–0.2)

## 2020-10-19 LAB — C-REACTIVE PROTEIN: CRP: 0.7 mg/dL (ref <1.0)

## 2020-10-19 LAB — SEDIMENTATION RATE: Sed Rate: 7 mm/hr (ref 0–22)

## 2020-10-19 LAB — LACTATE DEHYDROGENASE: LDH: 131 U/L (ref 98–192)

## 2020-10-19 NOTE — Patient Instructions (Addendum)
[facility] at [facility] Discharge Instructions   You were seen and examined today by Dr. Ellin Saba. Dr. Ellin Saba is a hematologist, meaning he specializes in blood disorders. Dr. Ellin Saba discussed your past medical history, family history of cancer/blood disorders, and the events that led to you being here today.  You were referred to Dr. Ellin Saba due to your elevated white blood cells. In 2015, it was also elevated. This likely means that it is most likely a benign cause. Often, smokers can have increased elevated WBCs, this is because smoking causes inflammation.  Dr. Ellin Saba has also recommended a mammogram and Lung Cancer Screening CT scan.  Follow-up as scheduled.   Thank you for choosing [facility] at [facility] to provide your oncology and hematology care.  To afford each patient quality time with our provider, please arrive at least 15 minutes before your scheduled appointment time.   If you have a lab appointment with the Cancer Center please come in thru the Main Entrance and check in at the main information desk.  You need to re-schedule your appointment should you arrive 10 or more minutes late.  We strive to give you quality time with our providers, and arriving late affects you and other patients whose appointments are after yours.  Also, if you no show three or more times for appointments you may be dismissed from the clinic at the providers discretion.     Again, [facility]  Our hope is that these requests will decrease the amount of time that you wait before being seen by our physicians.       _____________________________________________________________  Should you have questions after your visit to [facility], please contact our office at [phone] and follow the prompts.  Our office hours are 8:00 a.m. and 4:30 p.m. Monday - Friday.  Please note that voicemails  left after 4:00 p.m. may not be returned until the following business day.  We are closed weekends and major holidays.  You do have access to a nurse 24-7, just call the main number to the clinic [phone] and do not press any options, hold on the line and a nurse will answer the phone.    For prescription refill requests, have your pharmacy contact our office and allow 72 hours.    Due to Covid, you will need to wear a mask upon entering the hospital. If you do not have a mask, a mask will be given to you at the Main Entrance upon arrival. For doctor visits, patients may have 1 support person age 25 or older with them. For treatment visits, patients can not have anyone with them due to social distancing guidelines and our immunocompromised population.

## 2020-10-20 LAB — RHEUMATOID FACTOR: Rheumatoid fact SerPl-aCnc: 11.1 IU/mL (ref <14.0)

## 2020-10-25 LAB — SURGICAL PATHOLOGY

## 2020-10-27 LAB — ANTINUCLEAR ANTIBODIES, IFA: ANA Ab, IFA: NEGATIVE

## 2020-11-22 ENCOUNTER — Ambulatory Visit (HOSPITAL_COMMUNITY): Admission: RE | Admit: 2020-11-22 | Payer: Medicaid Other | Source: Ambulatory Visit

## 2020-11-23 ENCOUNTER — Other Ambulatory Visit: Payer: Self-pay | Admitting: Nurse Practitioner

## 2020-11-23 DIAGNOSIS — F5101 Primary insomnia: Secondary | ICD-10-CM

## 2020-11-24 ENCOUNTER — Inpatient Hospital Stay (HOSPITAL_COMMUNITY): Admission: RE | Admit: 2020-11-24 | Payer: Medicaid Other | Source: Ambulatory Visit

## 2020-11-25 ENCOUNTER — Other Ambulatory Visit: Payer: Self-pay

## 2020-11-25 ENCOUNTER — Ambulatory Visit: Payer: Medicaid Other | Admitting: Nurse Practitioner

## 2020-11-25 ENCOUNTER — Encounter: Payer: Self-pay | Admitting: Nurse Practitioner

## 2020-11-25 VITALS — BP 120/72 | HR 93 | Temp 98.2°F | Resp 20 | Ht 62.0 in | Wt 211.0 lb

## 2020-11-25 DIAGNOSIS — E1142 Type 2 diabetes mellitus with diabetic polyneuropathy: Secondary | ICD-10-CM

## 2020-11-25 DIAGNOSIS — F5101 Primary insomnia: Secondary | ICD-10-CM

## 2020-11-25 DIAGNOSIS — I10 Essential (primary) hypertension: Secondary | ICD-10-CM | POA: Diagnosis not present

## 2020-11-25 DIAGNOSIS — K219 Gastro-esophageal reflux disease without esophagitis: Secondary | ICD-10-CM

## 2020-11-25 DIAGNOSIS — F3341 Major depressive disorder, recurrent, in partial remission: Secondary | ICD-10-CM

## 2020-11-25 DIAGNOSIS — I25119 Atherosclerotic heart disease of native coronary artery with unspecified angina pectoris: Secondary | ICD-10-CM | POA: Diagnosis not present

## 2020-11-25 DIAGNOSIS — E7800 Pure hypercholesterolemia, unspecified: Secondary | ICD-10-CM

## 2020-11-25 DIAGNOSIS — F411 Generalized anxiety disorder: Secondary | ICD-10-CM

## 2020-11-25 DIAGNOSIS — J449 Chronic obstructive pulmonary disease, unspecified: Secondary | ICD-10-CM

## 2020-11-25 LAB — LIPID PANEL
Chol/HDL Ratio: 2.9 ratio (ref 0.0–4.4)
Cholesterol, Total: 131 mg/dL (ref 100–199)
HDL: 45 mg/dL (ref >39)
LDL Chol Calc (NIH): 66 mg/dL (ref 0–99)
Triglycerides: 111 mg/dL (ref 0–149)
VLDL Cholesterol Cal: 20 mg/dL (ref 5–40)

## 2020-11-25 LAB — CMP14+EGFR
ALT: 33 IU/L — ABNORMAL HIGH (ref 0–32)
AST: 28 IU/L (ref 0–40)
Albumin/Globulin Ratio: 2 (ref 1.2–2.2)
Albumin: 4.2 g/dL (ref 3.8–4.9)
Alkaline Phosphatase: 68 IU/L (ref 44–121)
BUN/Creatinine Ratio: 21 (ref 9–23)
BUN: 8 mg/dL (ref 6–24)
Bilirubin Total: <0.2 mg/dL (ref 0.0–1.2)
CO2: 25 mmol/L (ref 20–29)
Calcium: 9.2 mg/dL (ref 8.7–10.2)
Chloride: 100 mmol/L (ref 96–106)
Creatinine, Ser: 0.38 mg/dL — ABNORMAL LOW (ref 0.57–1.00)
Globulin, Total: 2.1 g/dL (ref 1.5–4.5)
Glucose: 129 mg/dL — ABNORMAL HIGH (ref 70–99)
Potassium: 4.6 mmol/L (ref 3.5–5.2)
Sodium: 140 mmol/L (ref 134–144)
Total Protein: 6.3 g/dL (ref 6.0–8.5)
eGFR: 120 mL/min/{1.73_m2} (ref >59)

## 2020-11-25 LAB — CBC WITH DIFFERENTIAL/PLATELET
Basophils Absolute: 0.1 10*3/uL (ref 0.0–0.2)
Basos: 1 %
EOS (ABSOLUTE): 0.6 10*3/uL — ABNORMAL HIGH (ref 0.0–0.4)
Eos: 5 %
Hematocrit: 40.4 % (ref 34.0–46.6)
Hemoglobin: 13.6 g/dL (ref 11.1–15.9)
Immature Grans (Abs): 0 10*3/uL (ref 0.0–0.1)
Immature Granulocytes: 0 %
Lymphocytes Absolute: 2.8 10*3/uL (ref 0.7–3.1)
Lymphs: 23 %
MCH: 29.2 pg (ref 26.6–33.0)
MCHC: 33.7 g/dL (ref 31.5–35.7)
MCV: 87 fL (ref 79–97)
Monocytes Absolute: 0.8 10*3/uL (ref 0.1–0.9)
Monocytes: 7 %
Neutrophils Absolute: 7.8 10*3/uL — ABNORMAL HIGH (ref 1.4–7.0)
Neutrophils: 64 %
Platelets: 434 10*3/uL (ref 150–450)
RBC: 4.65 x10E6/uL (ref 3.77–5.28)
RDW: 12.7 % (ref 11.7–15.4)
WBC: 12.3 10*3/uL — ABNORMAL HIGH (ref 3.4–10.8)

## 2020-11-25 LAB — BAYER DCA HB A1C WAIVED: HB A1C (BAYER DCA - WAIVED): 6.8 % — ABNORMAL HIGH (ref 4.8–5.6)

## 2020-11-25 MED ORDER — BUSPIRONE HCL 7.5 MG PO TABS: 7.5000 mg | ORAL_TABLET | Freq: Every day | ORAL | 2 refills | Status: DC

## 2020-11-25 MED ORDER — ZOLPIDEM TARTRATE 10 MG PO TABS: 10.0000 mg | ORAL_TABLET | Freq: Every day | ORAL | 2 refills | Status: DC

## 2020-11-25 NOTE — Progress Notes (Signed)
Subjective:    Patient ID: Madison Gentry, female    DOB: November 30, 1965, 55 y.o.   MRN: [id number]   Chief Complaint: Medical Management of Chronic Issues (Left leg pain. Difficulty walking. Hurting for a few weeks)    HPI:  1. Primary hypertension No c/o chest pain, sob or headache. Does not check her blood pressure at home. BP Readings from Last 3 Encounters:  11/25/20 120/72  10/19/20 129/64  08/25/20 117/77     2. Pure hypercholesterolemia Doe snot watch diet and does no dedicated exercise due to pain. Lab Results  Component Value Date   CHOL 127 08/25/2020   HDL 47 08/25/2020   LDLCALC 51 08/25/2020   TRIG 178 (H) 08/25/2020   CHOLHDL 2.7 08/25/2020     3. Type 2 diabetes mellitus with diabetic polyneuropathy, without long-term current use of insulin (HCC) She does not check her blood sugars at home, nor does she watch her diet. Lab Results  Component Value Date   HGBA1C 6.9 08/25/2020     4. Atherosclerosis of native coronary artery of native heart with angina pectoris [facility]) Does not see cardiology .   5. Chronic obstructive pulmonary disease, unspecified COPD type ([place]) Is on symbicort daily. Needs albuterol about 1x  a week. Smokes over a pck a day.  6. Gastroesophageal reflux disease without esophagitis Is on omeprazole daily and that seems to keep symptoms under control.  7. Recurrent major depressive disorder, in partial remission ([place]) Is on prozac daily and is doing well. Depression screen [facility] 2/9 11/25/2020 08/25/2020 08/04/2020  Decreased Interest _0 Down, Depressed, Hopeless _1 PHQ - 2 Score _2 Altered sleeping _3 Tired, decreased energy _4 Change in appetite _5 Feeling bad or failure about yourself  1 0 0  Trouble concentrating 0 1 0  Moving slowly or fidgety/restless 0 0 0  Suicidal thoughts 0 0 0  PHQ-9 Score _6 Difficult doing work/chores Not difficult at all Extremely dIfficult Somewhat difficult  Some recent data  might be hidden     8. GAD (generalized anxiety disorder) Is on buspar and usually only takes 1time a day. GAD 7 : Generalized Anxiety Score 11/25/2020 08/25/2020 05/19/2020 02/15/2020  Nervous, Anxious, on Edge _7 0  Control/stop worrying _8 0  Worry too much - different things _9 -  Trouble relaxing _10 0  Restless _11 0  Easily annoyed or irritable _12 0  Afraid - awful might happen 1 2 0 0  Total GAD 7 Score _13 -  Anxiety Difficulty Extremely difficult Not difficult at all Somewhat difficult Not difficult at all      9. Primary insomnia Is on ambien nightly and sill has problems sleeping  10. Morbid obesity ([place]) No recent weight changes Wt Readings from Last 3 Encounters:  11/25/20 211 lb (95.7 kg)  10/19/20 209 lb 9.6 oz (95.1 kg)  08/25/20 210 lb (95.3 kg)   BMI Readings from Last 3 Encounters:  11/25/20 38.59 kg/m  10/19/20 38.34 kg/m  08/25/20 38.41 kg/m       Outpatient Encounter Medications as of 11/25/2020  Medication Sig   amLODipine (NORVASC) 10 MG tablet Take 1 tablet (10 mg total) by mouth daily.   Aspirin-Acetaminophen-Caffeine 520-260-32.5 MG PACK Take 1 packet by mouth 2 (two) times daily as needed. For pain  or headache   busPIRone (BUSPAR) 7.5 MG tablet Take 1 tablet (7.5 mg total) by mouth daily.   BUTRANS 10 MCG/HR PTWK 1 patch once a week.   cyclobenzaprine (FLEXERIL) 10 MG tablet Take 1 tablet (10 mg total) by mouth 3 (three) times daily.   ezetimibe (ZETIA) 10 MG tablet Take 1 tablet (10 mg total) by mouth daily. TAKE 1 TABLET BY MOUTH ONCE DAILY   FLUoxetine (PROZAC) 40 MG capsule Take 1 capsule (40 mg total) by mouth 2 (two) times daily.   hydrochlorothiazide (HYDRODIURIL) 25 MG tablet Take 1 tablet (25 mg total) by mouth daily.   HYDROcodone-acetaminophen (NORCO) 10-325 MG tablet Take 1 tablet by mouth 4 (four) times daily as needed.   ibuprofen (ADVIL,MOTRIN) 200 MG tablet Take 200 mg by mouth every 6 (six) hours as  needed. Takes 800 mg when needed for menstrual cramping   Incontinence Supplies KIT Patient needs Pull ups, chuck pads and wipes.   lisinopril (ZESTRIL) 10 MG tablet Take 1 tablet (10 mg total) by mouth daily.   metFORMIN (GLUCOPHAGE) 500 MG tablet Take 1 tablet (500 mg total) by mouth 2 (two) times daily with a meal.   nicotine (NICODERM CQ - DOSED IN MG/24 HOURS) 21 mg/24hr patch APPLY ONE PATCH TOPICALLY ONCE DAILY   nitroGLYCERIN (NITROSTAT) 0.4 MG SL tablet DISSOLVE ONE TABLET UNDER THE TONGUE EVERY 5 MINUTES AS NEEDED FOR CHEST PAIN.  DO NOT EXCEED A TOTAL OF 3 DOSES IN 15 MINUTES (Patient taking differently: DISSOLVE ONE TABLET UNDER THE TONGUE EVERY 5 MINUTES AS NEEDED FOR CHEST PAIN.  DO NOT EXCEED A TOTAL OF 3 DOSES IN 15 MINUTES)   omeprazole (PRILOSEC) 40 MG capsule Take 1 capsule (40 mg total) by mouth daily.   simvastatin (ZOCOR) 40 MG tablet Take 1 tablet (40 mg total) by mouth daily at 6 PM.   SYMBICORT 80-4.5 MCG/ACT inhaler Inhale 2 puffs into the lungs 2 (two) times daily.   zolpidem (AMBIEN) 10 MG tablet Take 1 tablet (10 mg total) by mouth at bedtime.   No facility-administered encounter medications on file as of 11/25/2020.    Past Surgical History:  Procedure Laterality Date   TUBAL LIGATION      Family History  Problem Relation Age of Onset   CAD Mother 58   CAD Father 55       Died age 61   CAD Brother 76    New complaints: None today  Social history: Lives with husband and grandkids  Controlled substance contract: 02/16/20      Review of Systems  Constitutional:  Negative for diaphoresis.  Eyes:  Negative for pain.  Respiratory:  Negative for shortness of breath.   Cardiovascular:  Negative for chest pain, palpitations and leg swelling.  Gastrointestinal:  Negative for abdominal pain.  Endocrine: Negative for polydipsia.  Musculoskeletal:  Positive for back pain (chronic).  Skin:  Negative for rash.  Neurological:  Negative for dizziness,  weakness and headaches.  Hematological:  Does not bruise/bleed easily.  All other systems reviewed and are negative.     Objective:   Physical Exam Vitals and nursing note reviewed.  Constitutional:      General: She is not in acute distress.    Appearance: Normal appearance. She is well-developed.  HENT:     Head: Normocephalic.     Right Ear: Tympanic membrane normal.     Left Ear: Tympanic membrane normal.     Nose: Nose normal.     Mouth/Throat:  Mouth: Mucous membranes are moist.  Eyes:     Pupils: Pupils are equal, round, and reactive to light.  Neck:     Vascular: No carotid bruit or JVD.  Cardiovascular:     Rate and Rhythm: Normal rate and regular rhythm.     Heart sounds: Normal heart sounds.  Pulmonary:     Effort: Pulmonary effort is normal. No respiratory distress.     Breath sounds: Normal breath sounds. No wheezing or rales.  Chest:     Chest wall: No tenderness.  Abdominal:     General: Bowel sounds are normal. There is no distension or abdominal bruit.     Palpations: Abdomen is soft. There is no hepatomegaly, splenomegaly, mass or pulsatile mass.     Tenderness: There is no abdominal tenderness.  Musculoskeletal:        General: Normal range of motion.     Cervical back: Normal range of motion and neck supple.     Comments: Rises slowly form sitting to standing  Lymphadenopathy:     Cervical: No cervical adenopathy.  Skin:    General: Skin is warm and dry.  Neurological:     Mental Status: She is alert and oriented to person, place, and time.     Deep Tendon Reflexes: Reflexes are normal and symmetric.  Psychiatric:        Behavior: Behavior normal.        Thought Content: Thought content normal.        Judgment: Judgment normal.      BP 120/72   Pulse 93   Temp 98.2 F (36.8 C) (Temporal)   Resp 20   Ht _0  (1.575 m)   Wt 211 lb (95.7 kg)   SpO2 97%   BMI 38.59 kg/m   HGBA1c 6.8%    Assessment & Plan:  Madison Gentry comes in  today with chief complaint of Medical Management of Chronic Issues (Left leg pain. Difficulty walking. Hurting for a few weeks)   Diagnosis and orders addressed:  1. Primary hypertension Low sodium diet - CBC with Differential/Platelet - CMP14+EGFR  2. Pure hypercholesterolemia Low fat diet - Lipid panel  3. Type 2 diabetes mellitus with diabetic polyneuropathy, without long-term current use of insulin (HCC) Stricter carb counting - Bayer DCA Hb A1c Waived - Microalbumin / creatinine urine ratio  4. Atherosclerosis of native coronary artery of native heart with angina pectoris ([place])  5. Chronic obstructive pulmonary disease, unspecified COPD type ([place]) Smoking cessation encouraged Continue symbicort daily Albuterol as needed  6. Gastroesophageal reflux disease without esophagitis Avoid spicy foods Do not eat 2 hours prior to bedtime  7. Recurrent major depressive disorder, in partial remission ([place]) Stress management  8. GAD (generalized anxiety disorder) - busPIRone (BUSPAR) 7.5 MG tablet; Take 1 tablet (7.5 mg total) by mouth daily.  Dispense: 90 tablet; Refill: 2  9. Primary insomnia Bedtime routine - zolpidem (AMBIEN) 10 MG tablet; Take 1 tablet (10 mg total) by mouth at bedtime.  Dispense: 30 tablet; Refill: 2  10. Morbid obesity ([place]) Discussed diet and exercise for person with BMI >25 Will recheck weight in 3-6 months    Labs pending Health Maintenance reviewed Diet and exercise encouraged  Follow up plan: 3 months   Mary-Margaret Hassell Done, FNP

## 2020-11-25 NOTE — Patient Instructions (Signed)

## 2020-11-28 ENCOUNTER — Ambulatory Visit (HOSPITAL_COMMUNITY): Payer: Medicaid Other | Admitting: Hematology

## 2020-12-15 ENCOUNTER — Ambulatory Visit (HOSPITAL_COMMUNITY): Payer: Medicaid Other

## 2020-12-21 NOTE — Progress Notes (Deleted)
NO SHOW

## 2020-12-22 ENCOUNTER — Inpatient Hospital Stay (HOSPITAL_COMMUNITY): Payer: Medicaid Other | Attending: Hematology and Oncology | Admitting: Physician Assistant

## 2020-12-28 ENCOUNTER — Inpatient Hospital Stay (HOSPITAL_COMMUNITY): Admission: RE | Admit: 2020-12-28 | Payer: Medicaid Other | Source: Ambulatory Visit

## 2021-01-05 LAB — FLOW CYTOMETRY

## 2021-01-11 ENCOUNTER — Ambulatory Visit (HOSPITAL_COMMUNITY): Payer: Medicaid Other

## 2021-02-21 ENCOUNTER — Ambulatory Visit: Payer: Medicaid Other | Admitting: Nurse Practitioner

## 2021-02-22 ENCOUNTER — Other Ambulatory Visit: Payer: Self-pay | Admitting: Nurse Practitioner

## 2021-02-22 DIAGNOSIS — F5101 Primary insomnia: Secondary | ICD-10-CM

## 2021-03-02 ENCOUNTER — Encounter: Payer: Self-pay | Admitting: Nurse Practitioner

## 2021-03-02 ENCOUNTER — Ambulatory Visit: Payer: Medicaid Other | Admitting: Nurse Practitioner

## 2021-03-02 VITALS — BP 121/78 | HR 89 | Temp 98.2°F | Resp 20 | Ht 62.0 in | Wt 209.0 lb

## 2021-03-02 DIAGNOSIS — E7800 Pure hypercholesterolemia, unspecified: Secondary | ICD-10-CM | POA: Diagnosis not present

## 2021-03-02 DIAGNOSIS — N393 Stress incontinence (female) (male): Secondary | ICD-10-CM

## 2021-03-02 DIAGNOSIS — I25119 Atherosclerotic heart disease of native coronary artery with unspecified angina pectoris: Secondary | ICD-10-CM

## 2021-03-02 DIAGNOSIS — Z720 Tobacco use: Secondary | ICD-10-CM

## 2021-03-02 DIAGNOSIS — K219 Gastro-esophageal reflux disease without esophagitis: Secondary | ICD-10-CM

## 2021-03-02 DIAGNOSIS — R051 Acute cough: Secondary | ICD-10-CM

## 2021-03-02 DIAGNOSIS — F3341 Major depressive disorder, recurrent, in partial remission: Secondary | ICD-10-CM

## 2021-03-02 DIAGNOSIS — M545 Low back pain, unspecified: Secondary | ICD-10-CM

## 2021-03-02 DIAGNOSIS — I10 Essential (primary) hypertension: Secondary | ICD-10-CM

## 2021-03-02 DIAGNOSIS — J449 Chronic obstructive pulmonary disease, unspecified: Secondary | ICD-10-CM

## 2021-03-02 DIAGNOSIS — F5101 Primary insomnia: Secondary | ICD-10-CM

## 2021-03-02 DIAGNOSIS — G8929 Other chronic pain: Secondary | ICD-10-CM

## 2021-03-02 DIAGNOSIS — E1142 Type 2 diabetes mellitus with diabetic polyneuropathy: Secondary | ICD-10-CM

## 2021-03-02 DIAGNOSIS — F411 Generalized anxiety disorder: Secondary | ICD-10-CM

## 2021-03-02 LAB — CMP14+EGFR
ALT: 22 IU/L (ref 0–32)
AST: 19 IU/L (ref 0–40)
Albumin/Globulin Ratio: 2 (ref 1.2–2.2)
Albumin: 4.2 g/dL (ref 3.8–4.9)
Alkaline Phosphatase: 63 IU/L (ref 44–121)
BUN/Creatinine Ratio: 16 (ref 9–23)
BUN: 7 mg/dL (ref 6–24)
Bilirubin Total: <0.2 mg/dL (ref 0.0–1.2)
CO2: 26 mmol/L (ref 20–29)
Calcium: 9.3 mg/dL (ref 8.7–10.2)
Chloride: 101 mmol/L (ref 96–106)
Creatinine, Ser: 0.45 mg/dL — ABNORMAL LOW (ref 0.57–1.00)
Globulin, Total: 2.1 g/dL (ref 1.5–4.5)
Glucose: 174 mg/dL — ABNORMAL HIGH (ref 70–99)
Potassium: 4.5 mmol/L (ref 3.5–5.2)
Sodium: 141 mmol/L (ref 134–144)
Total Protein: 6.3 g/dL (ref 6.0–8.5)
eGFR: 115 mL/min/{1.73_m2} (ref >59)

## 2021-03-02 LAB — CBC WITH DIFFERENTIAL/PLATELET
Basophils Absolute: 0.1 10*3/uL (ref 0.0–0.2)
Basos: 1 %
EOS (ABSOLUTE): 0.7 10*3/uL — ABNORMAL HIGH (ref 0.0–0.4)
Eos: 6 %
Hematocrit: 40.7 % (ref 34.0–46.6)
Hemoglobin: 13 g/dL (ref 11.1–15.9)
Immature Grans (Abs): 0 10*3/uL (ref 0.0–0.1)
Immature Granulocytes: 0 %
Lymphocytes Absolute: 3 10*3/uL (ref 0.7–3.1)
Lymphs: 25 %
MCH: 28.1 pg (ref 26.6–33.0)
MCHC: 31.9 g/dL (ref 31.5–35.7)
MCV: 88 fL (ref 79–97)
Monocytes Absolute: 0.9 10*3/uL (ref 0.1–0.9)
Monocytes: 8 %
Neutrophils Absolute: 7.3 10*3/uL — ABNORMAL HIGH (ref 1.4–7.0)
Neutrophils: 60 %
Platelets: 450 10*3/uL (ref 150–450)
RBC: 4.63 x10E6/uL (ref 3.77–5.28)
RDW: 13.3 % (ref 11.7–15.4)
WBC: 12.1 10*3/uL — ABNORMAL HIGH (ref 3.4–10.8)

## 2021-03-02 LAB — LIPID PANEL
Chol/HDL Ratio: 2.3 ratio (ref 0.0–4.4)
Cholesterol, Total: 122 mg/dL (ref 100–199)
HDL: 52 mg/dL (ref >39)
LDL Chol Calc (NIH): 49 mg/dL (ref 0–99)
Triglycerides: 121 mg/dL (ref 0–149)
VLDL Cholesterol Cal: 21 mg/dL (ref 5–40)

## 2021-03-02 LAB — BAYER DCA HB A1C WAIVED: HB A1C (BAYER DCA - WAIVED): 7.4 % — ABNORMAL HIGH (ref 4.8–5.6)

## 2021-03-02 MED ORDER — FLUOXETINE HCL 40 MG PO CAPS: 40.0000 mg | ORAL_CAPSULE | Freq: Two times a day (BID) | ORAL | 1 refills | Status: DC

## 2021-03-02 MED ORDER — EZETIMIBE 10 MG PO TABS: 10.0000 mg | ORAL_TABLET | Freq: Every day | ORAL | 1 refills | Status: DC

## 2021-03-02 MED ORDER — HYDROCHLOROTHIAZIDE 25 MG PO TABS: 25.0000 mg | ORAL_TABLET | Freq: Every day | ORAL | 1 refills | Status: DC

## 2021-03-02 MED ORDER — BLOOD GLUCOSE METER KIT: PACK | 0 refills | Status: DC

## 2021-03-02 MED ORDER — BUSPIRONE HCL 7.5 MG PO TABS: 7.5000 mg | ORAL_TABLET | Freq: Every day | ORAL | 2 refills | Status: DC

## 2021-03-02 MED ORDER — SYMBICORT 80-4.5 MCG/ACT IN AERO: 2.0000 | INHALATION_SPRAY | Freq: Two times a day (BID) | RESPIRATORY_TRACT | 3 refills | Status: DC

## 2021-03-02 MED ORDER — SIMVASTATIN 40 MG PO TABS: 40.0000 mg | ORAL_TABLET | Freq: Every day | ORAL | 1 refills | Status: DC

## 2021-03-02 MED ORDER — LISINOPRIL 10 MG PO TABS: 10.0000 mg | ORAL_TABLET | Freq: Every day | ORAL | 1 refills | Status: DC

## 2021-03-02 MED ORDER — PREDNISONE 20 MG PO TABS: 40.0000 mg | ORAL_TABLET | Freq: Every day | ORAL | 0 refills | Status: AC

## 2021-03-02 MED ORDER — AMLODIPINE BESYLATE 10 MG PO TABS: 10.0000 mg | ORAL_TABLET | Freq: Every day | ORAL | 1 refills | Status: DC

## 2021-03-02 MED ORDER — OMEPRAZOLE 40 MG PO CPDR: 40.0000 mg | DELAYED_RELEASE_CAPSULE | Freq: Every day | ORAL | 1 refills | Status: DC

## 2021-03-02 MED ORDER — METFORMIN HCL 500 MG PO TABS: 500.0000 mg | ORAL_TABLET | Freq: Two times a day (BID) | ORAL | 1 refills | Status: DC

## 2021-03-02 MED ORDER — ZOLPIDEM TARTRATE 10 MG PO TABS: 10.0000 mg | ORAL_TABLET | Freq: Every day | ORAL | 2 refills | Status: DC

## 2021-03-02 NOTE — Progress Notes (Signed)
Subjective:    Patient ID: Madison Gentry, female    DOB: 21-Feb-1966, 56 y.o.   MRN: [id number]  Chief Complaint: medical management of chronic issues     HPI:  Madison Gentry is a 56 y.o. who identifies as a female who was assigned female at birth.   Social history: Lives with: husband and kids Work history: disability   Comes in today for follow up of the following chronic medical issues:  1. Primary hypertension No c/o chest pain, sob or headache.doe snot check blood pressure at home. BP Readings from Last 3 Encounters:  11/25/20 120/72  10/19/20 129/64  08/25/20 117/77     2. Atherosclerosis of native coronary artery of native heart with angina pectoris ([place]) Seen on chest xray- will continue to watch  3. Chronic obstructive pulmonary disease, unspecified COPD type ([place]) Has chronic cough is on symbicort inhaler daily. Has not needed albuterol.  4. Pure hypercholesterolemia Does not watch diet and does no dedicated exercise. Lab Results  Component Value Date   CHOL 131 11/25/2020   HDL 45 11/25/2020   LDLCALC 66 11/25/2020   TRIG 111 11/25/2020   CHOLHDL 2.9 11/25/2020     5. Type 2 diabetes mellitus with diabetic polyneuropathy, without long-term current use of insulin ([place]) She has not ben checking her blood sugars at home. Doe so really watch diet. Lab Results  Component Value Date   HGBA1C 6.8 (H) 11/25/2020     6. Gastroesophageal reflux disease without esophagitis Is on omepraozole dialy and that works well to keep symptoms under control  7. Recurrent major depressive disorder, in partial remission ([place]) Has been on prozac for several years. No medication side effects. Depression screen [facility] 2/9 03/02/2021 11/25/2020 08/25/2020  Decreased Interest 1 1 2   Down, Depressed, Hopeless 2 1 2   PHQ - 2 Score 3 2 4   Altered sleeping 1 1 3   Tired, decreased energy 1 1 3   Change in appetite 2 1 3   Feeling bad or failure about yourself  - 1 0  Trouble  concentrating 2 0 1  Moving slowly or fidgety/restless 0 0 0  Suicidal thoughts 0 0 0  PHQ-9 Score 9 6 14   Difficult doing work/chores Very difficult Not difficult at all Extremely dIfficult  Some recent data might be hidden     8. GAD (generalized anxiety disorder) Is on buspar and takes tid. Stays very anxious. GAD 7 : Generalized Anxiety Score 03/02/2021 11/25/2020 08/25/2020 05/19/2020  Nervous, Anxious, on Edge 2 1 2 3   Control/stop worrying 2 1 2 1   Worry too much - different things 2 1 3 1   Trouble relaxing 2 1 3 1   Restless 0 1 1 1   Easily annoyed or irritable 3 1 3 1   Afraid - awful might happen 1 1 2  0  Total GAD 7 Score 12 7 16 8   Anxiety Difficulty Very difficult Extremely difficult Not difficult at all Somewhat difficult      9. Primary insomnia Is on ambien to sleep at night. Sleeps about 7 hours a night.  10. Tobacco user Still smoking at least 1/2 pack a day  11. Stress incontinence of urine Has to wear depends and sleep on pads.  12. Chronic midline low back pain without sciatica Sees pain management monthly  13. Morbid obesity ([place]) No recent weight changes  Wt Readings from Last 3 Encounters:  03/02/21 209 lb (94.8 kg)  11/25/20 211 lb (95.7 kg)  10/19/20 209 lb 9.6 oz (95.1  kg)   BMI Readings from Last 3 Encounters:  03/02/21 38.23 kg/m  11/25/20 38.59 kg/m  10/19/20 38.34 kg/m       New complaints: None today  No Known Allergies Outpatient Encounter Medications as of 03/02/2021  Medication Sig   amLODipine (NORVASC) 10 MG tablet Take 1 tablet (10 mg total) by mouth daily.   Aspirin-Acetaminophen-Caffeine 520-260-32.5 MG PACK Take 1 packet by mouth 2 (two) times daily as needed. For pain or headache   busPIRone (BUSPAR) 7.5 MG tablet Take 1 tablet (7.5 mg total) by mouth daily.   BUTRANS 10 MCG/HR PTWK 1 patch once a week.   cyclobenzaprine (FLEXERIL) 10 MG tablet Take 1 tablet (10 mg total) by mouth 3 (three) times daily.   ezetimibe  (ZETIA) 10 MG tablet Take 1 tablet (10 mg total) by mouth daily. TAKE 1 TABLET BY MOUTH ONCE DAILY   FLUoxetine (PROZAC) 40 MG capsule Take 1 capsule (40 mg total) by mouth 2 (two) times daily.   hydrochlorothiazide (HYDRODIURIL) 25 MG tablet Take 1 tablet (25 mg total) by mouth daily.   HYDROcodone-acetaminophen (NORCO) 10-325 MG tablet Take 1 tablet by mouth 4 (four) times daily as needed.   ibuprofen (ADVIL,MOTRIN) 200 MG tablet Take 200 mg by mouth every 6 (six) hours as needed. Takes 800 mg when needed for menstrual cramping   Incontinence Supplies KIT Patient needs Pull ups, chuck pads and wipes.   lisinopril (ZESTRIL) 10 MG tablet Take 1 tablet (10 mg total) by mouth daily.   metFORMIN (GLUCOPHAGE) 500 MG tablet Take 1 tablet (500 mg total) by mouth 2 (two) times daily with a meal.   nicotine (NICODERM CQ - DOSED IN MG/24 HOURS) 21 mg/24hr patch APPLY ONE PATCH TOPICALLY ONCE DAILY   nitroGLYCERIN (NITROSTAT) 0.4 MG SL tablet DISSOLVE ONE TABLET UNDER THE TONGUE EVERY 5 MINUTES AS NEEDED FOR CHEST PAIN.  DO NOT EXCEED A TOTAL OF 3 DOSES IN 15 MINUTES (Patient taking differently: DISSOLVE ONE TABLET UNDER THE TONGUE EVERY 5 MINUTES AS NEEDED FOR CHEST PAIN.  DO NOT EXCEED A TOTAL OF 3 DOSES IN 15 MINUTES)   omeprazole (PRILOSEC) 40 MG capsule Take 1 capsule (40 mg total) by mouth daily.   simvastatin (ZOCOR) 40 MG tablet Take 1 tablet (40 mg total) by mouth daily at 6 PM.   SYMBICORT 80-4.5 MCG/ACT inhaler Inhale 2 puffs into the lungs 2 (two) times daily.   zolpidem (AMBIEN) 10 MG tablet Take 1 tablet (10 mg total) by mouth at bedtime.   No facility-administered encounter medications on file as of 03/02/2021.    Past Surgical History:  Procedure Laterality Date   TUBAL LIGATION      Family History  Problem Relation Age of Onset   CAD Mother 35   CAD Father 33       Died age 52   CAD Brother 84      Controlled substance contract: 03/02/21     Review of Systems   Constitutional:  Negative for diaphoresis.  Eyes:  Negative for pain.  Respiratory:  Positive for cough (chronic). Negative for shortness of breath.   Cardiovascular:  Negative for chest pain, palpitations and leg swelling.  Gastrointestinal:  Negative for abdominal pain.  Endocrine: Negative for polydipsia.  Musculoskeletal:  Positive for back pain (chronic).  Skin:  Negative for rash.  Neurological:  Negative for dizziness, weakness and headaches.  Hematological:  Does not bruise/bleed easily.  All other systems reviewed and are negative.  Objective:   Physical Exam Vitals and nursing note reviewed.  Constitutional:      General: She is not in acute distress.    Appearance: Normal appearance. She is well-developed.  HENT:     Head: Normocephalic.     Right Ear: Tympanic membrane normal.     Left Ear: Tympanic membrane normal.     Nose: Nose normal.     Mouth/Throat:     Mouth: Mucous membranes are moist.  Eyes:     Pupils: Pupils are equal, round, and reactive to light.  Neck:     Vascular: No carotid bruit or JVD.  Cardiovascular:     Rate and Rhythm: Normal rate and regular rhythm.     Heart sounds: Normal heart sounds.  Pulmonary:     Effort: Pulmonary effort is normal. No respiratory distress.     Breath sounds: Normal breath sounds. No wheezing or rales.     Comments: deep wet cough Chest:     Chest wall: No tenderness.  Abdominal:     General: Bowel sounds are normal. There is no distension or abdominal bruit.     Palpations: Abdomen is soft. There is no hepatomegaly, splenomegaly, mass or pulsatile mass.     Tenderness: There is no abdominal tenderness.  Musculoskeletal:        General: Normal range of motion.     Cervical back: Normal range of motion and neck supple.  Lymphadenopathy:     Cervical: No cervical adenopathy.  Skin:    General: Skin is warm and dry.  Neurological:     Mental Status: She is alert and oriented to person, place, and time.      Deep Tendon Reflexes: Reflexes are normal and symmetric.  Psychiatric:        Behavior: Behavior normal.        Thought Content: Thought content normal.        Judgment: Judgment normal.    BP 121/78    Pulse 89    Temp 98.2 F (36.8 C) (Temporal)    Resp 20    Ht $R'5\' 2"'bS$  (1.575 m)    Wt 209 lb (94.8 kg)    SpO2 95%    BMI 38.23 kg/m   Hgba1c 7.4%     Assessment & Plan:  Madison Gentry comes in today with chief complaint of Medical Management of Chronic Issues (Nagging cough that is keeping her awake at night/)   Diagnosis and orders addressed:  1. Primary hypertension Low sodium diet - CBC with Differential/Platelet - CMP14+EGFR - hydrochlorothiazide (HYDRODIURIL) 25 MG tablet; Take 1 tablet (25 mg total) by mouth daily.  Dispense: 90 tablet; Refill: 1 - lisinopril (ZESTRIL) 10 MG tablet; Take 1 tablet (10 mg total) by mouth daily.  Dispense: 90 tablet; Refill: 1 - amLODipine (NORVASC) 10 MG tablet; Take 1 tablet (10 mg total) by mouth daily.  Dispense: 90 tablet; Refill: 1  2. Atherosclerosis of native coronary artery of native heart with angina pectoris ([place])  3. Chronic obstructive pulmonary disease, unspecified COPD type ([place]) Use symbicort daily - SYMBICORT 80-4.5 MCG/ACT inhaler; Inhale 2 puffs into the lungs 2 (two) times daily.  Dispense: 10.2 g; Refill: 3  4. Pure hypercholesterolemia Low fat diet - Lipid panel - simvastatin (ZOCOR) 40 MG tablet; Take 1 tablet (40 mg total) by mouth daily at 6 PM.  Dispense: 90 tablet; Refill: 1 - ezetimibe (ZETIA) 10 MG tablet; Take 1 tablet (10 mg total) by mouth daily. TAKE 1 TABLET  BY MOUTH ONCE DAILY  Dispense: 90 tablet; Refill: 1  5. Type 2 diabetes mellitus with diabetic polyneuropathy, without long-term current use of insulin (HCC) Check blood sugars daily Rx for new meter given  Stricter carb counting - Bayer DCA Hb A1c Waived - Microalbumin / creatinine urine ratio - metFORMIN (GLUCOPHAGE) 500 MG tablet; Take 1 tablet  (500 mg total) by mouth 2 (two) times daily with a meal.  Dispense: 180 tablet; Refill: 1  6. Gastroesophageal reflux disease without esophagitis Avoid spicy foods Do not eat 2 hours prior to bedtime - omeprazole (PRILOSEC) 40 MG capsule; Take 1 capsule (40 mg total) by mouth daily.  Dispense: 90 capsule; Refill: 1  7. Recurrent major depressive disorder, in partial remission (HCC) Stress management - FLUoxetine (PROZAC) 40 MG capsule; Take 1 capsule (40 mg total) by mouth 2 (two) times daily.  Dispense: 180 capsule; Refill: 1  8. GAD (generalized anxiety disorder) - busPIRone (BUSPAR) 7.5 MG tablet; Take 1 tablet (7.5 mg total) by mouth daily.  Dispense: 90 tablet; Refill: 2  9. Primary insomnia Bedtime routine - zolpidem (AMBIEN) 10 MG tablet; Take 1 tablet (10 mg total) by mouth at bedtime.  Dispense: 30 tablet; Refill: 2  10. Tobacco user Smoking cessation encouraged  11. Stress incontinence of urine  12. Chronic midline low back pain without sciatica Keep follow up with pain management  13. Morbid obesity ([place]) Discussed diet and exercise for person with BMI >25 Will recheck weight in 3-6 months   14. Acute cough Force fluids Run humidifier Watch blood sugars while on prednisone - predniSONE (DELTASONE) 20 MG tablet; Take 2 tablets (40 mg total) by mouth daily with breakfast for 5 days. 2 po daily for 5 days  Dispense: 10 tablet; Refill: 0   Labs pending Health Maintenance reviewed Diet and exercise encouraged  Follow up plan: 3 months   Mary-Margaret Hassell Done, FNP

## 2021-03-02 NOTE — Patient Instructions (Signed)

## 2021-03-03 LAB — MICROALBUMIN / CREATININE URINE RATIO
Creatinine, Urine: 17.6 mg/dL
Microalb/Creat Ratio: <17 mg/g creat (ref 0–29)
Microalbumin, Urine: <3 ug/mL

## 2021-03-06 ENCOUNTER — Telehealth: Payer: Self-pay | Admitting: Nurse Practitioner

## 2021-03-06 NOTE — Telephone Encounter (Signed)
To soon for another round of steroids

## 2021-03-06 NOTE — Telephone Encounter (Signed)
Pt aware of provider feedback and voiced understanding. 

## 2021-03-16 ENCOUNTER — Encounter (HOSPITAL_COMMUNITY): Payer: Self-pay

## 2021-03-16 NOTE — Progress Notes (Signed)
Attempted to reach patient regarding LCS, unable to reach patient and the VM box is full.

## 2021-03-31 ENCOUNTER — Ambulatory Visit (HOSPITAL_COMMUNITY): Payer: Medicaid Other | Attending: Nurse Practitioner | Admitting: Physical Therapy

## 2021-04-03 ENCOUNTER — Encounter (HOSPITAL_COMMUNITY): Payer: Self-pay

## 2021-04-03 ENCOUNTER — Other Ambulatory Visit (HOSPITAL_COMMUNITY): Payer: Self-pay

## 2021-04-03 DIAGNOSIS — Z122 Encounter for screening for malignant neoplasm of respiratory organs: Secondary | ICD-10-CM

## 2021-04-03 DIAGNOSIS — Z87891 Personal history of nicotine dependence: Secondary | ICD-10-CM

## 2021-04-03 NOTE — Progress Notes (Signed)
Received referral for initial lung cancer screening scan. Contacted patient and obtained smoking history (started age 56, current smoker continuing to smoke 1PPD, 37 pack year) as well as answering questions related to the screening process. Patient denies signs/symptoms of lung cancer such as weight loss or hemoptysis. Patient denies comorbidity that would prevent curative treatment if lung cancer were to be found. Patient is scheduled for shared decision making visit and CT scan on 04/18/2021 at 1100.

## 2021-04-03 NOTE — Progress Notes (Signed)
LDCT order placed per protocol °

## 2021-04-04 NOTE — Progress Notes (Signed)
Reviewed at 10/19/20 OV by Dr. Ellin Saba

## 2021-04-18 ENCOUNTER — Ambulatory Visit (HOSPITAL_COMMUNITY): Payer: Medicaid Other

## 2021-04-18 ENCOUNTER — Ambulatory Visit (HOSPITAL_COMMUNITY): Payer: Medicaid Other | Admitting: Physician Assistant

## 2021-04-19 ENCOUNTER — Ambulatory Visit (HOSPITAL_COMMUNITY): Payer: Medicaid Other | Admitting: Physical Therapy

## 2021-04-21 NOTE — Progress Notes (Signed)
No show for follow up

## 2021-04-25 NOTE — Progress Notes (Deleted)
NO SHOW

## 2021-04-26 ENCOUNTER — Ambulatory Visit (HOSPITAL_COMMUNITY): Admission: RE | Admit: 2021-04-26 | Payer: Medicaid Other | Source: Ambulatory Visit

## 2021-04-26 ENCOUNTER — Ambulatory Visit (INDEPENDENT_AMBULATORY_CARE_PROVIDER_SITE_OTHER): Payer: Medicaid Other | Admitting: Nurse Practitioner

## 2021-04-26 ENCOUNTER — Inpatient Hospital Stay (HOSPITAL_COMMUNITY): Payer: Medicaid Other | Attending: Physician Assistant | Admitting: Physician Assistant

## 2021-04-26 ENCOUNTER — Encounter: Payer: Self-pay | Admitting: Nurse Practitioner

## 2021-04-26 DIAGNOSIS — R52 Pain, unspecified: Secondary | ICD-10-CM | POA: Diagnosis not present

## 2021-04-26 MED ORDER — PREDNISONE 20 MG PO TABS: 40.0000 mg | ORAL_TABLET | Freq: Every day | ORAL | 0 refills | Status: DC

## 2021-04-26 NOTE — Progress Notes (Signed)
? ?  Virtual Visit  Note ?Due to COVID-19 pandemic this visit was conducted virtually. This visit type was conducted due to national recommendations for restrictions regarding the COVID-19 Pandemic (e.g. social distancing, sheltering in place) in an effort to limit this patient's exposure and mitigate transmission in our community. All issues noted in this document were discussed and addressed.  A physical exam was not performed with this format. ? ?I connected with [name] on 04/26/21 at 11:30 am  by telephone and verified that I am speaking with the correct person using two identifiers. Madison Gentry is currently located at home during visit. The provider, Daryll Drown, NP is located in their office at time of visit. ? ?I discussed the limitations, risks, security and privacy concerns of performing an evaluation and management service by telephone and the availability of in person appointments. I also discussed with the patient that there may be a patient responsible charge related to this service. The patient expressed understanding and agreed to proceed. ? ? ?History and Present Illness: ? ?HPI ? ?Patient called for medication refill for prednisone taken very often for body aches. Patient reports symptoms have worsened and she was told by provider to call back for medication refill ? ?Review of Systems  ?Constitutional:  Positive for malaise/fatigue.  ?HENT: Negative.    ?Respiratory: Negative.    ?Cardiovascular: Negative.   ?Skin: Negative.  Negative for rash.  ?Neurological: Negative.   ?All other systems reviewed and are negative. ? ? ?Observations/Objective: ?Tell-visit patient not in distress ? ?Assessment and Plan: ?Patient needed refill on prednisone ? ?Follow Up Instructions: ?Follow up with worsening unresolved ? ?  ?I discussed the assessment and treatment plan with the patient. The patient was provided an opportunity to ask questions and all were answered. The patient agreed with the plan and  demonstrated an understanding of the instructions. ?  ?The patient was advised to call back or seek an in-person evaluation if the symptoms worsen or if the condition fails to improve as anticipated. ? ?The above assessment and management plan was discussed with the patient. The patient verbalized understanding of and has agreed to the management plan. Patient is aware to call the clinic if symptoms persist or worsen. Patient is aware when to return to the clinic for a follow-up visit. Patient educated on when it is appropriate to go to the emergency department.  ? ?Time call ended: 11:36 am  ? ?I provided 6 minutes of  non face-to-face time during this encounter. ? ? ? ?Daryll Drown, NP ?  ?

## 2021-04-28 ENCOUNTER — Ambulatory Visit (HOSPITAL_COMMUNITY): Payer: Medicaid Other

## 2021-05-01 ENCOUNTER — Ambulatory Visit (HOSPITAL_COMMUNITY): Payer: Medicaid Other | Attending: Nurse Practitioner

## 2021-05-01 DIAGNOSIS — M545 Low back pain, unspecified: Secondary | ICD-10-CM | POA: Insufficient documentation

## 2021-05-01 DIAGNOSIS — G8929 Other chronic pain: Secondary | ICD-10-CM | POA: Insufficient documentation

## 2021-05-01 DIAGNOSIS — M6281 Muscle weakness (generalized): Secondary | ICD-10-CM | POA: Insufficient documentation

## 2021-05-30 ENCOUNTER — Ambulatory Visit: Payer: Medicaid Other | Admitting: Nurse Practitioner

## 2021-05-30 ENCOUNTER — Encounter: Payer: Self-pay | Admitting: Nurse Practitioner

## 2021-05-30 VITALS — BP 124/74 | HR 96 | Temp 98.3°F | Ht 62.0 in | Wt 206.0 lb

## 2021-05-30 DIAGNOSIS — J069 Acute upper respiratory infection, unspecified: Secondary | ICD-10-CM

## 2021-05-30 MED ORDER — AZITHROMYCIN 250 MG PO TABS: ORAL_TABLET | ORAL | 0 refills | Status: AC

## 2021-05-30 MED ORDER — GUAIFENESIN ER 600 MG PO TB12: 600.0000 mg | ORAL_TABLET | Freq: Two times a day (BID) | ORAL | 0 refills | Status: DC

## 2021-05-30 MED ORDER — BENZONATATE 100 MG PO CAPS: 100.0000 mg | ORAL_CAPSULE | Freq: Three times a day (TID) | ORAL | 0 refills | Status: DC | PRN

## 2021-05-30 NOTE — Progress Notes (Signed)
? ?Acute Office Visit ? ?Subjective:  ? ? Patient ID: Madison Gentry, female    DOB: [birth year], 56 y.o.   MRN: [id number] ? ?Chief Complaint  ?Patient presents with  ? Cough  ? ? ?Cough ?This is a new problem. The current episode started yesterday. The problem has been gradually worsening. The problem occurs constantly. The cough is Productive of sputum. Associated symptoms include chills and headaches. Pertinent negatives include no chest pain, ear congestion, ear pain, shortness of breath or wheezing. Nothing aggravates the symptoms.  ?URI  ?This is a new problem. The current episode started in the past 7 days. The problem has been gradually worsening. There has been no fever. Associated symptoms include coughing and headaches. Pertinent negatives include no abdominal pain, chest pain, ear pain, sinus pain, sneezing, swollen glands, vomiting or wheezing. She has tried nothing for the symptoms.  ? ? ? ? ?Past Medical History:  ?Diagnosis Date  ? CAD (coronary artery disease)   ? COPD (chronic obstructive pulmonary disease) ([place])   ? Diabetes ([place])   ? Hypertension   ? Stroke [facility])   ? ? ?Past Surgical History:  ?Procedure Laterality Date  ? TUBAL LIGATION    ? ? ?Family History  ?Problem Relation Age of Onset  ? CAD Mother 18  ? CAD Father 23  ?     Died age 42  ? CAD Brother 72  ? ? ?Social History  ? ?Socioeconomic History  ? Marital status: Single  ?  Spouse name: Not on file  ? Number of children: 3  ? Years of education: Not on file  ? Highest education level: Not on file  ?Occupational History  ? Not on file  ?Tobacco Use  ? Smoking status: Every Day  ?  Packs/day: 1.50  ?  Years: 28.00  ?  Pack years: 42.00  ?  Types: Cigarettes  ? Smokeless tobacco: Never  ?Substance and Sexual Activity  ? Alcohol use: Yes  ?  Comment: occas  ? Drug use: No  ? Sexual activity: Yes  ?  Birth control/protection: None  ?Other Topics Concern  ? Not on file  ?Social History Narrative  ? Lives at home with the father of her children.     ? ?Social Determinants of Health  ? ?Financial Resource Strain: Low Risk   ? Difficulty of Paying Living Expenses: Not hard at all  ?Food Insecurity: No Food Insecurity  ? Worried About Charity fundraiser in the Last Year: Never true  ? Ran Out of Food in the Last Year: Never true  ?Transportation Needs: No Transportation Needs  ? Lack of Transportation (Medical): No  ? Lack of Transportation (Non-Medical): No  ?Physical Activity: Inactive  ? Days of Exercise per Week: 0 days  ? Minutes of Exercise per Session: 0 min  ?Stress: Stress Concern Present  ? Feeling of Stress : To some extent  ?Social Connections: Moderately Integrated  ? Frequency of Communication with Friends and Family: More than three times a week  ? Frequency of Social Gatherings with Friends and Family: More than three times a week  ? Attends Religious Services: More than 4 times per year  ? Active Member of Clubs or Organizations: No  ? Attends Archivist Meetings: Never  ? Marital Status: Living with partner  ?Intimate Partner Violence: Not At Risk  ? Fear of Current or Ex-Partner: No  ? Emotionally Abused: No  ? Physically Abused: No  ? Sexually Abused: No  ? ? ?  Outpatient Medications Prior to Visit  ?Medication Sig Dispense Refill  ? ACCU-CHEK GUIDE test strip USE 1 UP TO 4 TIMES DAILY AS DIRECTED    ? Accu-Chek Softclix Lancets lancets SMARTSIG:Topical    ? amLODipine (NORVASC) 10 MG tablet Take 1 tablet (10 mg total) by mouth daily. 90 tablet 1  ? Aspirin-Acetaminophen-Caffeine 520-260-32.5 MG PACK Take 1 packet by mouth 2 (two) times daily as needed. For pain or headache    ? baclofen (LIORESAL) 10 MG tablet Take 10 mg by mouth 3 (three) times daily.    ? blood glucose meter kit and supplies Dispense based on patient and insurance preference. Use up to four times daily as directed. (FOR ICD-10 E10.9, E11.9). 1 each 0  ? busPIRone (BUSPAR) 7.5 MG tablet Take 1 tablet (7.5 mg total) by mouth daily. 90 tablet 2  ? BUTRANS 10 MCG/HR  PTWK 1 patch once a week.    ? cyclobenzaprine (FLEXERIL) 10 MG tablet Take 1 tablet (10 mg total) by mouth 3 (three) times daily. 20 tablet 0  ? diclofenac Sodium (VOLTAREN) 1 % GEL SMARTSIG:1 Application Topical 4 Times Daily PRN    ? ezetimibe (ZETIA) 10 MG tablet Take 1 tablet (10 mg total) by mouth daily. TAKE 1 TABLET BY MOUTH ONCE DAILY 90 tablet 1  ? FLUoxetine (PROZAC) 40 MG capsule Take 1 capsule (40 mg total) by mouth 2 (two) times daily. 180 capsule 1  ? hydrochlorothiazide (HYDRODIURIL) 25 MG tablet Take 1 tablet (25 mg total) by mouth daily. 90 tablet 1  ? HYDROcodone-acetaminophen (NORCO) 10-325 MG tablet Take 1 tablet by mouth 4 (four) times daily as needed.    ? ibuprofen (ADVIL,MOTRIN) 200 MG tablet Take 200 mg by mouth every 6 (six) hours as needed. Takes 800 mg when needed for menstrual cramping    ? Incontinence Supplies KIT Patient needs Pull ups, chuck pads and wipes. 1 kit 0  ? lisinopril (ZESTRIL) 10 MG tablet Take 1 tablet (10 mg total) by mouth daily. 90 tablet 1  ? metFORMIN (GLUCOPHAGE) 500 MG tablet Take 1 tablet (500 mg total) by mouth 2 (two) times daily with a meal. 180 tablet 1  ? nicotine (NICODERM CQ - DOSED IN MG/24 HOURS) 21 mg/24hr patch APPLY ONE PATCH TOPICALLY ONCE DAILY 28 patch 2  ? nicotine polacrilex (NICORETTE) 4 MG gum SMARTSIG:1 Each By Mouth Daily    ? nitroGLYCERIN (NITROSTAT) 0.4 MG SL tablet DISSOLVE ONE TABLET UNDER THE TONGUE EVERY 5 MINUTES AS NEEDED FOR CHEST PAIN.  DO NOT EXCEED A TOTAL OF 3 DOSES IN 15 MINUTES (Patient taking differently: DISSOLVE ONE TABLET UNDER THE TONGUE EVERY 5 MINUTES AS NEEDED FOR CHEST PAIN.  DO NOT EXCEED A TOTAL OF 3 DOSES IN 15 MINUTES) 25 tablet 0  ? omeprazole (PRILOSEC) 40 MG capsule Take 1 capsule (40 mg total) by mouth daily. 90 capsule 1  ? predniSONE (DELTASONE) 20 MG tablet Take 2 tablets (40 mg total) by mouth daily with breakfast. 10 tablet 0  ? silver sulfADIAZINE (SILVADENE) 1 % cream Apply 1 application. topically 2  (two) times daily.    ? simvastatin (ZOCOR) 40 MG tablet Take 1 tablet (40 mg total) by mouth daily at 6 PM. 90 tablet 1  ? SYMBICORT 80-4.5 MCG/ACT inhaler Inhale 2 puffs into the lungs 2 (two) times daily. 10.2 g 3  ? traMADol (ULTRAM-ER) 100 MG 24 hr tablet Take 100 mg by mouth daily.    ? zolpidem (AMBIEN) 10 MG tablet  Take 1 tablet (10 mg total) by mouth at bedtime. 30 tablet 2  ? ?No facility-administered medications prior to visit.  ? ? ?No Known Allergies ? ?Review of Systems  ?Constitutional:  Positive for chills.  ?HENT:  Negative for ear pain, sinus pressure, sinus pain and sneezing.   ?Respiratory:  Positive for cough. Negative for shortness of breath and wheezing.   ?Cardiovascular: Negative.  Negative for chest pain.  ?Gastrointestinal:  Negative for abdominal pain and vomiting.  ?Neurological:  Positive for headaches.  ?All other systems reviewed and are negative. ? ?   ?Objective:  ?  ?Physical Exam ?Vitals and nursing note reviewed.  ?Constitutional:   ?   Appearance: Normal appearance.  ?HENT:  ?   Head: Normocephalic.  ?   Right Ear: External ear normal.  ?   Left Ear: External ear normal.  ?   Nose: Congestion present.  ?   Mouth/Throat:  ?   Mouth: Mucous membranes are moist.  ?   Pharynx: Oropharynx is clear.  ?Eyes:  ?   Conjunctiva/sclera: Conjunctivae normal.  ?Cardiovascular:  ?   Rate and Rhythm: Normal rate and regular rhythm.  ?   Pulses: Normal pulses.  ?   Heart sounds: Normal heart sounds.  ?Pulmonary:  ?   Effort: Pulmonary effort is normal.  ?   Breath sounds: Normal breath sounds.  ?Abdominal:  ?   General: Bowel sounds are normal.  ?Skin: ?   Findings: No rash.  ?Neurological:  ?   Mental Status: She is alert and oriented to person, place, and time.  ?Psychiatric:     ?   Behavior: Behavior normal.  ? ? ?BP 124/74   Pulse 96   Temp 98.3 ?F (36.8 ?C)   Ht $R'5\' 2"'im$  (1.575 m)   Wt 206 lb (93.4 kg)   SpO2 93%   BMI 37.68 kg/m?  ?Wt Readings from Last 3 Encounters:  ?05/30/21 206 lb  (93.4 kg)  ?03/02/21 209 lb (94.8 kg)  ?11/25/20 211 lb (95.7 kg)  ? ? ?Health Maintenance Due  ?Topic Date Due  ? OPHTHALMOLOGY EXAM  Never done  ? PAP SMEAR-Modifier  Never done  ? COVID-19 Vaccine (3 -

## 2021-05-30 NOTE — Patient Instructions (Signed)

## 2021-05-31 ENCOUNTER — Telehealth: Payer: Self-pay | Admitting: Nurse Practitioner

## 2021-05-31 NOTE — Telephone Encounter (Signed)
So sorry I did not see patient or evaluate patient for back pain.  So I cannot refill prednisone.  If symptoms are not improving patient may need to be reevaluated.  ?

## 2021-05-31 NOTE — Telephone Encounter (Signed)
Patient aware.

## 2021-05-31 NOTE — Telephone Encounter (Signed)
Seen Je please advise  

## 2021-05-31 NOTE — Telephone Encounter (Signed)
Pt calling to check up on this message. ?

## 2021-06-01 ENCOUNTER — Ambulatory Visit (INDEPENDENT_AMBULATORY_CARE_PROVIDER_SITE_OTHER): Payer: Medicaid Other

## 2021-06-01 ENCOUNTER — Encounter: Payer: Self-pay | Admitting: Family Medicine

## 2021-06-01 ENCOUNTER — Ambulatory Visit: Payer: Medicaid Other | Admitting: Family Medicine

## 2021-06-01 VITALS — BP 125/80 | HR 103 | Temp 98.1°F | Ht 62.0 in | Wt 205.0 lb

## 2021-06-01 DIAGNOSIS — B3731 Acute candidiasis of vulva and vagina: Secondary | ICD-10-CM

## 2021-06-01 DIAGNOSIS — R062 Wheezing: Secondary | ICD-10-CM | POA: Diagnosis not present

## 2021-06-01 DIAGNOSIS — J449 Chronic obstructive pulmonary disease, unspecified: Secondary | ICD-10-CM | POA: Diagnosis not present

## 2021-06-01 DIAGNOSIS — J069 Acute upper respiratory infection, unspecified: Secondary | ICD-10-CM | POA: Diagnosis not present

## 2021-06-01 DIAGNOSIS — J441 Chronic obstructive pulmonary disease with (acute) exacerbation: Secondary | ICD-10-CM

## 2021-06-01 MED ORDER — FLUCONAZOLE 150 MG PO TABS: 150.0000 mg | ORAL_TABLET | Freq: Once | ORAL | 0 refills | Status: AC

## 2021-06-01 MED ORDER — PREDNISONE 20 MG PO TABS: 40.0000 mg | ORAL_TABLET | Freq: Every day | ORAL | 0 refills | Status: DC

## 2021-06-01 MED ORDER — ALBUTEROL SULFATE HFA 108 (90 BASE) MCG/ACT IN AERS: 2.0000 | INHALATION_SPRAY | Freq: Four times a day (QID) | RESPIRATORY_TRACT | 2 refills | Status: DC | PRN

## 2021-06-01 NOTE — Patient Instructions (Signed)

## 2021-06-01 NOTE — Progress Notes (Signed)
? ?Acute Office Visit ? ?Subjective:  ? ? Patient ID: Madison Gentry, female    DOB: 1965/03/13, 56 y.o.   MRN: [id number] ? ?Chief Complaint  ?Patient presents with  ? Cough  ? ? ?HPI ?Patient is in today for cough, congestion, headache, sinus pressure, and fatigue for 2 weeks. She is now starting to feel short of breath with activity. Her cough is sometimes productive. She has been taking tessalon perles, mucinex, nyquil, dayquil, and alka seltzer cold plus without improvement. She was seen 2 days ago and started on a zpak. Symptoms have been unchanged to a little worse. She denies chest pain or fever.  ? ?Past Medical History:  ?Diagnosis Date  ? CAD (coronary artery disease)   ? COPD (chronic obstructive pulmonary disease) ([place])   ? Diabetes ([place])   ? Hypertension   ? Stroke [facility])   ? ? ?Past Surgical History:  ?Procedure Laterality Date  ? TUBAL LIGATION    ? ? ?Family History  ?Problem Relation Age of Onset  ? CAD Mother 5  ? CAD Father 65  ?     Died age 53  ? CAD Brother 96  ? ? ?Social History  ? ?Socioeconomic History  ? Marital status: Single  ?  Spouse name: Not on file  ? Number of children: 3  ? Years of education: Not on file  ? Highest education level: Not on file  ?Occupational History  ? Not on file  ?Tobacco Use  ? Smoking status: Every Day  ?  Packs/day: 1.50  ?  Years: 28.00  ?  Pack years: 42.00  ?  Types: Cigarettes  ? Smokeless tobacco: Never  ?Substance and Sexual Activity  ? Alcohol use: Yes  ?  Comment: occas  ? Drug use: No  ? Sexual activity: Yes  ?  Birth control/protection: None  ?Other Topics Concern  ? Not on file  ?Social History Narrative  ? Lives at home with the father of her children.    ? ?Social Determinants of Health  ? ?Financial Resource Strain: Low Risk   ? Difficulty of Paying Living Expenses: Not hard at all  ?Food Insecurity: No Food Insecurity  ? Worried About Charity fundraiser in the Last Year: Never true  ? Ran Out of Food in the Last Year: Never true  ?Transportation  Needs: No Transportation Needs  ? Lack of Transportation (Medical): No  ? Lack of Transportation (Non-Medical): No  ?Physical Activity: Inactive  ? Days of Exercise per Week: 0 days  ? Minutes of Exercise per Session: 0 min  ?Stress: Stress Concern Present  ? Feeling of Stress : To some extent  ?Social Connections: Moderately Integrated  ? Frequency of Communication with Friends and Family: More than three times a week  ? Frequency of Social Gatherings with Friends and Family: More than three times a week  ? Attends Religious Services: More than 4 times per year  ? Active Member of Clubs or Organizations: No  ? Attends Archivist Meetings: Never  ? Marital Status: Living with partner  ?Intimate Partner Violence: Not At Risk  ? Fear of Current or Ex-Partner: No  ? Emotionally Abused: No  ? Physically Abused: No  ? Sexually Abused: No  ? ? ?Outpatient Medications Prior to Visit  ?Medication Sig Dispense Refill  ? ACCU-CHEK GUIDE test strip USE 1 UP TO 4 TIMES DAILY AS DIRECTED    ? Accu-Chek Softclix Lancets lancets SMARTSIG:Topical    ?  amLODipine (NORVASC) 10 MG tablet Take 1 tablet (10 mg total) by mouth daily. 90 tablet 1  ? Aspirin-Acetaminophen-Caffeine 520-260-32.5 MG PACK Take 1 packet by mouth 2 (two) times daily as needed. For pain or headache    ? azithromycin (ZITHROMAX) 250 MG tablet Take 2 tablets on day 1, then 1 tablet daily on days 2 through 5 6 tablet 0  ? baclofen (LIORESAL) 10 MG tablet Take 10 mg by mouth 3 (three) times daily.    ? benzonatate (TESSALON PERLES) 100 MG capsule Take 1 capsule (100 mg total) by mouth 3 (three) times daily as needed. 20 capsule 0  ? blood glucose meter kit and supplies Dispense based on patient and insurance preference. Use up to four times daily as directed. (FOR ICD-10 E10.9, E11.9). 1 each 0  ? busPIRone (BUSPAR) 7.5 MG tablet Take 1 tablet (7.5 mg total) by mouth daily. 90 tablet 2  ? cyclobenzaprine (FLEXERIL) 10 MG tablet Take 1 tablet (10 mg total)  by mouth 3 (three) times daily. 20 tablet 0  ? diclofenac Sodium (VOLTAREN) 1 % GEL SMARTSIG:1 Application Topical 4 Times Daily PRN    ? ezetimibe (ZETIA) 10 MG tablet Take 1 tablet (10 mg total) by mouth daily. TAKE 1 TABLET BY MOUTH ONCE DAILY 90 tablet 1  ? FLUoxetine (PROZAC) 40 MG capsule Take 1 capsule (40 mg total) by mouth 2 (two) times daily. 180 capsule 1  ? guaiFENesin (MUCINEX) 600 MG 12 hr tablet Take 1 tablet (600 mg total) by mouth 2 (two) times daily. 30 tablet 0  ? hydrochlorothiazide (HYDRODIURIL) 25 MG tablet Take 1 tablet (25 mg total) by mouth daily. 90 tablet 1  ? HYDROcodone-acetaminophen (NORCO) 10-325 MG tablet Take 1 tablet by mouth 4 (four) times daily as needed.    ? ibuprofen (ADVIL,MOTRIN) 200 MG tablet Take 200 mg by mouth every 6 (six) hours as needed. Takes 800 mg when needed for menstrual cramping    ? Incontinence Supplies KIT Patient needs Pull ups, chuck pads and wipes. 1 kit 0  ? lisinopril (ZESTRIL) 10 MG tablet Take 1 tablet (10 mg total) by mouth daily. 90 tablet 1  ? metFORMIN (GLUCOPHAGE) 500 MG tablet Take 1 tablet (500 mg total) by mouth 2 (two) times daily with a meal. 180 tablet 1  ? nicotine (NICODERM CQ - DOSED IN MG/24 HOURS) 21 mg/24hr patch APPLY ONE PATCH TOPICALLY ONCE DAILY 28 patch 2  ? nitroGLYCERIN (NITROSTAT) 0.4 MG SL tablet DISSOLVE ONE TABLET UNDER THE TONGUE EVERY 5 MINUTES AS NEEDED FOR CHEST PAIN.  DO NOT EXCEED A TOTAL OF 3 DOSES IN 15 MINUTES (Patient taking differently: DISSOLVE ONE TABLET UNDER THE TONGUE EVERY 5 MINUTES AS NEEDED FOR CHEST PAIN.  DO NOT EXCEED A TOTAL OF 3 DOSES IN 15 MINUTES) 25 tablet 0  ? omeprazole (PRILOSEC) 40 MG capsule Take 1 capsule (40 mg total) by mouth daily. 90 capsule 1  ? predniSONE (DELTASONE) 20 MG tablet Take 2 tablets (40 mg total) by mouth daily with breakfast. 10 tablet 0  ? silver sulfADIAZINE (SILVADENE) 1 % cream Apply 1 application. topically 2 (two) times daily.    ? simvastatin (ZOCOR) 40 MG tablet Take  1 tablet (40 mg total) by mouth daily at 6 PM. 90 tablet 1  ? SYMBICORT 80-4.5 MCG/ACT inhaler Inhale 2 puffs into the lungs 2 (two) times daily. 10.2 g 3  ? traMADol (ULTRAM-ER) 100 MG 24 hr tablet Take 100 mg by mouth daily.    ?  zolpidem (AMBIEN) 10 MG tablet Take 1 tablet (10 mg total) by mouth at bedtime. 30 tablet 2  ? BUTRANS 10 MCG/HR PTWK 1 patch once a week.    ? nicotine polacrilex (NICORETTE) 4 MG gum SMARTSIG:1 Each By Mouth Daily    ? ?No facility-administered medications prior to visit.  ? ? ?No Known Allergies ? ?Review of Systems ?As per HPI.  ?   ?Objective:  ?  ?Physical Exam ?Vitals and nursing note reviewed.  ?Constitutional:   ?   General: She is not in acute distress. ?   Appearance: She is not toxic-appearing or diaphoretic.  ?HENT:  ?   Head: Normocephalic and atraumatic.  ?   Nose: Congestion present.  ?   Mouth/Throat:  ?   Mouth: Mucous membranes are moist.  ?   Pharynx: Oropharynx is clear. No oropharyngeal exudate or posterior oropharyngeal erythema.  ?Eyes:  ?   Conjunctiva/sclera: Conjunctivae normal.  ?   Pupils: Pupils are equal, round, and reactive to light.  ?Cardiovascular:  ?   Rate and Rhythm: Normal rate and regular rhythm.  ?   Heart sounds: Normal heart sounds. No murmur heard. ?Pulmonary:  ?   Effort: No respiratory distress.  ?   Breath sounds: Examination of the right-upper field reveals decreased breath sounds and wheezing. Examination of the left-upper field reveals decreased breath sounds and wheezing. Examination of the right-middle field reveals decreased breath sounds. Examination of the left-middle field reveals decreased breath sounds. Examination of the right-lower field reveals decreased breath sounds. Examination of the left-lower field reveals decreased breath sounds. Decreased breath sounds and wheezing present.  ?Musculoskeletal:  ?   Right lower leg: No edema.  ?   Left lower leg: No edema.  ?Skin: ?   General: Skin is warm and dry.  ?Neurological:  ?    General: No focal deficit present.  ?   Mental Status: She is alert and oriented to person, place, and time.  ?Psychiatric:     ?   Mood and Affect: Mood normal.     ?   Behavior: Behavior normal.  ? ? ?

## 2021-06-05 ENCOUNTER — Encounter: Payer: Self-pay | Admitting: Nurse Practitioner

## 2021-06-05 ENCOUNTER — Ambulatory Visit: Payer: Medicaid Other | Admitting: Nurse Practitioner

## 2021-06-06 ENCOUNTER — Ambulatory Visit: Payer: Medicaid Other | Admitting: Nurse Practitioner

## 2021-06-14 ENCOUNTER — Ambulatory Visit: Payer: Medicaid Other | Admitting: Nurse Practitioner

## 2021-06-14 NOTE — Progress Notes (Deleted)
Subjective:    Patient ID: Madison Gentry, female    DOB: 11-19-1965, 56 y.o.   MRN: [id number]  Chief Complaint: medical management of chronic issues     HPI:  Madison Gentry is a 56 y.o. who identifies as a female who was assigned female at birth.   Social history: Lives with: husband and kids Work history: disability   Comes in today for follow up of the following chronic medical issues:  1. Primary hypertension No c/o chest pain, sob or headache. Does not check blood pressure at home. BP Readings from Last 3 Encounters:  06/01/21 125/80  05/30/21 124/74  03/02/21 121/78     2. Pure hypercholesterolemia Does not watch diet and does no exercise. Lab Results  Component Value Date   CHOL 122 03/02/2021   HDL 52 03/02/2021   LDLCALC 49 03/02/2021   TRIG 121 03/02/2021   CHOLHDL 2.3 03/02/2021   The ASCVD Risk score (Arnett DK, et al., 2019) failed to calculate for the following reasons:   The valid total cholesterol range is 130 to 320 mg/dL   3. Type 2 diabetes mellitus with diabetic polyneuropathy, without long-term current use of insulin (HCC) ***  4. Mucopurulent chronic bronchitis (HCC) Has chronic cough. Mainly from smoking. She is on symbicort daily.  5. Gastroesophageal reflux disease without esophagitis Take omperazle daily and hat seems to work well for hher.  6. Stress incontinence of urine She uses incontinent supplies daily.  7. Primary insomnia Is on ambien to sleep. Sleeps aout 7-8  hours a night  8. GAD (generalized anxiety disorder) ***  9. Recurrent major depressive disorder, in partial remission (HCC) ***  10. Tobacco user ***  11. Morbid obesity (HCC) ***   New complaints: ***  No Known Allergies Outpatient Encounter Medications as of 06/14/2021  Medication Sig   ACCU-CHEK GUIDE test strip USE 1 UP TO 4 TIMES DAILY AS DIRECTED   Accu-Chek Softclix Lancets lancets SMARTSIG:Topical   albuterol (VENTOLIN HFA) 108 (90 Base) MCG/ACT  inhaler Inhale 2 puffs into the lungs every 6 (six) hours as needed for wheezing or shortness of breath.   amLODipine (NORVASC) 10 MG tablet Take 1 tablet (10 mg total) by mouth daily.   Aspirin-Acetaminophen-Caffeine 520-260-32.5 MG PACK Take 1 packet by mouth 2 (two) times daily as needed. For pain or headache   baclofen (LIORESAL) 10 MG tablet Take 10 mg by mouth 3 (three) times daily.   benzonatate (TESSALON PERLES) 100 MG capsule Take 1 capsule (100 mg total) by mouth 3 (three) times daily as needed.   blood glucose meter kit and supplies Dispense based on patient and insurance preference. Use up to four times daily as directed. (FOR ICD-10 E10.9, E11.9).   busPIRone (BUSPAR) 7.5 MG tablet Take 1 tablet (7.5 mg total) by mouth daily.   cyclobenzaprine (FLEXERIL) 10 MG tablet Take 1 tablet (10 mg total) by mouth 3 (three) times daily.   diclofenac Sodium (VOLTAREN) 1 % GEL SMARTSIG:1 Application Topical 4 Times Daily PRN   ezetimibe (ZETIA) 10 MG tablet Take 1 tablet (10 mg total) by mouth daily. TAKE 1 TABLET BY MOUTH ONCE DAILY   FLUoxetine (PROZAC) 40 MG capsule Take 1 capsule (40 mg total) by mouth 2 (two) times daily.   guaiFENesin (MUCINEX) 600 MG 12 hr tablet Take 1 tablet (600 mg total) by mouth 2 (two) times daily.   hydrochlorothiazide (HYDRODIURIL) 25 MG tablet Take 1 tablet (25 mg total) by mouth daily.   HYDROcodone-acetaminophen ([place])  10-325 MG tablet Take 1 tablet by mouth 4 (four) times daily as needed.   ibuprofen (ADVIL,MOTRIN) 200 MG tablet Take 200 mg by mouth every 6 (six) hours as needed. Takes 800 mg when needed for menstrual cramping   Incontinence Supplies KIT Patient needs Pull ups, chuck pads and wipes.   lisinopril (ZESTRIL) 10 MG tablet Take 1 tablet (10 mg total) by mouth daily.   metFORMIN (GLUCOPHAGE) 500 MG tablet Take 1 tablet (500 mg total) by mouth 2 (two) times daily with a meal.   nicotine (NICODERM CQ - DOSED IN MG/24 HOURS) 21 mg/24hr patch APPLY ONE  PATCH TOPICALLY ONCE DAILY   nitroGLYCERIN (NITROSTAT) 0.4 MG SL tablet DISSOLVE ONE TABLET UNDER THE TONGUE EVERY 5 MINUTES AS NEEDED FOR CHEST PAIN.  DO NOT EXCEED A TOTAL OF 3 DOSES IN 15 MINUTES (Patient taking differently: DISSOLVE ONE TABLET UNDER THE TONGUE EVERY 5 MINUTES AS NEEDED FOR CHEST PAIN.  DO NOT EXCEED A TOTAL OF 3 DOSES IN 15 MINUTES)   omeprazole (PRILOSEC) 40 MG capsule Take 1 capsule (40 mg total) by mouth daily.   predniSONE (DELTASONE) 20 MG tablet Take 2 tablets (40 mg total) by mouth daily with breakfast.   silver sulfADIAZINE (SILVADENE) 1 % cream Apply 1 application. topically 2 (two) times daily.   simvastatin (ZOCOR) 40 MG tablet Take 1 tablet (40 mg total) by mouth daily at 6 PM.   SYMBICORT 80-4.5 MCG/ACT inhaler Inhale 2 puffs into the lungs 2 (two) times daily.   traMADol (ULTRAM-ER) 100 MG 24 hr tablet Take 100 mg by mouth daily.   zolpidem (AMBIEN) 10 MG tablet Take 1 tablet (10 mg total) by mouth at bedtime.   No facility-administered encounter medications on file as of 06/14/2021.    Past Surgical History:  Procedure Laterality Date   TUBAL LIGATION      Family History  Problem Relation Age of Onset   CAD Mother 44   CAD Father 63       Died age 59   CAD Brother 80      Controlled substance contract: ***     Review of Systems     Objective:   Physical Exam        Assessment & Plan:

## 2021-06-21 ENCOUNTER — Encounter: Payer: Self-pay | Admitting: Nurse Practitioner

## 2021-06-26 ENCOUNTER — Other Ambulatory Visit: Payer: Self-pay | Admitting: Nurse Practitioner

## 2021-06-26 DIAGNOSIS — F5101 Primary insomnia: Secondary | ICD-10-CM

## 2021-06-26 NOTE — Telephone Encounter (Signed)
Controlled - ntbs  ?

## 2021-06-26 NOTE — Telephone Encounter (Signed)
Pt has apt 06/29/2021 ?

## 2021-06-29 ENCOUNTER — Ambulatory Visit: Payer: Medicaid Other | Admitting: Nurse Practitioner

## 2021-06-29 ENCOUNTER — Telehealth: Payer: Self-pay | Admitting: Nurse Practitioner

## 2021-06-29 ENCOUNTER — Encounter: Payer: Self-pay | Admitting: Nurse Practitioner

## 2021-06-29 VITALS — BP 108/69 | HR 85 | Temp 97.9°F | Resp 20 | Ht 62.0 in | Wt 207.0 lb

## 2021-06-29 DIAGNOSIS — E7800 Pure hypercholesterolemia, unspecified: Secondary | ICD-10-CM

## 2021-06-29 DIAGNOSIS — F5101 Primary insomnia: Secondary | ICD-10-CM | POA: Diagnosis not present

## 2021-06-29 DIAGNOSIS — I25119 Atherosclerotic heart disease of native coronary artery with unspecified angina pectoris: Secondary | ICD-10-CM

## 2021-06-29 DIAGNOSIS — M545 Low back pain, unspecified: Secondary | ICD-10-CM

## 2021-06-29 DIAGNOSIS — F3341 Major depressive disorder, recurrent, in partial remission: Secondary | ICD-10-CM

## 2021-06-29 DIAGNOSIS — F411 Generalized anxiety disorder: Secondary | ICD-10-CM

## 2021-06-29 DIAGNOSIS — I10 Essential (primary) hypertension: Secondary | ICD-10-CM | POA: Diagnosis not present

## 2021-06-29 DIAGNOSIS — Z720 Tobacco use: Secondary | ICD-10-CM

## 2021-06-29 DIAGNOSIS — J069 Acute upper respiratory infection, unspecified: Secondary | ICD-10-CM

## 2021-06-29 DIAGNOSIS — E1142 Type 2 diabetes mellitus with diabetic polyneuropathy: Secondary | ICD-10-CM | POA: Diagnosis not present

## 2021-06-29 DIAGNOSIS — G8929 Other chronic pain: Secondary | ICD-10-CM

## 2021-06-29 DIAGNOSIS — J411 Mucopurulent chronic bronchitis: Secondary | ICD-10-CM

## 2021-06-29 DIAGNOSIS — K219 Gastro-esophageal reflux disease without esophagitis: Secondary | ICD-10-CM

## 2021-06-29 DIAGNOSIS — N393 Stress incontinence (female) (male): Secondary | ICD-10-CM

## 2021-06-29 LAB — BAYER DCA HB A1C WAIVED: HB A1C (BAYER DCA - WAIVED): 7.2 % — ABNORMAL HIGH (ref 4.8–5.6)

## 2021-06-29 MED ORDER — BENZONATATE 100 MG PO CAPS: 100.0000 mg | ORAL_CAPSULE | Freq: Three times a day (TID) | ORAL | 0 refills | Status: DC | PRN

## 2021-06-29 MED ORDER — OZEMPIC (0.25 OR 0.5 MG/DOSE) 2 MG/1.5ML ~~LOC~~ SOPN: 0.5000 mg | PEN_INJECTOR | SUBCUTANEOUS | 2 refills | Status: DC

## 2021-06-29 MED ORDER — SYMBICORT 80-4.5 MCG/ACT IN AERO: 2.0000 | INHALATION_SPRAY | Freq: Two times a day (BID) | RESPIRATORY_TRACT | 3 refills | Status: DC

## 2021-06-29 MED ORDER — LISINOPRIL 10 MG PO TABS: 10.0000 mg | ORAL_TABLET | Freq: Every day | ORAL | 1 refills | Status: DC

## 2021-06-29 MED ORDER — BUSPIRONE HCL 7.5 MG PO TABS: 7.5000 mg | ORAL_TABLET | Freq: Every day | ORAL | 2 refills | Status: DC

## 2021-06-29 MED ORDER — FLUOXETINE HCL 40 MG PO CAPS: 40.0000 mg | ORAL_CAPSULE | Freq: Two times a day (BID) | ORAL | 1 refills | Status: DC

## 2021-06-29 MED ORDER — OMEPRAZOLE 40 MG PO CPDR: 40.0000 mg | DELAYED_RELEASE_CAPSULE | Freq: Every day | ORAL | 1 refills | Status: DC

## 2021-06-29 MED ORDER — EZETIMIBE 10 MG PO TABS: 10.0000 mg | ORAL_TABLET | Freq: Every day | ORAL | 1 refills | Status: DC

## 2021-06-29 MED ORDER — ZOLPIDEM TARTRATE 10 MG PO TABS: 10.0000 mg | ORAL_TABLET | Freq: Every day | ORAL | 2 refills | Status: DC

## 2021-06-29 MED ORDER — HYDROCHLOROTHIAZIDE 25 MG PO TABS: 25.0000 mg | ORAL_TABLET | Freq: Every day | ORAL | 1 refills | Status: DC

## 2021-06-29 MED ORDER — PREDNISONE 10 MG (21) PO TBPK: ORAL_TABLET | ORAL | 0 refills | Status: DC

## 2021-06-29 MED ORDER — AMLODIPINE BESYLATE 10 MG PO TABS: 10.0000 mg | ORAL_TABLET | Freq: Every day | ORAL | 1 refills | Status: DC

## 2021-06-29 MED ORDER — METFORMIN HCL 500 MG PO TABS: 500.0000 mg | ORAL_TABLET | Freq: Two times a day (BID) | ORAL | 1 refills | Status: DC

## 2021-06-29 NOTE — Telephone Encounter (Signed)
Tessalon perles filled ?

## 2021-06-29 NOTE — Progress Notes (Signed)
? ?Subjective:  ? ? Patient ID: Madison Gentry, female    DOB: [birth year], 56 y.o.   MRN: [id number] ? ? ?Chief Complaint: medical management of chronic issues  ?  ? ?HPI: ? ?Madison Gentry is a 55 y.o. who identifies as a female who was assigned female at birth.  ? ?Social history: ?Lives with: husband and children ?Work history: disability ? ? ?Comes in today for follow up of the following chronic medical issues: ? ?1. Primary hypertension ?No c/o chest pain, sob or headache. Doe snot check blood pressure at home. ?BP Readings from Last 3 Encounters:  ?06/01/21 125/80  ?05/30/21 124/74  ?03/02/21 121/78  ? ? ?2. Pure hypercholesterolemia ?Does not watch diet and does  no dedicated exercise. ?Lab Results  ?Component Value Date  ? CHOL 122 03/02/2021  ? HDL 52 03/02/2021  ? LDLCALC 49 03/02/2021  ? TRIG 121 03/02/2021  ? CHOLHDL 2.3 03/02/2021  ? ?The ASCVD Risk score (Arnett DK, et al., 2019) failed to calculate for the following reasons: ?  The valid total cholesterol range is 130 to 320 mg/dL ? ? ?3. Type 2 diabetes mellitus with diabetic polyneuropathy, without long-term current use of insulin ([place]) ?She does not check her blood sugars at home.  ?Lab Results  ?Component Value Date  ? HGBA1C 7.4 (H) 03/02/2021  ? ? ? ?4. Primary insomnia ?Is on ambien to sleep at night. Sleeps about 7-8 hours a night ? ?5. Atherosclerosis of native coronary artery of native heart with angina pectoris ([place]) ?Has not seen cardiology ? ?6. Mucopurulent chronic bronchitis ([place]) ?I son symbicort occasionally. Uses albuterol several times a week, ? ?7. Gastroesophageal reflux disease without esophagitis ?Is on omeprazole daily and is doing well. ? ?8. Tobacco user ?Smokes over a pack a day. Low dose CT scan was ordered but has not been done. ? ?9. GAD (generalized anxiety disorder) ?Is on buspar and takes at least 1x a day. ? ?  06/29/2021  ?  8:58 AM 06/01/2021  ?  8:38 AM 05/30/2021  ?  4:07 PM 03/02/2021  ?  8:39 AM  ?GAD 7 : Generalized Anxiety  Score  ?Nervous, Anxious, on Edge 0 _0 ?Control/stop worrying 0 _1 ?Worry too much - different things _2 ?Trouble relaxing _3 ?Restless 0 2 0 0  ?Easily annoyed or irritable _4 ?Afraid - awful might happen 0 0 0 1  ?Total GAD 7 Score _5 ?Anxiety Difficulty  Extremely difficult  Very difficult  ? ? ? ? ?10. Recurrent major depressive disorder, in partial remission ([place]) ?Has been on prozac several years and is doing well. ? ?  06/29/2021  ?  8:58 AM 06/01/2021  ?  8:38 AM 05/30/2021  ?  4:07 PM  ?Depression screen PHQ 2/9  ?Decreased Interest _6 ?Down, Depressed, Hopeless 2 2 0  ?PHQ - 2 Score _7 ?Altered sleeping _8 ?Tired, decreased energy _9 ?Change in appetite _10 ?Feeling bad or failure about yourself  0 0 2  ?Trouble concentrating 0 0 0  ?Moving slowly or fidgety/restless 0 0 0  ?Suicidal thoughts 0 0 0  ?PHQ-9 Score _11 ?Difficult doing work/chores Not difficult at all Extremely dIfficult Not difficult at all  ? ? ? ?11. Stress incontinence of urine ?Uses depends and  chucks pads ? ?12. Chronic midline low back pain without sciatica ?Pain is radiating down her left leg. She sees pain management. ? ?13. Morbid obesity ([place]) ?No recent weight changes ?Wt Readings from Last 3 Encounters:  ?06/29/21 207 lb (93.9 kg)  ?06/01/21 205 lb (93 kg)  ?05/30/21 206 lb (93.4 kg)  ? ?BMI Readings from Last 3 Encounters:  ?06/29/21 37.86 kg/m?  ?06/01/21 37.49 kg/m?  ?05/30/21 37.68 kg/m?  ? ? ? ? ?New complaints: ?None today ? ?No Known Allergies ?Outpatient Encounter Medications as of 06/29/2021  ?Medication Sig  ? ACCU-CHEK GUIDE test strip USE 1 UP TO 4 TIMES DAILY AS DIRECTED  ? Accu-Chek Softclix Lancets lancets SMARTSIG:Topical  ? albuterol (VENTOLIN HFA) 108 (90 Base) MCG/ACT inhaler Inhale 2 puffs into the lungs every 6 (six) hours as needed for wheezing or shortness of breath.  ? amLODipine (NORVASC) 10 MG tablet Take 1 tablet (10 mg total) by mouth daily.  ?  Aspirin-Acetaminophen-Caffeine 520-260-32.5 MG PACK Take 1 packet by mouth 2 (two) times daily as needed. For pain or headache  ? baclofen (LIORESAL) 10 MG tablet Take 10 mg by mouth 3 (three) times daily.  ? benzonatate (TESSALON PERLES) 100 MG capsule Take 1 capsule (100 mg total) by mouth 3 (three) times daily as needed.  ? blood glucose meter kit and supplies Dispense based on patient and insurance preference. Use up to four times daily as directed. (FOR ICD-10 E10.9, E11.9).  ? busPIRone (BUSPAR) 7.5 MG tablet Take 1 tablet (7.5 mg total) by mouth daily.  ? cyclobenzaprine (FLEXERIL) 10 MG tablet Take 1 tablet (10 mg total) by mouth 3 (three) times daily.  ? diclofenac Sodium (VOLTAREN) 1 % GEL SMARTSIG:1 Application Topical 4 Times Daily PRN  ? ezetimibe (ZETIA) 10 MG tablet Take 1 tablet (10 mg total) by mouth daily. TAKE 1 TABLET BY MOUTH ONCE DAILY  ? FLUoxetine (PROZAC) 40 MG capsule Take 1 capsule (40 mg total) by mouth 2 (two) times daily.  ? guaiFENesin (MUCINEX) 600 MG 12 hr tablet Take 1 tablet (600 mg total) by mouth 2 (two) times daily.  ? hydrochlorothiazide (HYDRODIURIL) 25 MG tablet Take 1 tablet (25 mg total) by mouth daily.  ? HYDROcodone-acetaminophen ([place]) 10-325 MG tablet Take 1 tablet by mouth 4 (four) times daily as needed.  ? ibuprofen (ADVIL,MOTRIN) 200 MG tablet Take 200 mg by mouth every 6 (six) hours as needed. Takes 800 mg when needed for menstrual cramping  ? Incontinence Supplies KIT Patient needs Pull ups, chuck pads and wipes.  ? lisinopril (ZESTRIL) 10 MG tablet Take 1 tablet (10 mg total) by mouth daily.  ? metFORMIN (GLUCOPHAGE) 500 MG tablet Take 1 tablet (500 mg total) by mouth 2 (two) times daily with a meal.  ? nicotine (NICODERM CQ - DOSED IN MG/24 HOURS) 21 mg/24hr patch APPLY ONE PATCH TOPICALLY ONCE DAILY  ? nitroGLYCERIN (NITROSTAT) 0.4 MG SL tablet DISSOLVE ONE TABLET UNDER THE TONGUE EVERY 5 MINUTES AS NEEDED FOR CHEST PAIN.  DO NOT EXCEED A TOTAL OF 3 DOSES IN 15  MINUTES (Patient taking differently: DISSOLVE ONE TABLET UNDER THE TONGUE EVERY 5 MINUTES AS NEEDED FOR CHEST PAIN.  DO NOT EXCEED A TOTAL OF 3 DOSES IN 15 MINUTES)  ? omeprazole (PRILOSEC) 40 MG capsule Take 1 capsule (40 mg total) by mouth daily.  ? silver sulfADIAZINE (SILVADENE) 1 % cream Apply 1 application. topically 2 (two) times daily.  ? simvastatin (ZOCOR) 40 MG tablet Take 1 tablet (40  mg total) by mouth daily at 6 PM.  ? SYMBICORT 80-4.5 MCG/ACT inhaler Inhale 2 puffs into the lungs 2 (two) times daily.  ? traMADol (ULTRAM-ER) 100 MG 24 hr tablet Take 100 mg by mouth daily.  ? zolpidem (AMBIEN) 10 MG tablet Take 1 tablet (10 mg total) by mouth at bedtime.  ? ?No facility-administered encounter medications on file as of 06/29/2021.  ? ? ?Past Surgical History:  ?Procedure Laterality Date  ? TUBAL LIGATION    ? ? ?Family History  ?Problem Relation Age of Onset  ? CAD Mother 82  ? CAD Father 82  ?     Died age 70  ? CAD Brother 70  ? ? ? ? ?Controlled substance contract: 03/07/21 ?- drug screen to be done today ? ? ? ?Review of Systems  ?Constitutional:  Negative for diaphoresis.  ?Eyes:  Negative for pain.  ?Respiratory:  Negative for shortness of breath.   ?Cardiovascular:  Negative for chest pain, palpitations and leg swelling.  ?Gastrointestinal:  Negative for abdominal pain.  ?Endocrine: Negative for polydipsia.  ?Skin:  Negative for rash.  ?Neurological:  Negative for dizziness, weakness and headaches.  ?Hematological:  Does not bruise/bleed easily.  ?All other systems reviewed and are negative. ? ?   ?Objective:  ? Physical Exam ?Vitals and nursing note reviewed.  ?Constitutional:   ?   General: She is not in acute distress. ?   Appearance: Normal appearance. She is well-developed.  ?HENT:  ?   Head: Normocephalic.  ?   Right Ear: Tympanic membrane normal.  ?   Left Ear: Tympanic membrane normal.  ?   Nose: Nose normal.  ?   Mouth/Throat:  ?   Mouth: Mucous membranes are moist.  ?Eyes:  ?   Pupils:  Pupils are equal, round, and reactive to light.  ?Neck:  ?   Vascular: No carotid bruit or JVD.  ?Cardiovascular:  ?   Rate and Rhythm: Normal rate and regular rhythm.  ?   Heart sounds: Normal heart sounds.  ?Pu

## 2021-06-29 NOTE — Patient Instructions (Signed)
Semaglutide Injection ?What is this medication? ?SEMAGLUTIDE (SEM a GLOO tide) treats type 2 diabetes. It works by increasing insulin levels in your body, which decreases your blood sugar (glucose). It also reduces the amount of sugar released into the blood and slows down your digestion. It can also be used to lower the risk of heart attack and stroke in people with type 2 diabetes. Changes to diet and exercise are often combined with this medication. ?This medicine may be used for other purposes; ask your health care provider or pharmacist if you have questions. ?COMMON BRAND NAME(S): OZEMPIC ?What should I tell my care team before I take this medication? ?They need to know if you have any of these conditions: ?Endocrine tumors (MEN 2) or if someone in your family had these tumors ?Eye disease, vision problems ?History of pancreatitis ?Kidney disease ?Stomach problems ?Thyroid cancer or if someone in your family had thyroid cancer ?An unusual or allergic reaction to semaglutide, other medications, foods, dyes, or preservatives ?Pregnant or trying to get pregnant ?Breast-feeding ?How should I use this medication? ?This medication is for injection under the skin of your upper leg (thigh), stomach area, or upper arm. It is given once every week (every 7 days). You will be taught how to prepare and give this medication. Use exactly as directed. Take your medication at regular intervals. Do not take it more often than directed. ?If you use this medication with insulin, you should inject this medication and the insulin separately. Do not mix them together. Do not give the injections right next to each other. Change (rotate) injection sites with each injection. ?It is important that you put your used needles and syringes in a special sharps container. Do not put them in a trash can. If you do not have a sharps container, call your pharmacist or care team to get one. ?A special MedGuide will be given to you by the  pharmacist with each prescription and refill. Be sure to read this information carefully each time. ?This medication comes with INSTRUCTIONS FOR USE. Ask your pharmacist for directions on how to use this medication. Read the information carefully. Talk to your pharmacist or care team if you have questions. ?Talk to your care team about the use of this medication in children. Special care may be needed. ?Overdosage: If you think you have taken too much of this medicine contact a poison control center or emergency room at once. ?NOTE: This medicine is only for you. Do not share this medicine with others. ?What if I miss a dose? ?If you miss a dose, take it as soon as you can within 5 days after the missed dose. Then take your next dose at your regular weekly time. If it has been longer than 5 days after the missed dose, do not take the missed dose. Take the next dose at your regular time. Do not take double or extra doses. If you have questions about a missed dose, contact your care team for advice. ?What may interact with this medication? ?Other medications for diabetes ?Many medications may cause changes in blood sugar, these include: ?Alcohol containing beverages ?Antiviral medications for HIV or AIDS ?Aspirin and aspirin-like medications ?Certain medications for blood pressure, heart disease, irregular heart beat ?Chromium ?Diuretics ?Female hormones, such as estrogens or progestins, birth control pills ?Fenofibrate ?Gemfibrozil ?Isoniazid ?Lanreotide ?Female hormones or anabolic steroids ?MAOIs like Carbex, Eldepryl, Marplan, Nardil, and Parnate ?Medications for weight loss ?Medications for allergies, asthma, cold, or cough ?Medications for depression,   anxiety, or psychotic disturbances ?Niacin ?Nicotine ?NSAIDs, medications for pain and inflammation, like ibuprofen or naproxen ?Octreotide ?Pasireotide ?Pentamidine ?Phenytoin ?Probenecid ?Quinolone antibiotics such as ciprofloxacin, levofloxacin, ofloxacin ?Some  herbal dietary supplements ?Steroid medications such as prednisone or cortisone ?Sulfamethoxazole; trimethoprim ?Thyroid hormones ?Some medications can hide the warning symptoms of low blood sugar (hypoglycemia). You may need to monitor your blood sugar more closely if you are taking one of these medications. These include: ?Beta-blockers, often used for high blood pressure or heart problems (examples include atenolol, metoprolol, propranolol) ?Clonidine ?Guanethidine ?Reserpine ?This list may not describe all possible interactions. Give your health care provider a list of all the medicines, herbs, non-prescription drugs, or dietary supplements you use. Also tell them if you smoke, drink alcohol, or use illegal drugs. Some items may interact with your medicine. ?What should I watch for while using this medication? ?Visit your care team for regular checks on your progress. ?Drink plenty of fluids while taking this medication. Check with your care team if you get an attack of severe diarrhea, nausea, and vomiting. The loss of too much body fluid can make it dangerous for you to take this medication. ?A test called the HbA1C (A1C) will be monitored. This is a simple blood test. It measures your blood sugar control over the last 2 to 3 months. You will receive this test every 3 to 6 months. ?Learn how to check your blood sugar. Learn the symptoms of low and high blood sugar and how to manage them. ?Always carry a quick-source of sugar with you in case you have symptoms of low blood sugar. Examples include hard sugar candy or glucose tablets. Make sure others know that you can choke if you eat or drink when you develop serious symptoms of low blood sugar, such as seizures or unconsciousness. They must get medical help at once. ?Tell your care team if you have high blood sugar. You might need to change the dose of your medication. If you are sick or exercising more than usual, you might need to change the dose of your  medication. ?Do not skip meals. Ask your care team if you should avoid alcohol. Many nonprescription cough and cold products contain sugar or alcohol. These can affect blood sugar. ?Pens should never be shared. Even if the needle is changed, sharing may result in passing of viruses like hepatitis or HIV. ?Wear a medical ID bracelet or chain, and carry a card that describes your disease and details of your medication and dosage times. ?Do not become pregnant while taking this medication. Women should inform their care team if they wish to become pregnant or think they might be pregnant. There is a potential for serious side effects to an unborn child. Talk to your care team for more information. ?What side effects may I notice from receiving this medication? ?Side effects that you should report to your care team as soon as possible: ?Allergic reactions--skin rash, itching, hives, swelling of the face, lips, tongue, or throat ?Change in vision ?Dehydration--increased thirst, dry mouth, feeling faint or lightheaded, headache, dark yellow or brown urine ?Gallbladder problems--severe stomach pain, nausea, vomiting, fever ?Heart palpitations--rapid, pounding, or irregular heartbeat ?Kidney injury--decrease in the amount of urine, swelling of the ankles, hands, or feet ?Pancreatitis--severe stomach pain that spreads to your back or gets worse after eating or when touched, fever, nausea, vomiting ?Thyroid cancer--new mass or lump in the neck, pain or trouble swallowing, trouble breathing, hoarseness ?Side effects that usually do not require medical   attention (report to your care team if they continue or are bothersome): ?Diarrhea ?Loss of appetite ?Nausea ?Stomach pain ?Vomiting ?This list may not describe all possible side effects. Call your doctor for medical advice about side effects. You may report side effects to FDA at 1-800-FDA-1088. ?Where should I keep my medication? ?Keep out of the reach of children. ?Store  unopened pens in a refrigerator between 2 and 8 degrees C (36 and 46 degrees F). Do not freeze. Protect from light and heat. After you first use the pen, it can be stored for 56 days at room temperature between 15 and

## 2021-06-30 LAB — CBC WITH DIFFERENTIAL/PLATELET
Basophils Absolute: 0.1 10*3/uL (ref 0.0–0.2)
Basos: 1 %
EOS (ABSOLUTE): 0.8 10*3/uL — ABNORMAL HIGH (ref 0.0–0.4)
Eos: 7 %
Hematocrit: 39 % (ref 34.0–46.6)
Hemoglobin: 12.7 g/dL (ref 11.1–15.9)
Immature Grans (Abs): 0 10*3/uL (ref 0.0–0.1)
Immature Granulocytes: 0 %
Lymphocytes Absolute: 2.1 10*3/uL (ref 0.7–3.1)
Lymphs: 20 %
MCH: 28.3 pg (ref 26.6–33.0)
MCHC: 32.6 g/dL (ref 31.5–35.7)
MCV: 87 fL (ref 79–97)
Monocytes Absolute: 0.7 10*3/uL (ref 0.1–0.9)
Monocytes: 7 %
Neutrophils Absolute: 7.2 10*3/uL — ABNORMAL HIGH (ref 1.4–7.0)
Neutrophils: 65 %
Platelets: 413 10*3/uL (ref 150–450)
RBC: 4.49 x10E6/uL (ref 3.77–5.28)
RDW: 12.9 % (ref 11.7–15.4)
WBC: 11 10*3/uL — ABNORMAL HIGH (ref 3.4–10.8)

## 2021-06-30 LAB — LIPID PANEL
Chol/HDL Ratio: 2.9 ratio (ref 0.0–4.4)
Cholesterol, Total: 138 mg/dL (ref 100–199)
HDL: 47 mg/dL (ref >39)
LDL Chol Calc (NIH): 64 mg/dL (ref 0–99)
Triglycerides: 157 mg/dL — ABNORMAL HIGH (ref 0–149)
VLDL Cholesterol Cal: 27 mg/dL (ref 5–40)

## 2021-06-30 LAB — CMP14+EGFR
ALT: 26 IU/L (ref 0–32)
AST: 23 IU/L (ref 0–40)
Albumin/Globulin Ratio: 1.8 (ref 1.2–2.2)
Albumin: 4 g/dL (ref 3.8–4.9)
Alkaline Phosphatase: 63 IU/L (ref 44–121)
BUN/Creatinine Ratio: 20 (ref 9–23)
BUN: 9 mg/dL (ref 6–24)
Bilirubin Total: <0.2 mg/dL (ref 0.0–1.2)
CO2: 26 mmol/L (ref 20–29)
Calcium: 9.4 mg/dL (ref 8.7–10.2)
Chloride: 100 mmol/L (ref 96–106)
Creatinine, Ser: 0.46 mg/dL — ABNORMAL LOW (ref 0.57–1.00)
Globulin, Total: 2.2 g/dL (ref 1.5–4.5)
Glucose: 146 mg/dL — ABNORMAL HIGH (ref 70–99)
Potassium: 4.6 mmol/L (ref 3.5–5.2)
Sodium: 139 mmol/L (ref 134–144)
Total Protein: 6.2 g/dL (ref 6.0–8.5)
eGFR: 114 mL/min/{1.73_m2} (ref >59)

## 2021-07-10 LAB — OPIATES,MS,WB/SP RFX
6-Acetylmorphine: NEGATIVE
Codeine: NEGATIVE ng/mL
Dihydrocodeine: 11.1 ng/mL
Hydrocodone: 45.9 ng/mL
Hydromorphone: NEGATIVE ng/mL
Morphine: NEGATIVE ng/mL
Opiate Confirmation: POSITIVE

## 2021-07-10 LAB — DRUG SCREEN 10 W/CONF, SERUM
Amphetamines, IA: NEGATIVE ng/mL
Barbiturates, IA: NEGATIVE ug/mL
Benzodiazepines, IA: NEGATIVE ng/mL
Cocaine & Metabolite, IA: NEGATIVE ng/mL
Methadone, IA: NEGATIVE ng/mL
Opiates, IA: POSITIVE ng/mL — AB
Oxycodones, IA: NEGATIVE ng/mL
Phencyclidine, IA: NEGATIVE ng/mL
Propoxyphene, IA: NEGATIVE ng/mL
THC(Marijuana) Metabolite, IA: NEGATIVE ng/mL

## 2021-07-10 LAB — OXYCODONES,MS,WB/SP RFX
Oxycocone: NEGATIVE ng/mL
Oxycodones Confirmation: NEGATIVE
Oxymorphone: NEGATIVE ng/mL

## 2021-07-12 ENCOUNTER — Encounter (HOSPITAL_COMMUNITY): Payer: Self-pay

## 2021-07-12 NOTE — Progress Notes (Signed)
[facility] referral received. Attempted to reach patient but was unable to do so. Detailed VM left asking that the patient return my call. ? ?

## 2021-07-19 ENCOUNTER — Other Ambulatory Visit: Payer: Self-pay | Admitting: Nurse Practitioner

## 2021-07-19 DIAGNOSIS — M545 Low back pain, unspecified: Secondary | ICD-10-CM

## 2021-07-20 ENCOUNTER — Telehealth: Payer: Self-pay | Admitting: Nurse Practitioner

## 2021-07-20 NOTE — Telephone Encounter (Signed)
Please advise 

## 2021-07-20 NOTE — Telephone Encounter (Signed)
NTBS.

## 2021-07-20 NOTE — Telephone Encounter (Signed)
Appt scheduled for tomorrow.  °

## 2021-07-21 ENCOUNTER — Ambulatory Visit: Payer: Medicaid Other | Admitting: Nurse Practitioner

## 2021-07-21 ENCOUNTER — Encounter: Payer: Self-pay | Admitting: Nurse Practitioner

## 2021-07-21 VITALS — BP 120/76 | HR 89 | Temp 97.8°F | Resp 20 | Ht 62.0 in | Wt 200.0 lb

## 2021-07-21 DIAGNOSIS — M545 Low back pain, unspecified: Secondary | ICD-10-CM | POA: Diagnosis not present

## 2021-07-21 DIAGNOSIS — J069 Acute upper respiratory infection, unspecified: Secondary | ICD-10-CM

## 2021-07-21 DIAGNOSIS — G8929 Other chronic pain: Secondary | ICD-10-CM | POA: Diagnosis not present

## 2021-07-21 MED ORDER — BENZONATATE 100 MG PO CAPS: 100.0000 mg | ORAL_CAPSULE | Freq: Three times a day (TID) | ORAL | 0 refills | Status: DC | PRN

## 2021-07-21 MED ORDER — PREDNISONE 10 MG (21) PO TBPK: ORAL_TABLET | ORAL | 0 refills | Status: DC

## 2021-07-21 NOTE — Progress Notes (Signed)
   Subjective:    Patient ID: Madison Gentry, female    DOB: Dec 03, 1965, 56 y.o.   MRN: [id number]   Chief Complaint: Leg Pain (Wants refill on prednisone/)   Leg Pain   Patient comes in today c/o bil leg pain. She was seen on 06/29/21 with sciatica and she says that has returned. She too a 6 day prednisone dose pack and that helped some. Rates pain 10/10 today. Walking increases pain and nothing really helps it. She has been using her cane to walk because of the pain. She has chronic back pain and she see pain management.     Review of Systems  Musculoskeletal:  Back pain: chronic.      Objective:   Physical Exam Vitals reviewed.  Constitutional:      Appearance: Normal appearance. She is obese.  Cardiovascular:     Rate and Rhythm: Normal rate and regular rhythm.  Pulmonary:     Breath sounds: Normal breath sounds.  Musculoskeletal:     Comments: Pain of lumbar spine on any movement (+) SLR at 45 degrees on left   Skin:    General: Skin is warm.  Neurological:     General: No focal deficit present.     Mental Status: She is alert and oriented to person, place, and time.  Psychiatric:        Mood and Affect: Mood normal.        Behavior: Behavior normal.   BP 120/76   Pulse 89   Temp 97.8 F (36.6 C) (Temporal)   Resp 20   Ht 5\' 2"  (1.575 m)   Wt 200 lb (90.7 kg)   SpO2 92%   BMI 36.58 kg/m         Assessment & Plan:  Madison Gentry in today with chief complaint of Leg Pain (Wants refill on prednisone/)   1. Chronic midline low back pain without sciatica Moist heat Rest Discuss pain with pain management and have them order MRI - predniSONE (STERAPRED UNI-PAK 21 TAB) 10 MG (21) TBPK tablet; As directed x 6 days  Dispense: 21 tablet; Refill: 0    The above assessment and management plan was discussed with the patient. The patient verbalized understanding of and has agreed to the management plan. Patient is aware to call the clinic if symptoms persist or worsen.  Patient is aware when to return to the clinic for a follow-up visit. Patient educated on when it is appropriate to go to the emergency department.   Mary-Margaret [name], FNP

## 2021-07-21 NOTE — Patient Instructions (Signed)

## 2021-07-21 NOTE — Addendum Note (Signed)
Addended by: Bennie Pierini on: 07/21/2021 11:59 AM   Modules accepted: Orders

## 2021-08-21 ENCOUNTER — Ambulatory Visit (INDEPENDENT_AMBULATORY_CARE_PROVIDER_SITE_OTHER): Payer: Medicaid Other | Admitting: Family Medicine

## 2021-08-21 ENCOUNTER — Encounter: Payer: Self-pay | Admitting: Family Medicine

## 2021-08-21 DIAGNOSIS — G8929 Other chronic pain: Secondary | ICD-10-CM | POA: Diagnosis not present

## 2021-08-21 DIAGNOSIS — M545 Low back pain, unspecified: Secondary | ICD-10-CM

## 2021-08-21 DIAGNOSIS — J411 Mucopurulent chronic bronchitis: Secondary | ICD-10-CM

## 2021-08-21 MED ORDER — BENZONATATE 100 MG PO CAPS: 100.0000 mg | ORAL_CAPSULE | Freq: Three times a day (TID) | ORAL | 0 refills | Status: DC | PRN

## 2021-08-21 MED ORDER — PREDNISONE 10 MG (21) PO TBPK: ORAL_TABLET | ORAL | 0 refills | Status: DC

## 2021-08-21 NOTE — Progress Notes (Signed)
   Virtual Visit  Note Due to COVID-19 pandemic this visit was conducted virtually. This visit type was conducted due to national recommendations for restrictions regarding the COVID-19 Pandemic (e.g. social distancing, sheltering in place) in an effort to limit this patient's exposure and mitigate transmission in our community. All issues noted in this document were discussed and addressed.  A physical exam was not performed with this format.  I connected with Madison Gentry on 08/21/21 at 1250 by telephone and verified that I am speaking with the correct person using two identifiers. Madison Gentry is currently located at home and no one is currently with her during the visit. The provider, Gabriel Earing, FNP is located in their office at time of visit.  I discussed the limitations, risks, security and privacy concerns of performing an evaluation and management service by telephone and the availability of in person appointments. I also discussed with the patient that there may be a patient responsible charge related to this service. The patient expressed understanding and agreed to proceed.  CC: cough, back pain  History and Present Illness:  HPI Madison Gentry reports increased cough with wheezing for the last few days. Symptoms are worse at night. Cough is nonproductive. Also reports mild sore throat. She has been using her maintenance inhaler. Denies fever, shortness of breath, or chest pain. She has not had to use her albuterol inhaler.   She also report chronic back pain with sciatica. Denies changes in bowel or bladder control. She takes hydrocodone chronically but reports breakthrough pain. Prednisone is usually helpful for her breakthrough symptoms.    ROS As per HPI.   Observations/Objective: Alert and oriented x 3. Able to speak in full sentences without difficulty.   Assessment and Plan: Diagnoses and all orders for this visit:  Mucopurulent chronic bronchitis (HCC) Prednisone burst and  tessalon perles as below. Continue prescribed inhaler.  -     predniSONE (STERAPRED UNI-PAK 21 TAB) 10 MG (21) TBPK tablet; As directed x 6 days -     benzonatate (TESSALON PERLES) 100 MG capsule; Take 1 capsule (100 mg total) by mouth 3 (three) times daily as needed for cough.  Chronic midline low back pain without sciatica Continue prescribed pain medications. Prednisone burst as below.  -     predniSONE (STERAPRED UNI-PAK 21 TAB) 10 MG (21) TBPK tablet; As directed x 6 days     Follow Up Instructions: Return to office for new or worsening symptoms, or if symptoms persist.      I discussed the assessment and treatment plan with the patient. The patient was provided an opportunity to ask questions and all were answered. The patient agreed with the plan and demonstrated an understanding of the instructions.   The patient was advised to call back or seek an in-person evaluation if the symptoms worsen or if the condition fails to improve as anticipated.  The above assessment and management plan was discussed with the patient. The patient verbalized understanding of and has agreed to the management plan. Patient is aware to call the clinic if symptoms persist or worsen. Patient is aware when to return to the clinic for a follow-up visit. Patient educated on when it is appropriate to go to the emergency department.   Time call ended:  1302  I provided 12 minutes of  non face-to-face time during this encounter.    Gabriel Earing, FNP

## 2021-08-30 ENCOUNTER — Telehealth: Payer: Self-pay | Admitting: Nurse Practitioner

## 2021-08-30 NOTE — Telephone Encounter (Signed)
She states that she notices a difference when she is on the steroids. She said that the pain in her legs is unbearable. She would like to go on Steroids all the time. I advised patient that MMM was out of the office. She states that she wanted to send to another doctor as she wanted to get something today. Patient states she is in a lot of pain

## 2021-08-30 NOTE — Telephone Encounter (Signed)
It is not good to be on steroids long term. She will want to discuss management of her chronic pain with her PCP.

## 2021-08-31 NOTE — Telephone Encounter (Signed)
Patient should not need any more steroids. She cannot stay on these

## 2021-08-31 NOTE — Telephone Encounter (Signed)
Pt returned missed call. Made pt aware of providers response to her request.  Pt says its the only thing that helps with her pain since every pharmacy has Hydrocodone on back order. Wants to know what she is supposed to do?

## 2021-08-31 NOTE — Telephone Encounter (Signed)
No answer, mailbox full

## 2021-08-31 NOTE — Telephone Encounter (Signed)
Pt wants to know if steroid will be called in to pharmacy

## 2021-09-01 NOTE — Telephone Encounter (Signed)
No answer, mailbox full

## 2021-09-25 ENCOUNTER — Other Ambulatory Visit: Payer: Self-pay | Admitting: Nurse Practitioner

## 2021-09-25 DIAGNOSIS — E1142 Type 2 diabetes mellitus with diabetic polyneuropathy: Secondary | ICD-10-CM

## 2021-10-02 ENCOUNTER — Ambulatory Visit: Payer: Medicaid Other | Admitting: Nurse Practitioner

## 2021-10-05 ENCOUNTER — Ambulatory Visit: Payer: Medicaid Other | Admitting: Nurse Practitioner

## 2021-10-05 ENCOUNTER — Encounter: Payer: Self-pay | Admitting: Nurse Practitioner

## 2021-10-05 VITALS — BP 111/70 | HR 90 | Temp 97.8°F | Resp 20 | Ht 62.0 in | Wt 189.0 lb

## 2021-10-05 DIAGNOSIS — E7800 Pure hypercholesterolemia, unspecified: Secondary | ICD-10-CM

## 2021-10-05 DIAGNOSIS — G8929 Other chronic pain: Secondary | ICD-10-CM

## 2021-10-05 DIAGNOSIS — J411 Mucopurulent chronic bronchitis: Secondary | ICD-10-CM

## 2021-10-05 DIAGNOSIS — F5101 Primary insomnia: Secondary | ICD-10-CM

## 2021-10-05 DIAGNOSIS — K219 Gastro-esophageal reflux disease without esophagitis: Secondary | ICD-10-CM

## 2021-10-05 DIAGNOSIS — E1142 Type 2 diabetes mellitus with diabetic polyneuropathy: Secondary | ICD-10-CM

## 2021-10-05 DIAGNOSIS — I25119 Atherosclerotic heart disease of native coronary artery with unspecified angina pectoris: Secondary | ICD-10-CM

## 2021-10-05 DIAGNOSIS — I10 Essential (primary) hypertension: Secondary | ICD-10-CM | POA: Diagnosis not present

## 2021-10-05 DIAGNOSIS — F411 Generalized anxiety disorder: Secondary | ICD-10-CM

## 2021-10-05 DIAGNOSIS — F3341 Major depressive disorder, recurrent, in partial remission: Secondary | ICD-10-CM

## 2021-10-05 DIAGNOSIS — M545 Low back pain, unspecified: Secondary | ICD-10-CM

## 2021-10-05 DIAGNOSIS — N393 Stress incontinence (female) (male): Secondary | ICD-10-CM

## 2021-10-05 LAB — BAYER DCA HB A1C WAIVED: HB A1C (BAYER DCA - WAIVED): 6.6 % — ABNORMAL HIGH (ref 4.8–5.6)

## 2021-10-05 MED ORDER — BUSPIRONE HCL 7.5 MG PO TABS: 7.5000 mg | ORAL_TABLET | Freq: Every day | ORAL | 2 refills | Status: DC

## 2021-10-05 MED ORDER — SEMAGLUTIDE (1 MG/DOSE) 4 MG/3ML ~~LOC~~ SOPN: 1.0000 mg | PEN_INJECTOR | SUBCUTANEOUS | 3 refills | Status: DC

## 2021-10-05 NOTE — Progress Notes (Addendum)
Subjective:    Patient ID: Madison Gentry, female    DOB: 13-Jun-1965, 55 y.o.   MRN: [id number]   Chief Complaint: medical management of chronic issues     HPI:  Madison Gentry is a 56 y.o. who identifies as a female who was assigned female at birth.   Social history: Lives with: husband and children and grandchildren Work history: disability   Comes in today for follow up of the following chronic medical issues:  1. Primary hypertension No c/o chest pain or headaches. She does have Occasional SOB due to her COPD. BP Readings from Last 3 Encounters:  07/21/21 120/76  06/29/21 108/69  06/01/21 125/80     2. Atherosclerosis of native coronary artery of native heart with angina pectoris ([place]) Seen on chest xray- no symptoms that aware of.   3. Mucopurulent chronic bronchitis ([place]) Uses her symbicort daily and does well. Has albuterol when needed. Still smokes over a pack a day.  4. Gastroesophageal reflux disease without esophagitis Is on omeprazole daily and is doing well.  5. Type 2 diabetes mellitus with diabetic polyneuropathy, without long-term current use of insulin ([place]) She does not check her blood sugars at home. Lab Results  Component Value Date   HGBA1C 7.2 (H) 06/29/2021     6. Pure hypercholesterolemia Does not watch diet and does no dedicated exercise. Lab Results  Component Value Date   HGBA1C 7.2 (H) 06/29/2021   The 10-year ASCVD risk score (Arnett DK, et al., 2019) is: 6.8%   7. Recurrent major depressive disorder, in partial remission ([place]) Is currently on no antidepressant. Is doing well.    10/05/2021   11:56 AM 07/21/2021   11:46 AM 06/29/2021    8:58 AM  Depression screen PHQ 2/9  Decreased Interest 2 3 3   Down, Depressed, Hopeless 0 2 2  PHQ - 2 Score 2 5 5   Altered sleeping 2 2 3   Tired, decreased energy 2 3 3   Change in appetite 1 0 2  Feeling bad or failure about yourself  0 0 0  Trouble concentrating 0 0 0  Moving slowly or  fidgety/restless 0 1 0  Suicidal thoughts 0 0 0  PHQ-9 Score 7 11 13   Difficult doing work/chores Very difficult Extremely dIfficult Not difficult at all     8. GAD (generalized anxiety disorder) Is on buspar daily. Helps with her anxiety    10/05/2021   11:56 AM 07/21/2021   11:46 AM 06/29/2021    8:58 AM 06/01/2021    8:38 AM  GAD 7 : Generalized Anxiety Score  Nervous, Anxious, on Edge 2 2 0 2  Control/stop worrying 0 0 0 1  Worry too much - different things 0 0 2 1  Trouble relaxing 2 1 3 3   Restless 0 0 0 2  Easily annoyed or irritable 2 0 3 3  Afraid - awful might happen 0 0 0 0  Total GAD 7 Score 6 3 8 12   Anxiety Difficulty Somewhat difficult Extremely difficult  Extremely difficult      9. Primary insomnia Is on ambien to sleep and is not able to sleep without taking.  10. Stress incontinence of urine Has lot sof stressi ncontinenece. Use depends daily  11. Chronic midline low back pain without sciatica Sees pain management every month  12. Morbid obesity ([place]) Weight is down 11 lbs Wt Readings from Last 3 Encounters:  10/05/21 189 lb (85.7 kg)  07/21/21 200 lb (90.7 kg)  06/29/21  207 lb (93.9 kg)   BMI Readings from Last 3 Encounters:  10/05/21 34.57 kg/m  07/21/21 36.58 kg/m  06/29/21 37.86 kg/m     New complaints: None today  No Known Allergies Outpatient Encounter Medications as of 10/05/2021  Medication Sig   ACCU-CHEK GUIDE test strip USE 1 UP TO 4 TIMES DAILY AS DIRECTED   Accu-Chek Softclix Lancets lancets Use as directed up to 4 times daily as directed Dx E11.9   albuterol (VENTOLIN HFA) 108 (90 Base) MCG/ACT inhaler Inhale 2 puffs into the lungs every 6 (six) hours as needed for wheezing or shortness of breath.   amLODipine (NORVASC) 10 MG tablet Take 1 tablet (10 mg total) by mouth daily.   Aspirin-Acetaminophen-Caffeine 520-260-32.5 MG PACK Take 1 packet by mouth 2 (two) times daily as needed. For pain or headache   baclofen (LIORESAL)  10 MG tablet Take 10 mg by mouth 3 (three) times daily.   benzonatate (TESSALON PERLES) 100 MG capsule Take 1 capsule (100 mg total) by mouth 3 (three) times daily as needed for cough.   Blood Glucose Monitoring Suppl (ACCU-CHEK GUIDE ME) w/Device KIT USE AS DIRECTED UP TO 4 TIMES DAILY AS DIRECTED Dx E11.9   busPIRone (BUSPAR) 7.5 MG tablet Take 1 tablet (7.5 mg total) by mouth daily.   cyclobenzaprine (FLEXERIL) 10 MG tablet Take 1 tablet (10 mg total) by mouth 3 (three) times daily.   diclofenac Sodium (VOLTAREN) 1 % GEL SMARTSIG:1 Application Topical 4 Times Daily PRN   ezetimibe (ZETIA) 10 MG tablet Take 1 tablet (10 mg total) by mouth daily. TAKE 1 TABLET BY MOUTH ONCE DAILY   FLUoxetine (PROZAC) 40 MG capsule Take 1 capsule (40 mg total) by mouth 2 (two) times daily.   guaiFENesin (MUCINEX) 600 MG 12 hr tablet Take 1 tablet (600 mg total) by mouth 2 (two) times daily.   hydrochlorothiazide (HYDRODIURIL) 25 MG tablet Take 1 tablet (25 mg total) by mouth daily.   ibuprofen (ADVIL,MOTRIN) 200 MG tablet Take 200 mg by mouth every 6 (six) hours as needed. Takes 800 mg when needed for menstrual cramping   Incontinence Supplies KIT Patient needs Pull ups, chuck pads and wipes.   lisinopril (ZESTRIL) 10 MG tablet Take 1 tablet (10 mg total) by mouth daily.   metFORMIN (GLUCOPHAGE) 500 MG tablet Take 1 tablet (500 mg total) by mouth 2 (two) times daily with a meal.   nicotine (NICODERM CQ - DOSED IN MG/24 HOURS) 21 mg/24hr patch APPLY ONE PATCH TOPICALLY ONCE DAILY   nitroGLYCERIN (NITROSTAT) 0.4 MG SL tablet DISSOLVE ONE TABLET UNDER THE TONGUE EVERY 5 MINUTES AS NEEDED FOR CHEST PAIN.  DO NOT EXCEED A TOTAL OF 3 DOSES IN 15 MINUTES (Patient taking differently: DISSOLVE ONE TABLET UNDER THE TONGUE EVERY 5 MINUTES AS NEEDED FOR CHEST PAIN.  DO NOT EXCEED A TOTAL OF 3 DOSES IN 15 MINUTES)   omeprazole (PRILOSEC) 40 MG capsule Take 1 capsule (40 mg total) by mouth daily.   OZEMPIC, 0.25 OR 0.5 MG/DOSE,  2 MG/3ML SOPN INJECT 0.$RemoveBefor'5MG'JWbHvkxtolcv$  SUBCUTANEOUSLY ONCE A WEEK   predniSONE (STERAPRED UNI-PAK 21 TAB) 10 MG (21) TBPK tablet As directed x 6 days   silver sulfADIAZINE (SILVADENE) 1 % cream Apply 1 application. topically 2 (two) times daily.   simvastatin (ZOCOR) 40 MG tablet Take 1 tablet (40 mg total) by mouth daily at 6 PM.   SYMBICORT 80-4.5 MCG/ACT inhaler Inhale 2 puffs into the lungs 2 (two) times daily.   traMADol (ULTRAM-ER)  100 MG 24 hr tablet Take 100 mg by mouth daily.   zolpidem (AMBIEN) 10 MG tablet Take 1 tablet (10 mg total) by mouth at bedtime.   No facility-administered encounter medications on file as of 10/05/2021.    Past Surgical History:  Procedure Laterality Date   TUBAL LIGATION      Family History  Problem Relation Age of Onset   CAD Mother 59   CAD Father 79       Died age 72   CAD Brother 26      Controlled substance contract: n/a     Review of Systems  Constitutional:  Negative for diaphoresis.  Eyes:  Negative for pain.  Respiratory:  Negative for shortness of breath.   Cardiovascular:  Negative for chest pain, palpitations and leg swelling.  Gastrointestinal:  Negative for abdominal pain.  Endocrine: Negative for polydipsia.  Skin:  Negative for rash.  Neurological:  Negative for dizziness, weakness and headaches.  Hematological:  Does not bruise/bleed easily.  All other systems reviewed and are negative.      Objective:   Physical Exam Vitals and nursing note reviewed.  Constitutional:      General: She is not in acute distress.    Appearance: Normal appearance. She is well-developed.  HENT:     Head: Normocephalic.     Right Ear: Tympanic membrane normal.     Left Ear: Tympanic membrane normal.     Nose: Nose normal.     Mouth/Throat:     Mouth: Mucous membranes are moist.  Eyes:     Pupils: Pupils are equal, round, and reactive to light.  Neck:     Vascular: No carotid bruit or JVD.  Cardiovascular:     Rate and Rhythm: Normal  rate and regular rhythm.     Heart sounds: Normal heart sounds.  Pulmonary:     Effort: Pulmonary effort is normal. No respiratory distress.     Breath sounds: Normal breath sounds. No wheezing or rales.  Chest:     Chest wall: No tenderness.  Abdominal:     General: Bowel sounds are normal. There is no distension or abdominal bruit.     Palpations: Abdomen is soft. There is no hepatomegaly, splenomegaly, mass or pulsatile mass.     Tenderness: There is no abdominal tenderness.  Musculoskeletal:        General: Normal range of motion.     Cervical back: Normal range of motion and neck supple.  Lymphadenopathy:     Cervical: No cervical adenopathy.  Skin:    General: Skin is warm and dry.  Neurological:     Mental Status: She is alert and oriented to person, place, and time.     Deep Tendon Reflexes: Reflexes are normal and symmetric.  Psychiatric:        Behavior: Behavior normal.        Thought Content: Thought content normal.        Judgment: Judgment normal.     BP 111/70   Pulse 90   Temp 97.8 F (36.6 C) (Temporal)   Resp 20   Ht $R'5\' 2"'Qz$  (1.575 m)   Wt 189 lb (85.7 kg)   SpO2 94%   BMI 34.57 kg/m    HGBA1c 6.6%    Assessment & Plan:   Madison Gentry comes in today with chief complaint of Medical Management of Chronic Issues   Diagnosis and orders addressed:  1. Primary hypertension [place] sodium diet - CBC with Differential/Platelet -  CMP14+EGFR  2. Atherosclerosis of native coronary artery of native heart with angina pectoris ([place])  3. Mucopurulent chronic bronchitis (HCC) Smoking cessation encouraged  4. Gastroesophageal reflux disease without esophagitis Avoid spicy foods Do not eat 2 hours prior to bedtime  5. Type 2 diabetes mellitus with diabetic polyneuropathy, without long-term current use of insulin (HCC) Continue to watch  carbs in diet - Bayer DCA Hb A1c Waived  6. Pure hypercholesterolemia Low fat diet - Lipid panel  7. Recurrent major  depressive disorder, in partial remission ([place]) Stress management  8. GAD (generalized anxiety disorder) - busPIRone (BUSPAR) 7.5 MG tablet; Take 1 tablet (7.5 mg total) by mouth daily.  Dispense: 90 tablet; Refill: 2  9. Primary insomnia Bedtime routine  10. Stress incontinence of urine  11. Chronic midline low back pain without sciatica Moist heat rest  12. Morbid obesity ([place]) Discussed diet and exercise for person with BMI >25 Will recheck weight in 3-6 months    Labs pending Health Maintenance reviewed Diet and exercise encouraged  Follow up plan: 3 months   Mary-Margaret Hassell Done, FNP

## 2021-10-05 NOTE — Patient Instructions (Signed)

## 2021-10-06 LAB — CMP14+EGFR
ALT: 22 IU/L (ref 0–32)
AST: 13 IU/L (ref 0–40)
Albumin/Globulin Ratio: 1.6 (ref 1.2–2.2)
Albumin: 3.9 g/dL (ref 3.8–4.9)
Alkaline Phosphatase: 59 IU/L (ref 44–121)
BUN/Creatinine Ratio: 20 (ref 9–23)
BUN: 9 mg/dL (ref 6–24)
Bilirubin Total: 0.2 mg/dL (ref 0.0–1.2)
CO2: 23 mmol/L (ref 20–29)
Calcium: 9.3 mg/dL (ref 8.7–10.2)
Chloride: 98 mmol/L (ref 96–106)
Creatinine, Ser: 0.44 mg/dL — ABNORMAL LOW (ref 0.57–1.00)
Globulin, Total: 2.5 g/dL (ref 1.5–4.5)
Glucose: 140 mg/dL — ABNORMAL HIGH (ref 70–99)
Potassium: 4.2 mmol/L (ref 3.5–5.2)
Sodium: 137 mmol/L (ref 134–144)
Total Protein: 6.4 g/dL (ref 6.0–8.5)
eGFR: 115 mL/min/{1.73_m2} (ref >59)

## 2021-10-06 LAB — CBC WITH DIFFERENTIAL/PLATELET
Basophils Absolute: 0.1 10*3/uL (ref 0.0–0.2)
Basos: 1 %
EOS (ABSOLUTE): 0.5 10*3/uL — ABNORMAL HIGH (ref 0.0–0.4)
Eos: 4 %
Hematocrit: 41.4 % (ref 34.0–46.6)
Hemoglobin: 13.5 g/dL (ref 11.1–15.9)
Immature Grans (Abs): 0 10*3/uL (ref 0.0–0.1)
Immature Granulocytes: 0 %
Lymphocytes Absolute: 2.7 10*3/uL (ref 0.7–3.1)
Lymphs: 21 %
MCH: 28.6 pg (ref 26.6–33.0)
MCHC: 32.6 g/dL (ref 31.5–35.7)
MCV: 88 fL (ref 79–97)
Monocytes Absolute: 0.8 10*3/uL (ref 0.1–0.9)
Monocytes: 6 %
Neutrophils Absolute: 8.8 10*3/uL — ABNORMAL HIGH (ref 1.4–7.0)
Neutrophils: 68 %
Platelets: 364 10*3/uL (ref 150–450)
RBC: 4.72 x10E6/uL (ref 3.77–5.28)
RDW: 14.3 % (ref 11.7–15.4)
WBC: 13 10*3/uL — ABNORMAL HIGH (ref 3.4–10.8)

## 2021-10-06 LAB — LIPID PANEL
Chol/HDL Ratio: 3 ratio (ref 0.0–4.4)
Cholesterol, Total: 139 mg/dL (ref 100–199)
HDL: 47 mg/dL (ref >39)
LDL Chol Calc (NIH): 66 mg/dL (ref 0–99)
Triglycerides: 151 mg/dL — ABNORMAL HIGH (ref 0–149)
VLDL Cholesterol Cal: 26 mg/dL (ref 5–40)

## 2021-10-26 ENCOUNTER — Other Ambulatory Visit: Payer: Self-pay | Admitting: Nurse Practitioner

## 2021-10-26 DIAGNOSIS — E7800 Pure hypercholesterolemia, unspecified: Secondary | ICD-10-CM

## 2021-11-13 ENCOUNTER — Other Ambulatory Visit: Payer: Self-pay | Admitting: Nurse Practitioner

## 2021-11-13 DIAGNOSIS — F5101 Primary insomnia: Secondary | ICD-10-CM

## 2021-11-16 ENCOUNTER — Telehealth: Payer: Self-pay | Admitting: Nurse Practitioner

## 2021-11-16 ENCOUNTER — Ambulatory Visit: Payer: Medicaid Other | Admitting: Nurse Practitioner

## 2021-11-16 ENCOUNTER — Other Ambulatory Visit: Payer: Self-pay | Admitting: Nurse Practitioner

## 2021-11-16 ENCOUNTER — Encounter: Payer: Self-pay | Admitting: Nurse Practitioner

## 2021-11-16 VITALS — BP 111/67 | HR 86 | Temp 97.7°F | Resp 20 | Ht 62.0 in | Wt 195.0 lb

## 2021-11-16 DIAGNOSIS — J209 Acute bronchitis, unspecified: Secondary | ICD-10-CM | POA: Diagnosis not present

## 2021-11-16 DIAGNOSIS — E1142 Type 2 diabetes mellitus with diabetic polyneuropathy: Secondary | ICD-10-CM

## 2021-11-16 DIAGNOSIS — J440 Chronic obstructive pulmonary disease with acute lower respiratory infection: Secondary | ICD-10-CM | POA: Diagnosis not present

## 2021-11-16 DIAGNOSIS — F5101 Primary insomnia: Secondary | ICD-10-CM

## 2021-11-16 MED ORDER — SEMAGLUTIDE (2 MG/DOSE) 8 MG/3ML ~~LOC~~ SOPN: 2.0000 mg | PEN_INJECTOR | SUBCUTANEOUS | 3 refills | Status: DC

## 2021-11-16 MED ORDER — AZITHROMYCIN 250 MG PO TABS: ORAL_TABLET | ORAL | 0 refills | Status: DC

## 2021-11-16 MED ORDER — ZOLPIDEM TARTRATE 10 MG PO TABS: 10.0000 mg | ORAL_TABLET | Freq: Every day | ORAL | 2 refills | Status: DC

## 2021-11-16 MED ORDER — BENZONATATE 100 MG PO CAPS: 100.0000 mg | ORAL_CAPSULE | Freq: Two times a day (BID) | ORAL | 0 refills | Status: DC | PRN

## 2021-11-16 NOTE — Progress Notes (Signed)
Subjective:    Patient ID: Madison Gentry, female    DOB: 1965/09/23, 56 y.o.   MRN: [id number]   Chief Complaint: Cough, Wheezing, and Wants to increase ozempic   Cough This is a new problem. The current episode started in the past 7 days. The problem has been waxing and waning. The problem occurs constantly. The cough is Productive of sputum. Associated symptoms include rhinorrhea and wheezing (worse at night). Pertinent negatives include no chills, ear congestion, fever or shortness of breath. Nothing aggravates the symptoms. She has tried OTC cough suppressant and OTC inhaler for the symptoms. The treatment provided mild relief.    Has been on ozempic for several months. Current dose os not working as well. Would like to increase dose.   Review of Systems  Constitutional:  Positive for fatigue. Negative for chills and fever.  HENT:  Positive for rhinorrhea.   Respiratory:  Positive for cough and wheezing (worse at night). Negative for shortness of breath.        Objective:   Physical Exam Vitals reviewed.  Constitutional:      Appearance: Normal appearance. She is obese.  HENT:     Right Ear: Tympanic membrane normal.     Left Ear: Tympanic membrane normal.     Nose: Congestion and rhinorrhea present.     Mouth/Throat:     Pharynx: No oropharyngeal exudate or posterior oropharyngeal erythema.  Eyes:     Pupils: Pupils are equal, round, and reactive to light.  Cardiovascular:     Rate and Rhythm: Normal rate and regular rhythm.     Heart sounds: Normal heart sounds.  Pulmonary:     Breath sounds: Normal breath sounds. No wheezing.     Comments: deep tight cough Musculoskeletal:     Cervical back: Normal range of motion and neck supple.  Skin:    General: Skin is warm.  Neurological:     General: No focal deficit present.     Mental Status: She is alert and oriented to person, place, and time.  Psychiatric:        Mood and Affect: Mood normal.        Behavior:  Behavior normal.    BP 111/67   Pulse 86   Temp 97.7 F (36.5 C) (Temporal)   Resp 20   Ht 5\' 2"  (1.575 m)   Wt 195 lb (88.5 kg)   SpO2 97%   BMI 35.67 kg/m          Assessment & Plan:   Madison Gentry in today with chief complaint of Cough, Wheezing, and Wants to increase ozempic   1. Type 2 diabetes mellitus with diabetic polyneuropathy, without long-term current use of insulin (HCC) Medication dose change - Semaglutide, 2 MG/DOSE, 8 MG/3ML SOPN; Inject 2 mg as directed once a week.  Dispense: 3 mL; Refill: 3  2. Acute bronchitis with COPD ([place]) 1. Take meds as prescribed 2. Use a cool mist humidifier especially during the winter months and when heat has been humid. 3. Use saline nose sprays frequently 4. Saline irrigations of the nose can be very helpful if done frequently.  * 4X daily for 1 week*  * Use of a nettie pot can be helpful with this. Follow directions with this* 5. Drink plenty of fluids 6. Keep thermostat turn down low 7.For any cough or congestion- tessalon perles 8. For fever or aces or pains- take tylenol or ibuprofen appropriate for age and weight.  * for fevers greater  than 101 orally you may alternate ibuprofen and tylenol every  3 hours.   Smoking cessation encouraged - azithromycin (ZITHROMAX Z-PAK) 250 MG tablet; As directed  Dispense: 6 tablet; Refill: 0 - benzonatate (TESSALON) 100 MG capsule; Take 1 capsule (100 mg total) by mouth 2 (two) times daily as needed for cough.  Dispense: 20 capsule; Refill: 0    The above assessment and management plan was discussed with the patient. The patient verbalized understanding of and has agreed to the management plan. Patient is aware to call the clinic if symptoms persist or worsen. Patient is aware when to return to the clinic for a follow-up visit. Patient educated on when it is appropriate to go to the emergency department.   Mary-Margaret Daphine Deutscher, FNP

## 2021-11-16 NOTE — Telephone Encounter (Signed)
She does not need prednisone right now

## 2021-11-16 NOTE — Progress Notes (Signed)
Ambien refilled

## 2021-11-16 NOTE — Patient Instructions (Signed)

## 2021-11-16 NOTE — Telephone Encounter (Signed)
Spoke with patient through another call. Advised that provider doesn't think she needs prednisone at this time and sent in [place] for bronchitis

## 2021-11-17 ENCOUNTER — Other Ambulatory Visit: Payer: Self-pay

## 2021-11-17 ENCOUNTER — Emergency Department (HOSPITAL_COMMUNITY)
Admission: EM | Admit: 2021-11-17 | Discharge: 2021-11-17 | Disposition: A | Discharge status: Home / Self Care | Payer: Medicaid Other | Attending: Emergency Medicine | Admitting: Emergency Medicine

## 2021-11-17 ENCOUNTER — Encounter (HOSPITAL_COMMUNITY): Payer: Self-pay | Admitting: *Deleted

## 2021-11-17 DIAGNOSIS — Z7984 Long term (current) use of oral hypoglycemic drugs: Secondary | ICD-10-CM | POA: Diagnosis not present

## 2021-11-17 DIAGNOSIS — I10 Essential (primary) hypertension: Secondary | ICD-10-CM | POA: Diagnosis not present

## 2021-11-17 DIAGNOSIS — S39012A Strain of muscle, fascia and tendon of lower back, initial encounter: Secondary | ICD-10-CM | POA: Diagnosis not present

## 2021-11-17 DIAGNOSIS — Z7951 Long term (current) use of inhaled steroids: Secondary | ICD-10-CM | POA: Insufficient documentation

## 2021-11-17 DIAGNOSIS — Y9301 Activity, walking, marching and hiking: Secondary | ICD-10-CM | POA: Insufficient documentation

## 2021-11-17 DIAGNOSIS — I2510 Atherosclerotic heart disease of native coronary artery without angina pectoris: Secondary | ICD-10-CM | POA: Insufficient documentation

## 2021-11-17 DIAGNOSIS — Z79899 Other long term (current) drug therapy: Secondary | ICD-10-CM | POA: Diagnosis not present

## 2021-11-17 DIAGNOSIS — T148XXA Other injury of unspecified body region, initial encounter: Secondary | ICD-10-CM

## 2021-11-17 DIAGNOSIS — E119 Type 2 diabetes mellitus without complications: Secondary | ICD-10-CM | POA: Diagnosis not present

## 2021-11-17 DIAGNOSIS — Z7952 Long term (current) use of systemic steroids: Secondary | ICD-10-CM | POA: Diagnosis not present

## 2021-11-17 DIAGNOSIS — J449 Chronic obstructive pulmonary disease, unspecified: Secondary | ICD-10-CM | POA: Diagnosis not present

## 2021-11-17 DIAGNOSIS — Z7982 Long term (current) use of aspirin: Secondary | ICD-10-CM | POA: Diagnosis not present

## 2021-11-17 DIAGNOSIS — M549 Dorsalgia, unspecified: Secondary | ICD-10-CM | POA: Diagnosis present

## 2021-11-17 DIAGNOSIS — X509XXA Other and unspecified overexertion or strenuous movements or postures, initial encounter: Secondary | ICD-10-CM | POA: Insufficient documentation

## 2021-11-17 LAB — CBC WITH DIFFERENTIAL/PLATELET
Abs Immature Granulocytes: 0.03 10*3/uL (ref 0.00–0.07)
Basophils Absolute: 0.1 10*3/uL (ref 0.0–0.1)
Basophils Relative: 1 %
Eosinophils Absolute: 0.5 10*3/uL (ref 0.0–0.5)
Eosinophils Relative: 5 %
HCT: 40.7 % (ref 36.0–46.0)
Hemoglobin: 13.3 g/dL (ref 12.0–15.0)
Immature Granulocytes: 0 %
Lymphocytes Relative: 30 %
Lymphs Abs: 3.3 10*3/uL (ref 0.7–4.0)
MCH: 29.4 pg (ref 26.0–34.0)
MCHC: 32.7 g/dL (ref 30.0–36.0)
MCV: 89.8 fL (ref 80.0–100.0)
Monocytes Absolute: 0.7 10*3/uL (ref 0.1–1.0)
Monocytes Relative: 6 %
Neutro Abs: 6.3 10*3/uL (ref 1.7–7.7)
Neutrophils Relative %: 58 %
Platelets: 407 10*3/uL — ABNORMAL HIGH (ref 150–400)
RBC: 4.53 MIL/uL (ref 3.87–5.11)
RDW: 14.3 % (ref 11.5–15.5)
WBC: 10.9 10*3/uL — ABNORMAL HIGH (ref 4.0–10.5)
nRBC: 0 % (ref 0.0–0.2)

## 2021-11-17 LAB — URINALYSIS, ROUTINE W REFLEX MICROSCOPIC
Bilirubin Urine: NEGATIVE
Glucose, UA: NEGATIVE mg/dL
Hgb urine dipstick: NEGATIVE
Ketones, ur: NEGATIVE mg/dL
Leukocytes,Ua: NEGATIVE
Nitrite: NEGATIVE
Protein, ur: NEGATIVE mg/dL
Specific Gravity, Urine: 1.013 (ref 1.005–1.030)
pH: 6 (ref 5.0–8.0)

## 2021-11-17 LAB — COMPREHENSIVE METABOLIC PANEL
ALT: 23 U/L (ref 0–44)
AST: 16 U/L (ref 15–41)
Albumin: 3.6 g/dL (ref 3.5–5.0)
Alkaline Phosphatase: 47 U/L (ref 38–126)
Anion gap: 9 (ref 5–15)
BUN: 12 mg/dL (ref 6–20)
CO2: 30 mmol/L (ref 22–32)
Calcium: 9 mg/dL (ref 8.9–10.3)
Chloride: 99 mmol/L (ref 98–111)
Creatinine, Ser: 0.45 mg/dL (ref 0.44–1.00)
GFR, Estimated: >60 mL/min (ref >60)
Glucose, Bld: 188 mg/dL — ABNORMAL HIGH (ref 70–99)
Potassium: 4.2 mmol/L (ref 3.5–5.1)
Sodium: 138 mmol/L (ref 135–145)
Total Bilirubin: 0.4 mg/dL (ref 0.3–1.2)
Total Protein: 6.7 g/dL (ref 6.5–8.1)

## 2021-11-17 LAB — D-DIMER, QUANTITATIVE: D-Dimer, Quant: 0.27 ug/mL-FEU (ref 0.00–0.50)

## 2021-11-17 MED ORDER — PREDNISONE 10 MG (21) PO TBPK: ORAL_TABLET | Freq: Every day | ORAL | 0 refills | Status: DC

## 2021-11-17 MED ORDER — NICOTINE 14 MG/24HR TD PT24
14.0000 mg | MEDICATED_PATCH | Freq: Once | TRANSDERMAL | Status: DC
  Administered 2021-11-17: 14 mg via TRANSDERMAL
  Filled 2021-11-17: qty 1

## 2021-11-17 NOTE — Discharge Instructions (Addendum)
You were seen in the emergency department today for back pain.  Your labs are reassuring.  I think that this is likely muscular.  Please continue taking pain medication at home and I have prescribed you prednisone.  It may also be beneficial for you to use heat and massage over the area.  Please return if you are having significantly worsening pain, shortness of breath or chest pain or develop a rash to your back.

## 2021-11-17 NOTE — ED Notes (Signed)
Pt ambulated out of room to ask how much longer is it going to be to be seen.  Explained to pt that a ED provided has signed to see her and will be in as soon as possible.  Pt states her pain medication that she takes four times a day is not helping her pain.

## 2021-11-17 NOTE — ED Notes (Signed)
Pt left the unit, pt states she went to use the bathroom out in waiting room due to someone using the bathroom across the hall. Pt was aware she needed a urine sample but did not get one.  Instructed pt that we still need urine sample.

## 2021-11-17 NOTE — ED Provider Notes (Signed)
[facility] EMERGENCY DEPARTMENT Provider Note   CSN: [id number] Arrival date & time: 11/17/21  1331     History  Chief Complaint  Patient presents with   Back Pain    Madison Gentry is a 56 y.o. female. With past medical history of stroke, HTN, COPD, T2DM, CAD who presents to the emergency department with back pain.  States this morning she had a sudden onset of right back pain this morning. She describes and points to the left flank. States that the pain is present always but significantly worsens with walking or movement or twisting. States that she "feels like her breath is taken" from her when the pain comes on. Pain is better with sitting still. Denies feeling short of breath when she isn't moving. She denies palpitations, or chest pain. Denies dysuria or hematuria. Denies rash. Denies trauma to her back but does state that she has been sleeping in a recliner for the past 3 days because her grandchildren have been visiting and wonders if this contributed to her symptoms.     Back Pain Associated symptoms: no chest pain, no dysuria and no fever        Home Medications Prior to Admission medications   Medication Sig Start Date End Date Taking? Authorizing Provider  predniSONE (STERAPRED UNI-PAK 21 TAB) 10 MG (21) TBPK tablet Take by mouth daily. Take 6 tabs by mouth daily  for 2 days, then 5 tabs for 2 days, then 4 tabs for 2 days, then 3 tabs for 2 days, 2 tabs for 2 days, then 1 tab by mouth daily for 2 days 11/17/21  Yes Erskine Speed, Raychelle Hudman E, PA-C  ACCU-CHEK GUIDE test strip USE 1 UP TO 4 TIMES DAILY AS DIRECTED 03/02/21   [provider]  Accu-Chek Softclix Lancets lancets Use as directed up to 4 times daily as directed Dx E11.9 09/25/21   Hassell Done, Mary-Margaret, FNP  albuterol (VENTOLIN HFA) 108 (90 Base) MCG/ACT inhaler Inhale 2 puffs into the lungs every 6 (six) hours as needed for wheezing or shortness of breath. 06/01/21   Gwenlyn Perking, FNP  amLODipine (NORVASC) 10 MG  tablet Take 1 tablet (10 mg total) by mouth daily. 06/29/21   Hassell Done Mary-Margaret, FNP  Aspirin-Acetaminophen-Caffeine 520-260-32.5 MG PACK Take 1 packet by mouth 2 (two) times daily as needed. For pain or headache    [provider]  azithromycin (ZITHROMAX Z-PAK) 250 MG tablet As directed 11/16/21   Hassell Done, Mary-Margaret, FNP  baclofen (LIORESAL) 10 MG tablet Take 10 mg by mouth 3 (three) times daily. 03/06/21   [provider]  benzonatate (TESSALON) 100 MG capsule Take 1 capsule (100 mg total) by mouth 2 (two) times daily as needed for cough. 11/16/21   Hassell Done Mary-Margaret, FNP  Blood Glucose Monitoring Suppl (ACCU-CHEK GUIDE ME) w/Device KIT USE AS DIRECTED UP TO 4 TIMES DAILY AS DIRECTED Dx E11.9 09/25/21   Hassell Done, Mary-Margaret, FNP  busPIRone (BUSPAR) 7.5 MG tablet Take 1 tablet (7.5 mg total) by mouth daily. 10/05/21   Hassell Done Mary-Margaret, FNP  cyclobenzaprine (FLEXERIL) 10 MG tablet Take 1 tablet (10 mg total) by mouth 3 (three) times daily. 01/05/16   Lily Kocher, PA-C  diclofenac Sodium (VOLTAREN) 1 % GEL SMARTSIG:1 Application Topical 4 Times Daily PRN 04/25/21   [provider]  ezetimibe (ZETIA) 10 MG tablet Take 1 tablet (10 mg total) by mouth daily. TAKE 1 TABLET BY MOUTH ONCE DAILY 06/29/21   Hassell Done, Mary-Margaret, FNP  FLUoxetine (PROZAC) 40 MG capsule Take  1 capsule (40 mg total) by mouth 2 (two) times daily. 06/29/21   Hassell Done, Mary-Margaret, FNP  hydrochlorothiazide (HYDRODIURIL) 25 MG tablet Take 1 tablet (25 mg total) by mouth daily. 06/29/21   Hassell Done Mary-Margaret, FNP  HYDROcodone-acetaminophen (NORCO) 10-325 MG tablet Take 1 tablet by mouth 4 (four) times daily as needed. 10/26/21   [provider]  ibuprofen (ADVIL,MOTRIN) 200 MG tablet Take 200 mg by mouth every 6 (six) hours as needed. Takes 800 mg when needed for menstrual cramping    [provider]  Incontinence Supplies KIT Patient needs Pull ups, chuck pads and wipes. 06/22/19    Hassell Done, Mary-Margaret, FNP  lisinopril (ZESTRIL) 10 MG tablet Take 1 tablet (10 mg total) by mouth daily. 06/29/21   Hassell Done Mary-Margaret, FNP  metFORMIN (GLUCOPHAGE) 500 MG tablet Take 1 tablet (500 mg total) by mouth 2 (two) times daily with a meal. 06/29/21   Hassell Done, Mary-Margaret, FNP  nitroGLYCERIN (NITROSTAT) 0.4 MG SL tablet DISSOLVE ONE TABLET UNDER THE TONGUE EVERY 5 MINUTES AS NEEDED FOR CHEST PAIN.  DO NOT EXCEED A TOTAL OF 3 DOSES IN 15 MINUTES Patient taking differently: DISSOLVE ONE TABLET UNDER THE TONGUE EVERY 5 MINUTES AS NEEDED FOR CHEST PAIN.  DO NOT EXCEED A TOTAL OF 3 DOSES IN 15 MINUTES 05/06/20   Hassell Done, Mary-Margaret, FNP  omeprazole (PRILOSEC) 40 MG capsule Take 1 capsule (40 mg total) by mouth daily. 06/29/21   Hassell Done, Mary-Margaret, FNP  Semaglutide, 2 MG/DOSE, 8 MG/3ML SOPN Inject 2 mg as directed once a week. 11/16/21   Hassell Done, Mary-Margaret, FNP  silver sulfADIAZINE (SILVADENE) 1 % cream Apply 1 application. topically 2 (two) times daily. 03/16/21   [provider]  simvastatin (ZOCOR) 40 MG tablet TAKE 1 TABLET BY MOUTH ONCE DAILY AT 6 PM 10/26/21   Chevis Pretty, FNP  SYMBICORT 80-4.5 MCG/ACT inhaler Inhale 2 puffs into the lungs 2 (two) times daily. 06/29/21   Hassell Done, Mary-Margaret, FNP  traMADol (ULTRAM-ER) 100 MG 24 hr tablet Take 100 mg by mouth daily. 05/28/21   [provider]  zolpidem (AMBIEN) 10 MG tablet Take 1 tablet (10 mg total) by mouth at bedtime. 11/16/21   Chevis Pretty, FNP      Allergies    Patient has no known allergies.    Review of Systems   Review of Systems  Constitutional:  Negative for fever.  Respiratory:  Positive for shortness of breath.   Cardiovascular:  Negative for chest pain and palpitations.  Genitourinary:  Positive for flank pain. Negative for dysuria and hematuria.  Musculoskeletal:  Positive for back pain.  Skin:  Negative for rash.  All other systems reviewed and are negative.   Physical  Exam Updated Vital Signs BP 128/77 (BP Location: Left Arm)   Pulse 85   Temp 97.9 F (36.6 C) (Oral)   Resp 15   Ht $R'5\' 2"'Pq$  (1.575 m)   Wt 88.5 kg   SpO2 94%   BMI 35.67 kg/m  Physical Exam Vitals and nursing note reviewed.  Constitutional:      General: She is not in acute distress.    Appearance: Normal appearance. She is normal weight. She is not ill-appearing or toxic-appearing.  HENT:     Head: Normocephalic and atraumatic.  Eyes:     General: No scleral icterus.    Extraocular Movements: Extraocular movements intact.  Cardiovascular:     Rate and Rhythm: Normal rate and regular rhythm.     Pulses: Normal pulses.     Heart  sounds: No murmur heard. Pulmonary:     Effort: Pulmonary effort is normal. No respiratory distress.     Breath sounds: Normal breath sounds. No wheezing, rhonchi or rales.  Chest:     Chest wall: No tenderness.  Abdominal:     General: Bowel sounds are normal. There is no distension.     Palpations: Abdomen is soft.     Tenderness: There is right CVA tenderness.  Musculoskeletal:        General: Tenderness present. No swelling. Normal range of motion.     Cervical back: Neck supple.       Back:     Comments: Pain to the identified area on palpation. No rash present. No bruising or swelling.   Skin:    General: Skin is warm and dry.     Capillary Refill: Capillary refill takes less than 2 seconds.     Findings: No rash.  Neurological:     General: No focal deficit present.     Mental Status: She is alert and oriented to person, place, and time. Mental status is at baseline.  Psychiatric:        Mood and Affect: Mood normal.        Behavior: Behavior normal.        Thought Content: Thought content normal.        Judgment: Judgment normal.    ED Results / Procedures / Treatments   Labs (all labs ordered are listed, but only abnormal results are displayed) Labs Reviewed  COMPREHENSIVE METABOLIC PANEL - Abnormal; Notable for the following  components:      Result Value   Glucose, Bld 188 (*)    All other components within normal limits  CBC WITH DIFFERENTIAL/PLATELET - Abnormal; Notable for the following components:   WBC 10.9 (*)    Platelets 407 (*)    All other components within normal limits  D-DIMER, QUANTITATIVE  URINALYSIS, ROUTINE W REFLEX MICROSCOPIC    EKG None  Radiology No results found.  Procedures Procedures    Medications Ordered in ED Medications - No data to display  ED Course/ Medical Decision Making/ A&P                           Medical Decision Making Amount and/or Complexity of Data Reviewed Labs: ordered.  Risk Prescription drug management.  This patient presents to the ED with chief complaint(s) of back pain with pertinent past medical history of hypertension, stroke, diabetes, COPD which further complicates the presenting complaint. The complaint involves an extensive differential diagnosis and treatment options and also carries with it a high risk of complications and morbidity.    The differential diagnosis includes Fracture, subluxation, musculoskeletal strain, epidural abscess, cauda equina, muscle spasm, sciatica or radiculopathy, etc.     Additional history obtained: Additional history obtained from  none available Records reviewed Care Everywhere/External Records and Primary Care Documents  ED Course: Lab Tests: I Ordered, and personally interpreted labs.  The pertinent results include:   UA without UTI or hemoglobin CMP without electrolyte dysfunction, AKI or transaminitis CBC with very very minor increase in white blood cells just out of range at 10.9, nonspecific D-dimer negative Imaging Studies: Not indicated Cardiac Monitoring: Not indicated Medicines ordered and prescription drug management: Prescribed prednisone.  Patient is a pain medicine patient so will not prescribe her any narcotics.  She states that she is normally prescribed steroids with her pain  medication but has  not been able to get in touch with her pain medicine physician to have this refilled.  I discussed this with Dr. Roderic Palau who feels that it is reasonable to prescribe her her prednisone so this was done.  Reassessment and review: 56 year old female who presents to the emergency department with right-sided flank, paraspinal back pain.  Her physical exam is remarkable for tenderness to palpation of the right flank.  It is not CVA tenderness with indirect strike to the back.  With simple palpation of the hand she has reproducible pain that is similar to the pain that she has had. There is no rash concerning for herpes zoster. There is no bruising or swelling. Given the location of her pain, I will obtain basic labs including a urine to evaluate for hemoglobin for possible stone.  Also will evaluate for sign of infection or AKI.  Also obtain a D-dimer for possible underlying PE.  She has notes in triage about having shortness of breath but this is specifically when she is having pain and she describes it as her breath being taken from her only when she has short bursts of severe pain.  She does not have shortness of breath at rest or dyspnea on exertion.  She does not feel palpitations or have chest pain.  I have low suspicion that this is PE.  UA does not show any hemoglobin or UTI.  I doubt that this is stone, infected stone, obstructed stone, pyelonephritis.  D-dimer is negative so doubt PE.  No evidence of infection or abnormal electrolytes.  Again there is no rash concerning for zoster.  Symptoms would be very highly inconsistent with something like ACS.  There is been no trauma so did not pursue a chest x-ray to evaluate for any rib fractures. Think that this is likely musculoskeletal pain.  We discussed that I will not be prescribing her narcotic pain medication as she is a pain medicine clinic patient.  I will prescribe her her prednisone that she usually takes with her narcotics after  discussing this with Dr. Roderic Palau.  She is in agreement with this plan.  Also instructed to use heat and gentle massage.  Given return precautions if her symptoms begin to worsen.  She verbalized understanding.  Safe for discharge  Consultations Obtained: Not indicated  Disposition: After consideration of the diagnostic results and the patient's response to treatment,  I feel that the patent would benefit from discharge home .  Social Determinants of Health: Patient's  none identified   increases the complexity of managing their presentation  Final Clinical Impression(s) / ED Diagnoses Final diagnoses:  Muscle strain    Rx / DC Orders ED Discharge Orders          Ordered    predniSONE (STERAPRED UNI-PAK 21 TAB) 10 MG (21) TBPK tablet  Daily        11/17/21 1717              Mickie Hillier, PA-C 11/18/21 1519    Milton Ferguson, MD 11/22/21 [phone]

## 2021-11-17 NOTE — ED Triage Notes (Signed)
Pt in c/o L lower back pain, pt states, "It hurts at my left lower lung." Onset yesterday, pt reports SOB with her pain, pain described as intermittent pain, A&O x4

## 2021-11-29 NOTE — Progress Notes (Signed)
Attempted to call patient to schedule LDCT. Unable to reach patient directly. Detailed VM left asking that the patient return my call. 

## 2021-12-22 ENCOUNTER — Other Ambulatory Visit (HOSPITAL_COMMUNITY): Payer: Self-pay

## 2021-12-23 ENCOUNTER — Other Ambulatory Visit (HOSPITAL_COMMUNITY): Payer: Self-pay

## 2021-12-23 MED ORDER — HYDROCODONE-ACETAMINOPHEN 10-325 MG PO TABS
1.0000 | ORAL_TABLET | Freq: Four times a day (QID) | ORAL | 0 refills | Status: DC | PRN
  Filled 2021-12-23: qty 120, 30d supply, fill #0

## 2021-12-26 ENCOUNTER — Other Ambulatory Visit (HOSPITAL_COMMUNITY): Payer: Self-pay

## 2021-12-28 ENCOUNTER — Other Ambulatory Visit (HOSPITAL_COMMUNITY): Payer: Self-pay

## 2022-01-01 ENCOUNTER — Other Ambulatory Visit: Payer: Self-pay | Admitting: Nurse Practitioner

## 2022-01-01 DIAGNOSIS — F5101 Primary insomnia: Secondary | ICD-10-CM

## 2022-01-01 MED ORDER — ZOLPIDEM TARTRATE 10 MG PO TABS: 10.0000 mg | ORAL_TABLET | Freq: Every day | ORAL | 0 refills | Status: DC

## 2022-01-01 NOTE — Telephone Encounter (Signed)
  Prescription Request  01/01/2022  Is this a "Controlled Substance" medicine? yes  Have you seen your PCP in the last 2 weeks? Pt was seen in September and has appt 11-10/ mmm nurse said to put note in  If YES, route message to pool  -  If NO, patient needs to be scheduled for appointment.  What is the name of the medication or equipment? ambien  Have you contacted your pharmacy to request a refill? Pt wants to change pharmacy   Which pharmacy would you like this sent to? [facility]   Patient notified that their request is being sent to the clinical staff for review and that they should receive a response within 2 business days.

## 2022-01-01 NOTE — Telephone Encounter (Signed)
Pt's last OV & Rx on 11/16/21 sent to [facility], wants to change pharmacy to [facility]. Next OV 01/05/22 Please advise

## 2022-01-05 ENCOUNTER — Ambulatory Visit: Payer: Medicaid Other | Admitting: Nurse Practitioner

## 2022-01-05 ENCOUNTER — Other Ambulatory Visit (HOSPITAL_COMMUNITY): Payer: Self-pay

## 2022-01-06 ENCOUNTER — Emergency Department (HOSPITAL_COMMUNITY)
Admission: EM | Admit: 2022-01-06 | Discharge: 2022-01-06 | Disposition: A | Discharge status: Home / Self Care | Payer: Medicaid Other | Attending: Emergency Medicine | Admitting: Emergency Medicine

## 2022-01-06 ENCOUNTER — Encounter (HOSPITAL_COMMUNITY): Payer: Self-pay | Admitting: Emergency Medicine

## 2022-01-06 ENCOUNTER — Other Ambulatory Visit: Payer: Self-pay

## 2022-01-06 DIAGNOSIS — I2510 Atherosclerotic heart disease of native coronary artery without angina pectoris: Secondary | ICD-10-CM | POA: Insufficient documentation

## 2022-01-06 DIAGNOSIS — R040 Epistaxis: Secondary | ICD-10-CM | POA: Diagnosis not present

## 2022-01-06 DIAGNOSIS — Z7984 Long term (current) use of oral hypoglycemic drugs: Secondary | ICD-10-CM | POA: Diagnosis not present

## 2022-01-06 DIAGNOSIS — Z79899 Other long term (current) drug therapy: Secondary | ICD-10-CM | POA: Insufficient documentation

## 2022-01-06 DIAGNOSIS — R519 Headache, unspecified: Secondary | ICD-10-CM | POA: Insufficient documentation

## 2022-01-06 DIAGNOSIS — G44209 Tension-type headache, unspecified, not intractable: Secondary | ICD-10-CM

## 2022-01-06 DIAGNOSIS — I10 Essential (primary) hypertension: Secondary | ICD-10-CM | POA: Diagnosis not present

## 2022-01-06 DIAGNOSIS — E119 Type 2 diabetes mellitus without complications: Secondary | ICD-10-CM | POA: Insufficient documentation

## 2022-01-06 DIAGNOSIS — Z7982 Long term (current) use of aspirin: Secondary | ICD-10-CM | POA: Diagnosis not present

## 2022-01-06 DIAGNOSIS — J449 Chronic obstructive pulmonary disease, unspecified: Secondary | ICD-10-CM | POA: Diagnosis not present

## 2022-01-06 MED ORDER — HYDROCODONE-ACETAMINOPHEN 5-325 MG PO TABS
1.0000 | ORAL_TABLET | Freq: Once | ORAL | Status: AC
  Administered 2022-01-06: 1 via ORAL
  Filled 2022-01-06: qty 1

## 2022-01-06 MED ORDER — PREDNISONE 20 MG PO TABS
40.0000 mg | ORAL_TABLET | Freq: Once | ORAL | Status: AC
  Administered 2022-01-06: 40 mg via ORAL
  Filled 2022-01-06: qty 2

## 2022-01-06 NOTE — Discharge Instructions (Signed)
You have been seen today for your complaint of Headache and nosebleed. Home care instructions are as follows:  Drink plenty fluids.  Stay in a cool dark and quiet room as much as possible until headache resolves Follow up with: Primary care provider Please seek immediate medical care if you develop any of the following symptoms: You all of a sudden get a very bad headache with any of these things: A stiff neck. Feeling like you may vomit. Vomiting. Feeling mixed up (confused). Feeling weak in one part or one side of your body. Having trouble seeing or speaking, or both. Feeling short of breath. A rash. Feeling very sleepy. Pain in your eye or ear. Trouble walking or balancing. Feeling like you will faint, or you faint. At this time there does not appear to be the presence of an emergent medical condition, however there is always the potential for conditions to change. Please read and follow the below instructions.  Do not take your medicine if  develop an itchy rash, swelling in your mouth or lips, or difficulty breathing; call 911 and seek immediate emergency medical attention if this occurs.  You may review your lab tests and imaging results in their entirety on your MyChart account.  Please discuss all results of fully with your primary care provider and other specialist at your follow-up visit.  Note: Portions of this text may have been transcribed using voice recognition software. Every effort was made to ensure accuracy; however, inadvertent computerized transcription errors may still be present.

## 2022-01-06 NOTE — ED Provider Notes (Signed)
[facility] EMERGENCY DEPARTMENT Provider Note   CSN: [id number] Arrival date & time: 01/06/22  1219     History  Chief Complaint  Patient presents with   Headache    Madison Gentry is a 56 y.o. female.  With history of stroke, hypertension, diabetes, COPD, CAD who presents to the ED for evaluation of headache and nosebleed.  Patient states she woke up from sleep at approximately 8:00 this morning with an intense headache that she rated at a 12 out of 10.  Describes the pain as a headband distribution and an aching.  Patient does take Norco up to 6 hours for chronic pain.  She took her Norco today and it reduced the headache from a 12 out of 10 to an 8 out of 10.  Not the worst headache of her life.  Headache is worsened with bright lights.  Also complaining of nausea at the time of headache onset, however this has resolved.  Also reports a nosebleed that occurred just after headache began.  Patient states she typically does not get nosebleeds.  She states she laid back down in bed for another hour, but then when she sat back up the nosebleed began again.  This lasted for 5 to 10 minutes.  She stopped it with a tissue and direct pressure.  Denies dizziness, lightheadedness, blurred vision, vision changes, numbness, weakness, tingling, chest pain, shortness of breath, vomiting, abdominal pain.   Headache      Home Medications Prior to Admission medications   Medication Sig Start Date End Date Taking? Authorizing Provider  albuterol (VENTOLIN HFA) 108 (90 Base) MCG/ACT inhaler Inhale 2 puffs into the lungs every 6 (six) hours as needed for wheezing or shortness of breath. 06/01/21  Yes Gwenlyn Perking, FNP  amLODipine (NORVASC) 10 MG tablet Take 1 tablet (10 mg total) by mouth daily. 06/29/21  Yes Martin, Mary-Margaret, FNP  Aspirin-Acetaminophen-Caffeine 520-260-32.5 MG PACK Take 1 packet by mouth 2 (two) times daily as needed. For pain or headache   Yes [provider]  baclofen  (LIORESAL) 10 MG tablet Take 10 mg by mouth 3 (three) times daily. 03/06/21  Yes [provider]  busPIRone (BUSPAR) 7.5 MG tablet Take 1 tablet (7.5 mg total) by mouth daily. 10/05/21  Yes Hassell Done, Mary-Margaret, FNP  cyclobenzaprine (FLEXERIL) 10 MG tablet Take 1 tablet (10 mg total) by mouth 3 (three) times daily. 01/05/16  Yes Lily Kocher, PA-C  diclofenac Sodium (VOLTAREN) 1 % GEL Apply 2 g topically 4 (four) times daily as needed (pain). 04/25/21  Yes [provider]  ezetimibe (ZETIA) 10 MG tablet Take 1 tablet (10 mg total) by mouth daily. TAKE 1 TABLET BY MOUTH ONCE DAILY 06/29/21  Yes Hassell Done, Mary-Margaret, FNP  FLUoxetine (PROZAC) 40 MG capsule Take 1 capsule (40 mg total) by mouth 2 (two) times daily. 06/29/21  Yes Martin, Mary-Margaret, FNP  hydrochlorothiazide (HYDRODIURIL) 25 MG tablet Take 1 tablet (25 mg total) by mouth daily. 06/29/21  Yes Hassell Done, Mary-Margaret, FNP  HYDROcodone-acetaminophen (NORCO) 10-325 MG tablet Take 1 Tablet by mouth four times daily, as needed Patient taking differently: Take 1 tablet by mouth 4 (four) times daily as needed for moderate pain. 12/22/21  Yes   ibuprofen (ADVIL,MOTRIN) 200 MG tablet Take 200 mg by mouth every 6 (six) hours as needed. Takes 800 mg when needed for menstrual cramping   Yes [provider]  lisinopril (ZESTRIL) 10 MG tablet Take 1 tablet (10 mg total) by mouth daily. 06/29/21  Yes  Hassell Done, Mary-Margaret, FNP  metFORMIN (GLUCOPHAGE) 500 MG tablet Take 1 tablet (500 mg total) by mouth 2 (two) times daily with a meal. 06/29/21  Yes Hassell Done, Mary-Margaret, FNP  nitroGLYCERIN (NITROSTAT) 0.4 MG SL tablet DISSOLVE ONE TABLET UNDER THE TONGUE EVERY 5 MINUTES AS NEEDED FOR CHEST PAIN.  DO NOT EXCEED A TOTAL OF 3 DOSES IN 15 MINUTES Patient taking differently: DISSOLVE ONE TABLET UNDER THE TONGUE EVERY 5 MINUTES AS NEEDED FOR CHEST PAIN.  DO NOT EXCEED A TOTAL OF 3 DOSES IN 15 MINUTES 05/06/20  Yes Hassell Done, Mary-Margaret, FNP   omeprazole (PRILOSEC) 40 MG capsule Take 1 capsule (40 mg total) by mouth daily. 06/29/21  Yes Martin, Mary-Margaret, FNP  predniSONE (DELTASONE) 10 MG tablet Take 10-60 mg by mouth See admin instructions. Take 6-5-4-3-2-1 tablets daily. (Takes with hydrocodone to help with back pain.) 11/17/21  Yes [provider]  Semaglutide, 2 MG/DOSE, 8 MG/3ML SOPN Inject 2 mg as directed once a week. Patient taking differently: Inject 2 mg as directed once a week. Takes on Fridays. 11/16/21  Yes Hassell Done, Mary-Margaret, FNP  silver sulfADIAZINE (SILVADENE) 1 % cream Apply 1 application  topically 2 (two) times daily as needed (rash/skin irritation). 03/16/21  Yes [provider]  simvastatin (ZOCOR) 40 MG tablet TAKE 1 TABLET BY MOUTH ONCE DAILY AT 6 PM Patient taking differently: Take 40 mg by mouth at bedtime. 10/26/21  Yes Hassell Done, Mary-Margaret, FNP  SYMBICORT 80-4.5 MCG/ACT inhaler Inhale 2 puffs into the lungs 2 (two) times daily. 06/29/21  Yes Martin, Mary-Margaret, FNP  traMADol (ULTRAM-ER) 100 MG 24 hr tablet Take 100 mg by mouth daily. 05/28/21  Yes [provider]  zolpidem (AMBIEN) 10 MG tablet Take 1 tablet (10 mg total) by mouth at bedtime. 01/01/22  Yes Martin, Mary-Margaret, FNP  ACCU-CHEK GUIDE test strip USE 1 UP TO 4 TIMES DAILY AS DIRECTED 03/02/21   [provider]  Accu-Chek Softclix Lancets lancets Use as directed up to 4 times daily as directed Dx E11.9 09/25/21   Hassell Done, Mary-Margaret, FNP  Blood Glucose Monitoring Suppl (ACCU-CHEK GUIDE ME) w/Device KIT USE AS DIRECTED UP TO 4 TIMES DAILY AS DIRECTED Dx E11.9 09/25/21   Chevis Pretty, FNP  Incontinence Supplies KIT Patient needs Pull ups, chuck pads and wipes. 06/22/19   Chevis Pretty, FNP      Allergies    Butrans [buprenorphine]    Review of Systems   Review of Systems  Neurological:  Positive for headaches.  All other systems reviewed and are negative.   Physical Exam Updated Vital  Signs There were no vitals taken for this visit. Physical Exam Vitals and nursing note reviewed.  Constitutional:      General: She is not in acute distress.    Appearance: Normal appearance. She is normal weight. She is not ill-appearing.  HENT:     Head: Normocephalic and atraumatic.     Nose:     Right Nostril: No epistaxis.     Left Nostril: No epistaxis.     Mouth/Throat:     Mouth: Mucous membranes are moist.     Pharynx: Oropharynx is clear.     Comments: No signs of posterior epistaxis Eyes:     Extraocular Movements: Extraocular movements intact.     Pupils: Pupils are equal, round, and reactive to light.     Comments: No nystagmus  Pulmonary:     Effort: Pulmonary effort is normal. No respiratory distress.  Abdominal:     General: Abdomen is  flat.  Musculoskeletal:        General: Normal range of motion.     Cervical back: Normal range of motion and neck supple. No rigidity.  Skin:    General: Skin is warm and dry.  Neurological:     Mental Status: She is alert and oriented to person, place, and time.     Comments: No unilateral or global weakness, facial asymmetry, slurred speech, pronator drift.  Sensation and heel-to-shin normal.  Psychiatric:        Mood and Affect: Mood normal.        Behavior: Behavior normal.     ED Results / Procedures / Treatments   Labs (all labs ordered are listed, but only abnormal results are displayed) Labs Reviewed - No data to display  EKG None  Radiology No results found.  Procedures Procedures    Medications Ordered in ED Medications  predniSONE (DELTASONE) tablet 40 mg (has no administration in time range)  HYDROcodone-acetaminophen (NORCO/VICODIN) 5-325 MG per tablet 1 tablet (has no administration in time range)    ED Course/ Medical Decision Making/ A&P                           Medical Decision Making Risk Prescription drug management.  This patient presents to the ED for concern of headache, this  involves an extensive number of treatment options, and is a complaint that carries with it a high risk of complications and morbidity.  Emergent considerations for headache include subarachnoid hemorrhage, meningitis, temporal arteritis, glaucoma, cerebral ischemia, carotid/vertebral dissection, intracranial tumor, Venous sinus thrombosis, carbon monoxide poisoning, acute or chronic subdural hemorrhage.  Other considerations include: Migraine, Cluster headache, Hypertension, Caffeine, alcohol, or drug withdrawal, Pseudotumor cerebri, Arteriovenous malformation, Head injury, Neurocysticercosis, Post-lumbar puncture, Preeclampsia, Tension headache, Sinusitis, Cervical arthritis, Refractive error causing strain, Dental abscess, Otitis media, Temporomandibular joint syndrome, Depression, Somatoform disorder (eg, somatization) Trigeminal neuralgia, Glossopharyngeal neuralgia.    Co morbidities that complicate the patient evaluation   stroke, hypertension, diabetes, COPD, CAD   Additional history obtained from: Nursing notes from this visit.  Afebrile, hemodynamically stable.  Patient is a 56 year old female presenting to the ED for evaluation of acute headache epistaxis.  Epistaxis was controlled prior to arrival with direct pressure.  Patient initially reported headache that she rated a 12 out of 10 and may have been distribution that was reduced to an 8 out of 10 at home with her scheduled Norco.  Physical exam unremarkable, specifically neurologic exam unremarkable and I have low suspicion for acute intracranial abnormalities.  Shared decision making conversation was had with patient regarding ED headache treatment.  Patient states that she does not like getting poked by needles and lines IV treatment as well as IM treatment.  Patient requested her scheduled Norco as it is about time for this.  She states she has been treated with steroids in the past for her headaches and left-sided chronic pains and is  requesting this today.  Patient was advised that p.o. steroids are not first-line for headaches, but patient still requesting to proceed in this direction.  This was accommodated.  She reported her headache symptoms to improve after these treatments and is requesting to be discharged.  Was also requesting steroid taper pack, but was informed that this is not an appropriate treatment for an acute headache. Patient was understanding.  No epistaxis at the time of my evaluation.  No signs of posterior epistaxis.  Patient is not  on blood thinners.  Patient was given return precautions.  Informed follow-up with her primary care provider.  Stable at discharge.   At this time there does not appear to be any evidence of an acute emergency medical condition and the patient appears stable for discharge with appropriate outpatient follow up. Diagnosis was discussed with patient who verbalizes understanding of care plan and is agreeable to discharge. I have discussed return precautions with patient who verbalizes understanding. Patient encouraged to follow-up with their PCP within 1 week. All questions answered.  Patient's case discussed with Dr. Roderic Palau who agrees with plan to discharge with follow-up.   Note: Portions of this report may have been transcribed using voice recognition software. Every effort was made to ensure accuracy; however, inadvertent computerized transcription errors may still be present.          Final Clinical Impression(s) / ED Diagnoses Final diagnoses:  None    Rx / DC Orders ED Discharge Orders     None         Roylene Reason, Hershal Coria 01/06/22 1519    Wyvonnia Dusky, MD 01/07/22 [phone]

## 2022-01-06 NOTE — ED Triage Notes (Signed)
Pt reports waking up this morning with a headache and nose bleed this morning. Denies blood thinner use.

## 2022-01-08 ENCOUNTER — Ambulatory Visit: Payer: Medicaid Other | Admitting: Nurse Practitioner

## 2022-01-08 ENCOUNTER — Encounter: Payer: Self-pay | Admitting: Nurse Practitioner

## 2022-01-08 VITALS — BP 125/72 | HR 92 | Temp 97.4°F | Ht 62.0 in | Wt 194.8 lb

## 2022-01-08 DIAGNOSIS — I10 Essential (primary) hypertension: Secondary | ICD-10-CM | POA: Diagnosis not present

## 2022-01-08 DIAGNOSIS — M545 Low back pain, unspecified: Secondary | ICD-10-CM

## 2022-01-08 DIAGNOSIS — E7800 Pure hypercholesterolemia, unspecified: Secondary | ICD-10-CM | POA: Diagnosis not present

## 2022-01-08 DIAGNOSIS — I25119 Atherosclerotic heart disease of native coronary artery with unspecified angina pectoris: Secondary | ICD-10-CM

## 2022-01-08 DIAGNOSIS — F5101 Primary insomnia: Secondary | ICD-10-CM

## 2022-01-08 DIAGNOSIS — E1142 Type 2 diabetes mellitus with diabetic polyneuropathy: Secondary | ICD-10-CM

## 2022-01-08 DIAGNOSIS — G8929 Other chronic pain: Secondary | ICD-10-CM

## 2022-01-08 DIAGNOSIS — K219 Gastro-esophageal reflux disease without esophagitis: Secondary | ICD-10-CM

## 2022-01-08 DIAGNOSIS — N393 Stress incontinence (female) (male): Secondary | ICD-10-CM

## 2022-01-08 DIAGNOSIS — F411 Generalized anxiety disorder: Secondary | ICD-10-CM

## 2022-01-08 DIAGNOSIS — F3341 Major depressive disorder, recurrent, in partial remission: Secondary | ICD-10-CM

## 2022-01-08 DIAGNOSIS — J411 Mucopurulent chronic bronchitis: Secondary | ICD-10-CM

## 2022-01-08 MED ORDER — OMEPRAZOLE 40 MG PO CPDR: 40.0000 mg | DELAYED_RELEASE_CAPSULE | Freq: Every day | ORAL | 1 refills | Status: DC

## 2022-01-08 MED ORDER — SIMVASTATIN 40 MG PO TABS: ORAL_TABLET | ORAL | 1 refills | Status: DC

## 2022-01-08 MED ORDER — SYMBICORT 80-4.5 MCG/ACT IN AERO: 2.0000 | INHALATION_SPRAY | Freq: Two times a day (BID) | RESPIRATORY_TRACT | 3 refills | Status: DC

## 2022-01-08 MED ORDER — AMLODIPINE BESYLATE 10 MG PO TABS: 10.0000 mg | ORAL_TABLET | Freq: Every day | ORAL | 1 refills | Status: DC

## 2022-01-08 MED ORDER — BUSPIRONE HCL 7.5 MG PO TABS: 7.5000 mg | ORAL_TABLET | Freq: Every day | ORAL | 2 refills | Status: DC

## 2022-01-08 MED ORDER — ZOLPIDEM TARTRATE 10 MG PO TABS: 10.0000 mg | ORAL_TABLET | Freq: Every day | ORAL | 0 refills | Status: DC

## 2022-01-08 MED ORDER — LISINOPRIL 10 MG PO TABS: 10.0000 mg | ORAL_TABLET | Freq: Every day | ORAL | 1 refills | Status: DC

## 2022-01-08 MED ORDER — EZETIMIBE 10 MG PO TABS: 10.0000 mg | ORAL_TABLET | Freq: Every day | ORAL | 1 refills | Status: DC

## 2022-01-08 MED ORDER — HYDROCHLOROTHIAZIDE 25 MG PO TABS: 25.0000 mg | ORAL_TABLET | Freq: Every day | ORAL | 1 refills | Status: DC

## 2022-01-08 MED ORDER — FLUOXETINE HCL 40 MG PO CAPS: 40.0000 mg | ORAL_CAPSULE | Freq: Two times a day (BID) | ORAL | 1 refills | Status: DC

## 2022-01-08 MED ORDER — METFORMIN HCL 500 MG PO TABS: 500.0000 mg | ORAL_TABLET | Freq: Two times a day (BID) | ORAL | 1 refills | Status: DC

## 2022-01-08 NOTE — Progress Notes (Signed)
Subjective:    Patient ID: Madison Gentry, female    DOB: Feb 02, 1966, 56 y.o.   MRN: [id number]   Chief Complaint: medical management of chronic issues     HPI:  Madison Gentry is a 56 y.o. who identifies as a female who was assigned female at birth.   Social history: Lives with: husband kids and grand kids Work history: disability   Comes in today for follow up of the following chronic medical issues:  1. Type 2 diabetes mellitus with diabetic polyneuropathy, without long-term current use of insulin ([place]) She dies not check her blood sugars at home. Denies symptoms on any low blood sugars. Does not watch diet very closely.she was out of ozempic because it was on bACK [place] Lab Results  Component Value Date   HGBA1C 6.6 (H) 10/05/2021     2. Primary hypertension No c/o chest pain, sob or headache. Does not check blood pressure at home. BP Readings from Last 3 Encounters:  01/06/22 134/85  11/17/21 128/77  11/16/21 111/67     3. Pure hypercholesterolemia Does not watch diet and does no dedicated exercise. Lab Results  Component Value Date   CHOL 139 10/05/2021   HDL 47 10/05/2021   LDLCALC 66 10/05/2021   TRIG 151 (H) 10/05/2021   CHOLHDL 3.0 10/05/2021     4. Atherosclerosis of native coronary artery of native heart with angina pectoris [facility]) Has not seen cardiology  5. Mucopurulent chronic bronchitis (HCC) Has chronic cough. Uses symbicort.  6. Gastroesophageal reflux disease without esophagitis Is on omeprazole daily and is doing well.   7. Recurrent major depressive disorder, in partial remission ([place]) Is on prozac and is doing well.    11/16/2021   10:51 AM 10/05/2021   11:56 AM 07/21/2021   11:46 AM  Depression screen PHQ 2/9  Decreased Interest _0 Down, Depressed, Hopeless 2 0 2  PHQ - 2 Score _1 Altered sleeping _2 Tired, decreased energy _3 Change in appetite 1 1 0  Feeling bad or failure about yourself  0 0 0   Trouble concentrating 0 0 0  Moving slowly or fidgety/restless 0 0 1  Suicidal thoughts 0 0 0  PHQ-9 Score _4 Difficult doing work/chores Very difficult Very difficult Extremely dIfficult    8. GAD (generalized anxiety disorder) Is on buspar bid    11/16/2021   10:51 AM 10/05/2021   11:56 AM 07/21/2021   11:46 AM 06/29/2021    8:58 AM  GAD 7 : Generalized Anxiety Score  Nervous, Anxious, on Edge _5 0  Control/stop worrying 1 0 0 0  Worry too much - different things 1 0 0 2  Trouble relaxing _6 Restless 0 0 0 0  Easily annoyed or irritable 1 2 0 3  Afraid - awful might happen 1 0 0 0  Total GAD 7 Score _7 Anxiety Difficulty Somewhat difficult Somewhat difficult Extremely difficult      9. Stress incontinence of urine Use depends and wipes  10. Primary insomnia Is on ambien to sleep at night. Sleeps about 8 hours a night  11. Chronic midline low back pain without sciatica Sees pain management  12. Morbid obesity ([place]) No recent weight changes Wt Readings from Last 3 Encounters:  01/08/22 194 lb 12.8 oz (88.4 kg)  11/17/21 195 lb (88.5 kg)  11/16/21  195 lb (88.5 kg)   BMI Readings from Last 3 Encounters:  01/08/22 35.63 kg/m  11/17/21 35.67 kg/m  11/16/21 35.67 kg/m      New complaints: Had nose bled Sunday and had to go to the ED.   Allergies  Allergen Reactions   Butrans [Buprenorphine] Other (See Comments)    Skin irritation   Outpatient Encounter Medications as of 01/08/2022  Medication Sig   ACCU-CHEK GUIDE test strip USE 1 UP TO 4 TIMES DAILY AS DIRECTED   Accu-Chek Softclix Lancets lancets Use as directed up to 4 times daily as directed Dx E11.9   albuterol (VENTOLIN HFA) 108 (90 Base) MCG/ACT inhaler Inhale 2 puffs into the lungs every 6 (six) hours as needed for wheezing or shortness of breath.   amLODipine (NORVASC) 10 MG tablet Take 1 tablet (10 mg total) by mouth daily.   Aspirin-Acetaminophen-Caffeine 520-260-32.5 MG PACK  Take 1 packet by mouth 2 (two) times daily as needed. For pain or headache   baclofen (LIORESAL) 10 MG tablet Take 10 mg by mouth 3 (three) times daily.   Blood Glucose Monitoring Suppl (ACCU-CHEK GUIDE ME) w/Device KIT USE AS DIRECTED UP TO 4 TIMES DAILY AS DIRECTED Dx E11.9   busPIRone (BUSPAR) 7.5 MG tablet Take 1 tablet (7.5 mg total) by mouth daily.   cyclobenzaprine (FLEXERIL) 10 MG tablet Take 1 tablet (10 mg total) by mouth 3 (three) times daily.   diclofenac Sodium (VOLTAREN) 1 % GEL Apply 2 g topically 4 (four) times daily as needed (pain).   ezetimibe (ZETIA) 10 MG tablet Take 1 tablet (10 mg total) by mouth daily. TAKE 1 TABLET BY MOUTH ONCE DAILY   FLUoxetine (PROZAC) 40 MG capsule Take 1 capsule (40 mg total) by mouth 2 (two) times daily.   hydrochlorothiazide (HYDRODIURIL) 25 MG tablet Take 1 tablet (25 mg total) by mouth daily.   HYDROcodone-acetaminophen (NORCO) 10-325 MG tablet Take 1 Tablet by mouth four times daily, as needed (Patient taking differently: Take 1 tablet by mouth 4 (four) times daily as needed for moderate pain.)   ibuprofen (ADVIL,MOTRIN) 200 MG tablet Take 200 mg by mouth every 6 (six) hours as needed. Takes 800 mg when needed for menstrual cramping   Incontinence Supplies KIT Patient needs Pull ups, chuck pads and wipes.   lisinopril (ZESTRIL) 10 MG tablet Take 1 tablet (10 mg total) by mouth daily.   metFORMIN (GLUCOPHAGE) 500 MG tablet Take 1 tablet (500 mg total) by mouth 2 (two) times daily with a meal.   nitroGLYCERIN (NITROSTAT) 0.4 MG SL tablet DISSOLVE ONE TABLET UNDER THE TONGUE EVERY 5 MINUTES AS NEEDED FOR CHEST PAIN.  DO NOT EXCEED A TOTAL OF 3 DOSES IN 15 MINUTES (Patient taking differently: DISSOLVE ONE TABLET UNDER THE TONGUE EVERY 5 MINUTES AS NEEDED FOR CHEST PAIN.  DO NOT EXCEED A TOTAL OF 3 DOSES IN 15 MINUTES)   omeprazole (PRILOSEC) 40 MG capsule Take 1 capsule (40 mg total) by mouth daily.   predniSONE (DELTASONE) 10 MG tablet Take 10-60 mg  by mouth See admin instructions. Take 6-5-4-3-2-1 tablets daily. (Takes with hydrocodone to help with back pain.)   Semaglutide, 2 MG/DOSE, 8 MG/3ML SOPN Inject 2 mg as directed once a week. (Patient taking differently: Inject 2 mg as directed once a week. Takes on Fridays.)   silver sulfADIAZINE (SILVADENE) 1 % cream Apply 1 application  topically 2 (two) times daily as needed (rash/skin irritation).   simvastatin (ZOCOR) 40 MG tablet TAKE 1 TABLET  BY MOUTH ONCE DAILY AT 6 PM (Patient taking differently: Take 40 mg by mouth at bedtime.)   SYMBICORT 80-4.5 MCG/ACT inhaler Inhale 2 puffs into the lungs 2 (two) times daily.   zolpidem (AMBIEN) 10 MG tablet Take 1 tablet (10 mg total) by mouth at bedtime.   [DISCONTINUED] traMADol (ULTRAM-ER) 100 MG 24 hr tablet Take 100 mg by mouth daily.   No facility-administered encounter medications on file as of 01/08/2022.    Past Surgical History:  Procedure Laterality Date   TUBAL LIGATION      Family History  Problem Relation Age of Onset   CAD Mother 37   CAD Father 37       Died age 79   CAD Brother 83      Controlled substance contract: n/a     Review of Systems  Constitutional:  Negative for diaphoresis.  Eyes:  Negative for pain.  Respiratory:  Negative for shortness of breath.   Cardiovascular:  Negative for chest pain, palpitations and leg swelling.  Gastrointestinal:  Negative for abdominal pain.  Endocrine: Negative for polydipsia.  Skin:  Negative for rash.  Neurological:  Negative for dizziness, weakness and headaches.  Hematological:  Does not bruise/bleed easily.  All other systems reviewed and are negative.      Objective:   Physical Exam Vitals and nursing note reviewed.  Constitutional:      General: She is not in acute distress.    Appearance: Normal appearance. She is well-developed.  HENT:     Head: Normocephalic.     Right Ear: Tympanic membrane normal.     Left Ear: Tympanic membrane normal.     Nose:  Nose normal.     Mouth/Throat:     Mouth: Mucous membranes are moist.  Eyes:     Pupils: Pupils are equal, round, and reactive to light.  Neck:     Vascular: No carotid bruit or JVD.  Cardiovascular:     Rate and Rhythm: Normal rate and regular rhythm.     Heart sounds: Normal heart sounds.  Pulmonary:     Effort: Pulmonary effort is normal. No respiratory distress.     Breath sounds: Normal breath sounds. No wheezing or rales.  Chest:     Chest wall: No tenderness.  Abdominal:     General: Bowel sounds are normal. There is no distension or abdominal bruit.     Palpations: Abdomen is soft. There is no hepatomegaly, splenomegaly, mass or pulsatile mass.     Tenderness: There is no abdominal tenderness.  Musculoskeletal:        General: Normal range of motion.     Cervical back: Normal range of motion and neck supple.  Lymphadenopathy:     Cervical: No cervical adenopathy.  Skin:    General: Skin is warm and dry.  Neurological:     Mental Status: She is alert and oriented to person, place, and time.     Deep Tendon Reflexes: Reflexes are normal and symmetric.  Psychiatric:        Behavior: Behavior normal.        Thought Content: Thought content normal.        Judgment: Judgment normal.     BP 125/72   Pulse 92   Temp (!) 97.4 F (36.3 C)   Ht 5' 2" (1.575 m)   Wt 194 lb 12.8 oz (88.4 kg)   SpO2 96%   BMI 35.63 kg/m   HGBA1c 7.8%     Assessment &  Plan:   Madison Gentry comes in today with chief complaint of Medical Management of Chronic Issues   Diagnosis and orders addressed:  1. Type 2 diabetes mellitus with diabetic polyneuropathy, without long-term current use of insulin (HCC) Continue to watch carbs in diet Back o ozempic - Bayer DCA Hb A1c Waived; Future - CBC with Differential/Platelet - CMP14+EGFR - Lipid panel - Bayer DCA Hb A1c Waived - metFORMIN (GLUCOPHAGE) 500 MG tablet; Take 1 tablet (500 mg total) by mouth 2 (two) times daily with a meal.   Dispense: 180 tablet; Refill: 1  2. Primary hypertension Low sodium diet - Bayer DCA Hb A1c Waived; Future - CBC with Differential/Platelet - CMP14+EGFR - Lipid panel - Bayer DCA Hb A1c Waived - amLODipine (NORVASC) 10 MG tablet; Take 1 tablet (10 mg total) by mouth daily.  Dispense: 90 tablet; Refill: 1 - hydrochlorothiazide (HYDRODIURIL) 25 MG tablet; Take 1 tablet (25 mg total) by mouth daily.  Dispense: 90 tablet; Refill: 1 - lisinopril (ZESTRIL) 10 MG tablet; Take 1 tablet (10 mg total) by mouth daily.  Dispense: 90 tablet; Refill: 1  3. Pure hypercholesterolemia Low fat diet - Bayer DCA Hb A1c Waived; Future - CBC with Differential/Platelet - CMP14+EGFR - Lipid panel - Bayer DCA Hb A1c Waived - simvastatin (ZOCOR) 40 MG tablet; TAKE 1 TABLET BY MOUTH ONCE DAILY AT 6 PM Strength: 40 mg  Dispense: 90 tablet; Refill: 1 - ezetimibe (ZETIA) 10 MG tablet; Take 1 tablet (10 mg total) by mouth daily. TAKE 1 TABLET BY MOUTH ONCE DAILY  Dispense: 90 tablet; Refill: 1  4. Atherosclerosis of native coronary artery of native heart with angina pectoris ([place])  5. Mucopurulent chronic bronchitis (HCC) - SYMBICORT 80-4.5 MCG/ACT inhaler; Inhale 2 puffs into the lungs 2 (two) times daily.  Dispense: 10.2 g; Refill: 3  6. Gastroesophageal reflux disease without esophagitis Avoid spicy foods Do not eat 2 hours prior to bedtime - omeprazole (PRILOSEC) 40 MG capsule; Take 1 capsule (40 mg total) by mouth daily.  Dispense: 90 capsule; Refill: 1  7. Recurrent major depressive disorder, in partial remission (HCC) Stress management - FLUoxetine (PROZAC) 40 MG capsule; Take 1 capsule (40 mg total) by mouth 2 (two) times daily.  Dispense: 180 capsule; Refill: 1  8. GAD (generalized anxiety disorder) - busPIRone (BUSPAR) 7.5 MG tablet; Take 1 tablet (7.5 mg total) by mouth daily.  Dispense: 90 tablet; Refill: 2  9. Stress incontinence of urine  10. Primary insomnia Bed time routine - zolpidem  (AMBIEN) 10 MG tablet; Take 1 tablet (10 mg total) by mouth at bedtime.  Dispense: 30 tablet; Refill: 0  11. Chronic midline low back pain without sciatica See follow up with pain management  12. Morbid obesity ([place]) Discussed diet and exercise for person with BMI >25 Will recheck weight in 3-6 months    Labs pending Health Maintenance reviewed Diet and exercise encouraged  Follow up plan: 3 months   Mary-Margaret Hassell Done, FNP

## 2022-01-09 ENCOUNTER — Telehealth: Payer: Self-pay | Admitting: Nurse Practitioner

## 2022-01-09 DIAGNOSIS — K219 Gastro-esophageal reflux disease without esophagitis: Secondary | ICD-10-CM

## 2022-01-09 DIAGNOSIS — F411 Generalized anxiety disorder: Secondary | ICD-10-CM

## 2022-01-09 DIAGNOSIS — F5101 Primary insomnia: Secondary | ICD-10-CM

## 2022-01-09 DIAGNOSIS — F3341 Major depressive disorder, recurrent, in partial remission: Secondary | ICD-10-CM

## 2022-01-09 DIAGNOSIS — I10 Essential (primary) hypertension: Secondary | ICD-10-CM

## 2022-01-09 DIAGNOSIS — E7800 Pure hypercholesterolemia, unspecified: Secondary | ICD-10-CM

## 2022-01-09 DIAGNOSIS — E1142 Type 2 diabetes mellitus with diabetic polyneuropathy: Secondary | ICD-10-CM

## 2022-01-09 DIAGNOSIS — J411 Mucopurulent chronic bronchitis: Secondary | ICD-10-CM

## 2022-01-09 LAB — CBC WITH DIFFERENTIAL/PLATELET
Basophils Absolute: 0.1 10*3/uL (ref 0.0–0.2)
Basos: 1 %
EOS (ABSOLUTE): 0.5 10*3/uL — ABNORMAL HIGH (ref 0.0–0.4)
Eos: 3 %
Hematocrit: 37.7 % (ref 34.0–46.6)
Hemoglobin: 12.4 g/dL (ref 11.1–15.9)
Immature Grans (Abs): 0 10*3/uL (ref 0.0–0.1)
Immature Granulocytes: 0 %
Lymphocytes Absolute: 4.4 10*3/uL — ABNORMAL HIGH (ref 0.7–3.1)
Lymphs: 33 %
MCH: 29.5 pg (ref 26.6–33.0)
MCHC: 32.9 g/dL (ref 31.5–35.7)
MCV: 90 fL (ref 79–97)
Monocytes Absolute: 0.8 10*3/uL (ref 0.1–0.9)
Monocytes: 6 %
Neutrophils Absolute: 7.8 10*3/uL — ABNORMAL HIGH (ref 1.4–7.0)
Neutrophils: 57 %
Platelets: 404 10*3/uL (ref 150–450)
RBC: 4.21 x10E6/uL (ref 3.77–5.28)
RDW: 13.5 % (ref 11.7–15.4)
WBC: 13.6 10*3/uL — ABNORMAL HIGH (ref 3.4–10.8)

## 2022-01-09 LAB — CMP14+EGFR
ALT: 22 IU/L (ref 0–32)
AST: 17 IU/L (ref 0–40)
Albumin/Globulin Ratio: 1.9 (ref 1.2–2.2)
Albumin: 3.9 g/dL (ref 3.8–4.9)
Alkaline Phosphatase: 64 IU/L (ref 44–121)
BUN/Creatinine Ratio: 17 (ref 9–23)
BUN: 10 mg/dL (ref 6–24)
Bilirubin Total: <0.2 mg/dL (ref 0.0–1.2)
CO2: 27 mmol/L (ref 20–29)
Calcium: 8.9 mg/dL (ref 8.7–10.2)
Chloride: 98 mmol/L (ref 96–106)
Creatinine, Ser: 0.58 mg/dL (ref 0.57–1.00)
Globulin, Total: 2.1 g/dL (ref 1.5–4.5)
Glucose: 125 mg/dL — ABNORMAL HIGH (ref 70–99)
Potassium: 4.3 mmol/L (ref 3.5–5.2)
Sodium: 138 mmol/L (ref 134–144)
Total Protein: 6 g/dL (ref 6.0–8.5)
eGFR: 107 mL/min/{1.73_m2} (ref >59)

## 2022-01-09 LAB — LIPID PANEL
Chol/HDL Ratio: 2.7 ratio (ref 0.0–4.4)
Cholesterol, Total: 152 mg/dL (ref 100–199)
HDL: 57 mg/dL (ref >39)
LDL Chol Calc (NIH): 53 mg/dL (ref 0–99)
Triglycerides: 270 mg/dL — ABNORMAL HIGH (ref 0–149)
VLDL Cholesterol Cal: 42 mg/dL — ABNORMAL HIGH (ref 5–40)

## 2022-01-09 LAB — BAYER DCA HB A1C WAIVED: HB A1C (BAYER DCA - WAIVED): 7.8 % — ABNORMAL HIGH (ref 4.8–5.6)

## 2022-01-09 MED ORDER — FLUOXETINE HCL 40 MG PO CAPS: 40.0000 mg | ORAL_CAPSULE | Freq: Two times a day (BID) | ORAL | 1 refills | Status: DC

## 2022-01-09 MED ORDER — SIMVASTATIN 40 MG PO TABS: ORAL_TABLET | ORAL | 1 refills | Status: DC

## 2022-01-09 MED ORDER — HYDROCHLOROTHIAZIDE 25 MG PO TABS: 25.0000 mg | ORAL_TABLET | Freq: Every day | ORAL | 1 refills | Status: DC

## 2022-01-09 MED ORDER — AMLODIPINE BESYLATE 10 MG PO TABS: 10.0000 mg | ORAL_TABLET | Freq: Every day | ORAL | 1 refills | Status: DC

## 2022-01-09 MED ORDER — ZOLPIDEM TARTRATE 10 MG PO TABS: 10.0000 mg | ORAL_TABLET | Freq: Every day | ORAL | 0 refills | Status: DC

## 2022-01-09 MED ORDER — METFORMIN HCL 500 MG PO TABS: 500.0000 mg | ORAL_TABLET | Freq: Two times a day (BID) | ORAL | 1 refills | Status: DC

## 2022-01-09 MED ORDER — SYMBICORT 80-4.5 MCG/ACT IN AERO: 2.0000 | INHALATION_SPRAY | Freq: Two times a day (BID) | RESPIRATORY_TRACT | 3 refills | Status: DC

## 2022-01-09 MED ORDER — BUSPIRONE HCL 7.5 MG PO TABS: 7.5000 mg | ORAL_TABLET | Freq: Every day | ORAL | 2 refills | Status: DC

## 2022-01-09 MED ORDER — OMEPRAZOLE 40 MG PO CPDR: 40.0000 mg | DELAYED_RELEASE_CAPSULE | Freq: Every day | ORAL | 1 refills | Status: DC

## 2022-01-09 MED ORDER — LISINOPRIL 10 MG PO TABS: 10.0000 mg | ORAL_TABLET | Freq: Every day | ORAL | 1 refills | Status: DC

## 2022-01-09 MED ORDER — EZETIMIBE 10 MG PO TABS: 10.0000 mg | ORAL_TABLET | Freq: Every day | ORAL | 1 refills | Status: DC

## 2022-01-09 NOTE — Telephone Encounter (Signed)
NA sent to corrected pharmacy

## 2022-01-09 NOTE — Telephone Encounter (Signed)
Ambien sent to [facility]

## 2022-01-16 ENCOUNTER — Encounter: Payer: Self-pay | Admitting: Nurse Practitioner

## 2022-01-16 ENCOUNTER — Ambulatory Visit: Payer: Medicaid Other | Admitting: Nurse Practitioner

## 2022-01-16 VITALS — BP 125/75 | HR 98 | Temp 98.8°F | Ht 62.0 in | Wt 189.0 lb

## 2022-01-16 DIAGNOSIS — J440 Chronic obstructive pulmonary disease with acute lower respiratory infection: Secondary | ICD-10-CM | POA: Diagnosis not present

## 2022-01-16 DIAGNOSIS — J209 Acute bronchitis, unspecified: Secondary | ICD-10-CM

## 2022-01-16 MED ORDER — AZITHROMYCIN 250 MG PO TABS: ORAL_TABLET | ORAL | 0 refills | Status: AC

## 2022-01-16 MED ORDER — GUAIFENESIN ER 600 MG PO TB12: 600.0000 mg | ORAL_TABLET | Freq: Two times a day (BID) | ORAL | 0 refills | Status: DC

## 2022-01-16 MED ORDER — PREDNISONE 10 MG (21) PO TBPK: ORAL_TABLET | ORAL | 0 refills | Status: DC

## 2022-01-16 NOTE — Patient Instructions (Signed)
  Acute Bronchitis, Adult  Acute bronchitis is when air tubes in the lungs (bronchi) suddenly get swollen. The condition can make it hard for you to breathe. In adults, acute bronchitis usually goes away within 2 weeks. A cough caused by bronchitis may last up to 3 weeks. Smoking, allergies, and asthma can make the condition worse. What are the causes? Germs that cause cold and flu (viruses). The most common cause of this condition is the virus that causes the common cold. Bacteria. Substances that bother (irritate) the lungs, including: Smoke from cigarettes and other types of tobacco. Dust and pollen. Fumes from chemicals, gases, or burned fuel. Indoor or outdoor air pollution. What increases the risk? A weak body's defense system. This is also called the immune system. Any condition that affects your lungs and breathing, such as asthma. What are the signs or symptoms? A cough. Coughing up clear, yellow, or green mucus. Making high-pitched whistling sounds when you breathe, most often when you breathe out (wheezing). Runny or stuffy nose. Having too much mucus in your lungs (chest congestion). Shortness of breath. Body aches. A sore throat. How is this treated? Acute bronchitis may go away over time without treatment. Your doctor may tell you to: Drink more fluids. This will help thin your mucus so it is easier to cough up. Use a device that gets medicine into your lungs (inhaler). Use a vaporizer or a humidifier. These are machines that add water to the air. This helps with coughing and poor breathing. Take a medicine that thins mucus and helps clear it from your lungs. Take a medicine that prevents or stops coughing. It is not common to take an antibiotic medicine for this condition. Follow these instructions at home:  Take over-the-counter and prescription medicines only as told by your doctor. Use an inhaler, vaporizer, or humidifier as told by your doctor. Take two  teaspoons (10 mL) of honey at bedtime. This helps lessen your coughing at night. Drink enough fluid to keep your pee (urine) pale yellow. Do not smoke or use any products that contain nicotine or tobacco. If you need help quitting, ask your doctor. Get a lot of rest. Return to your normal activities when your doctor says that it is safe. Keep all follow-up visits. How is this prevented?  Wash your hands often with soap and water for at least 20 seconds. If you cannot use soap and water, use hand sanitizer. Avoid contact with people who have cold symptoms. Try not to touch your mouth, nose, or eyes with your hands. Avoid breathing in smoke or chemical fumes. Make sure to get the flu shot every year. Contact a doctor if: Your symptoms do not get better in 2 weeks. You have trouble coughing up the mucus. Your cough keeps you awake at night. You have a fever. Get help right away if: You cough up blood. You have chest pain. You have very bad shortness of breath. You faint or keep feeling like you are going to faint. You have a very bad headache. Your fever or chills get worse. These symptoms may be an emergency. Get help right away. Call your local emergency services (911 in the [place] Do not wait to see if the symptoms will go away. Do not drive yourself to the hospital. Summary Acute bronchitis is when air tubes in the lungs (bronchi) suddenly get swollen. In adults, acute bronchitis usually goes away within 2 weeks. Drink more fluids. This will help thin your mucus so it   is easier to cough up. Take over-the-counter and prescription medicines only as told by your doctor. Contact a doctor if your symptoms do not improve after 2 weeks of treatment. This information is not intended to replace advice given to you by your health care provider. Make sure you discuss any questions you have with your health care provider. Document Revised: 06/15/2020 Document Reviewed: 06/15/2020 Elsevier  Patient Education  2023 [employer]  

## 2022-01-16 NOTE — Progress Notes (Signed)
Acute Office Visit  Subjective:     Patient ID: Madison Gentry, female    DOB: 06-04-1965, 56 y.o.   MRN: [id number]  Chief Complaint  Patient presents with   Cough   Nasal Congestion    Cough This is a new problem. The current episode started in the past 7 days. The problem has been unchanged. The cough is Productive of sputum. Associated symptoms include chills, headaches, nasal congestion, a sore throat and shortness of breath. Pertinent negatives include no ear congestion, ear pain or rash. The treatment provided no relief.    Review of Systems  Constitutional:  Positive for chills.  HENT:  Positive for congestion, sinus pain and sore throat. Negative for ear pain.   Respiratory:  Positive for cough and shortness of breath.   Cardiovascular: Negative.   Gastrointestinal: Negative.   Genitourinary: Negative.   Musculoskeletal: Negative.   Skin: Negative.  Negative for itching and rash.  Neurological:  Positive for headaches.  All other systems reviewed and are negative.       Objective:    BP 125/75   Pulse 98   Temp 98.8 F (37.1 C)   Ht 5\' 2"  (1.575 m)   Wt 189 lb (85.7 kg)   SpO2 96%   BMI 34.57 kg/m  BP Readings from Last 3 Encounters:  01/16/22 125/75  01/08/22 125/72  01/06/22 134/85   Wt Readings from Last 3 Encounters:  01/16/22 189 lb (85.7 kg)  01/08/22 194 lb 12.8 oz (88.4 kg)  11/17/21 195 lb (88.5 kg)      Physical Exam Vitals and nursing note reviewed.  Constitutional:      Appearance: Normal appearance. She is obese.  HENT:     Head: Normocephalic.     Right Ear: External ear normal.     Left Ear: External ear normal.     Nose: Congestion present.     Mouth/Throat:     Mouth: Mucous membranes are moist.     Pharynx: No oropharyngeal exudate.  Eyes:     Conjunctiva/sclera: Conjunctivae normal.  Cardiovascular:     Pulses: Normal pulses.     Heart sounds: Normal heart sounds.  Pulmonary:     Effort: Pulmonary effort is normal.      Breath sounds: Normal breath sounds.  Abdominal:     General: Bowel sounds are normal.  Skin:    General: Skin is warm.     Findings: No erythema or rash.  Neurological:     Mental Status: She is alert and oriented to person, place, and time.     No results found for any visits on 01/16/22.      Assessment & Plan:  Patient presents with symptoms of acute bronchitis, with persistent symptoms the past 4-5 days. Advised patient to Take meds as prescribed - Use a cool mist humidifier  -Use saline nose sprays frequently -Force fluids -For fever or aches or pains- take Tylenol or ibuprofen. -If symptoms do not improve, she may need to be COVID tested to rule this out Follow up with worsening unresolved symptoms  Problem List Items Addressed This Visit   None Visit Diagnoses     Acute bronchitis with COPD (HCC)    -  Primary   Relevant Medications   azithromycin (ZITHROMAX) 250 MG tablet   predniSONE (STERAPRED UNI-PAK 21 TAB) 10 MG (21) TBPK tablet   guaiFENesin (MUCINEX) 600 MG 12 hr tablet       Meds ordered this encounter  Medications  azithromycin (ZITHROMAX) 250 MG tablet    Sig: Take 2 tablets on day 1, then 1 tablet daily on days 2 through 5    Dispense:  6 tablet    Refill:  0    Order Specific Question:   Supervising Provider    Answer:   Raliegh Ip [[id number]]   predniSONE (STERAPRED UNI-PAK 21 TAB) 10 MG (21) TBPK tablet    Sig: 6 tablet day 1, 5 tablet day 2, 4 tablet day 3, 3 tablet day 4, 2 tablet 5, 1 tablet day 6    Dispense:  21 tablet    Refill:  0    Order Specific Question:   Supervising Provider    Answer:   Raliegh Ip [[id number]]   guaiFENesin (MUCINEX) 600 MG 12 hr tablet    Sig: Take 1 tablet (600 mg total) by mouth 2 (two) times daily.    Dispense:  30 tablet    Refill:  0    Order Specific Question:   Supervising Provider    Answer:   Raliegh Ip [[id number]]    No follow-ups on file.  Daryll Drown, NP

## 2022-01-23 ENCOUNTER — Other Ambulatory Visit (HOSPITAL_COMMUNITY): Payer: Self-pay

## 2022-01-23 MED ORDER — HYDROCODONE-ACETAMINOPHEN 10-325 MG PO TABS
1.0000 | ORAL_TABLET | Freq: Four times a day (QID) | ORAL | 0 refills | Status: DC | PRN
  Filled 2022-01-23 – 2022-01-27 (×2): qty 120, 30d supply, fill #0

## 2022-01-25 ENCOUNTER — Other Ambulatory Visit (HOSPITAL_COMMUNITY): Payer: Self-pay

## 2022-01-27 ENCOUNTER — Other Ambulatory Visit (HOSPITAL_COMMUNITY): Payer: Self-pay

## 2022-01-29 ENCOUNTER — Other Ambulatory Visit (HOSPITAL_COMMUNITY): Payer: Self-pay

## 2022-02-21 ENCOUNTER — Other Ambulatory Visit (HOSPITAL_COMMUNITY): Payer: Self-pay

## 2022-02-21 ENCOUNTER — Encounter: Payer: Self-pay | Admitting: Nurse Practitioner

## 2022-02-21 ENCOUNTER — Telehealth (INDEPENDENT_AMBULATORY_CARE_PROVIDER_SITE_OTHER): Payer: Medicaid Other | Admitting: Nurse Practitioner

## 2022-02-21 DIAGNOSIS — J440 Chronic obstructive pulmonary disease with acute lower respiratory infection: Secondary | ICD-10-CM | POA: Diagnosis not present

## 2022-02-21 DIAGNOSIS — J209 Acute bronchitis, unspecified: Secondary | ICD-10-CM

## 2022-02-21 MED ORDER — AZITHROMYCIN 250 MG PO TABS: ORAL_TABLET | ORAL | 0 refills | Status: DC

## 2022-02-21 MED ORDER — PREDNISONE 20 MG PO TABS: 40.0000 mg | ORAL_TABLET | Freq: Every day | ORAL | 0 refills | Status: AC

## 2022-02-21 MED ORDER — BENZONATATE 100 MG PO CAPS: 100.0000 mg | ORAL_CAPSULE | Freq: Three times a day (TID) | ORAL | 0 refills | Status: DC | PRN

## 2022-02-21 NOTE — Patient Instructions (Signed)

## 2022-02-21 NOTE — Progress Notes (Signed)
Virtual Visit  Note Due to COVID-19 pandemic this visit was conducted virtually. This visit type was conducted due to national recommendations for restrictions regarding the COVID-19 Pandemic (e.g. social distancing, sheltering in place) in an effort to limit this patient's exposure and mitigate transmission in our community. All issues noted in this document were discussed and addressed.  A physical exam was not performed with this format.  I connected with Madison Gentry on 02/21/22 at 10:50 by telephone and verified that I am speaking with the correct person using two identifiers. Madison Gentry is currently located at home and her husband is currently with her during visit. The provider, Madison Daphine Deutscher, FNP is located in their office at time of visit.  I discussed the limitations, risks, security and privacy concerns of performing an evaluation and management service by telephone and the availability of in person appointments. I also discussed with the patient that there may be a patient responsible charge related to this service. The patient expressed understanding and agreed to proceed.   History and Present Illness:  Patient is a smoker with history of COPD  Cough This is a new problem. Episode onset: Christmas day. The problem has been gradually worsening. The cough is Productive of sputum. Associated symptoms include chills, a fever, myalgias, nasal congestion and a sore throat (slight). Nothing aggravates the symptoms. Treatments tried: dayquil and alkelseltzer cold. The treatment provided mild relief. Her past medical history is significant for COPD.      Review of Systems  Constitutional:  Positive for chills and fever.  HENT:  Positive for sore throat (slight).   Respiratory:  Positive for cough.   Musculoskeletal:  Positive for myalgias.     Observations/Objective: Alert and oriented- answers all questions appropriately No distress Raspy voice Cough deep and  dry  Assessment and Plan: Madison Gentry in today with chief complaint of Cough   1. Acute bronchitis with COPD (HCC) 1. Take meds as prescribed 2. Use a cool mist humidifier especially during the winter months and when heat has been humid. 3. Use saline nose sprays frequently 4. Saline irrigations of the nose can be very helpful if Gentry frequently.  * 4X daily for 1 week*  * Use of a nettie pot can be helpful with this. Follow directions with this* 5. Drink plenty of fluids 6. Keep thermostat turn down low 7.For any cough or congestion- tessalon perles 8. For fever or aces or pains- take tylenol or ibuprofen appropriate for age and weight.  * for fevers greater than 101 orally you may alternate ibuprofen and tylenol every  3 hours.   Meds ordered this encounter  Medications   azithromycin (ZITHROMAX Z-PAK) 250 MG tablet    Sig: As directed    Dispense:  6 tablet    Refill:  0    Order Specific Question:   Supervising Provider    Answer:   Arville Care A [[id number]]   predniSONE (DELTASONE) 20 MG tablet    Sig: Take 2 tablets (40 mg total) by mouth daily with breakfast for 5 days. 2 po daily for 5 days    Dispense:  10 tablet    Refill:  0    Order Specific Question:   Supervising Provider    Answer:   Arville Care A [[id number]]   benzonatate (TESSALON PERLES) 100 MG capsule    Sig: Take 1 capsule (100 mg total) by mouth 3 (three) times daily as needed for cough.    Dispense:  20  capsule    Refill:  0    Order Specific Question:   Supervising Provider    Answer:   Arville Care A [[id number]]      Follow Up Instructions: prn    I discussed the assessment and treatment plan with the patient. The patient was provided an opportunity to ask questions and all were answered. The patient agreed with the plan and demonstrated an understanding of the instructions.   The patient was advised to call back or seek an in-person evaluation if the symptoms worsen or if the  condition fails to improve as anticipated.  The above assessment and management plan was discussed with the patient. The patient verbalized understanding of and has agreed to the management plan. Patient is aware to call the clinic if symptoms persist or worsen. Patient is aware when to return to the clinic for a follow-up visit. Patient educated on when it is appropriate to go to the emergency department.   Time call ended:  11:06  I provided 12 minutes of  non face-to-face time during this encounter.    Madison Daphine Deutscher, FNP

## 2022-02-22 ENCOUNTER — Other Ambulatory Visit (HOSPITAL_COMMUNITY): Payer: Self-pay

## 2022-02-22 MED ORDER — PREDNISONE 5 MG PO TABS
5.0000 mg | ORAL_TABLET | Freq: Two times a day (BID) | ORAL | 0 refills | Status: DC
  Filled 2022-02-22: qty 14, 7d supply, fill #0

## 2022-02-22 MED ORDER — HYDROCODONE-ACETAMINOPHEN 10-325 MG PO TABS
1.0000 | ORAL_TABLET | Freq: Four times a day (QID) | ORAL | 0 refills | Status: DC | PRN
  Filled 2022-03-01 – 2022-03-02 (×2): qty 120, 30d supply, fill #0

## 2022-02-24 ENCOUNTER — Other Ambulatory Visit (HOSPITAL_COMMUNITY): Payer: Self-pay

## 2022-02-28 ENCOUNTER — Telehealth: Payer: Self-pay | Admitting: Nurse Practitioner

## 2022-02-28 ENCOUNTER — Other Ambulatory Visit (HOSPITAL_COMMUNITY): Payer: Self-pay

## 2022-02-28 DIAGNOSIS — F5101 Primary insomnia: Secondary | ICD-10-CM

## 2022-02-28 NOTE — Progress Notes (Signed)
Attempted to reach patient regarding LDCT referral. Unable to reach patient directly. Detailed VM left asking that the patient return my call. Due to numerous failed attempted to reach the patient, referral closed at this time.

## 2022-02-28 NOTE — Telephone Encounter (Signed)
Nedds to be seen

## 2022-03-01 ENCOUNTER — Other Ambulatory Visit (HOSPITAL_COMMUNITY): Payer: Self-pay

## 2022-03-02 ENCOUNTER — Other Ambulatory Visit (HOSPITAL_COMMUNITY): Payer: Self-pay

## 2022-03-05 ENCOUNTER — Other Ambulatory Visit: Payer: Self-pay | Admitting: Nurse Practitioner

## 2022-03-05 DIAGNOSIS — F5101 Primary insomnia: Secondary | ICD-10-CM

## 2022-03-05 MED ORDER — ZOLPIDEM TARTRATE 10 MG PO TABS: 10.0000 mg | ORAL_TABLET | Freq: Every day | ORAL | 0 refills | Status: DC

## 2022-03-05 NOTE — Telephone Encounter (Signed)
Ambien sent to pharmacy 

## 2022-03-05 NOTE — Telephone Encounter (Signed)
Will refill ambien but not prednisone

## 2022-03-05 NOTE — Progress Notes (Signed)
Meds ordered this encounter  Medications   zolpidem (AMBIEN) 10 MG tablet    Sig: Take 1 tablet (10 mg total) by mouth at bedtime.    Dispense:  30 tablet    Refill:  0    Order Specific Question:   Supervising Provider    Answer:   Worthy Rancher [[id number]]   [place], FNP

## 2022-03-05 NOTE — Telephone Encounter (Signed)
Patient has an appt on 2/13 that was made by PCP at last appt, there are no openings with PCP at this time until 2/8 and she is completely out of medication.

## 2022-03-16 ENCOUNTER — Telehealth: Payer: Self-pay | Admitting: Nurse Practitioner

## 2022-03-16 ENCOUNTER — Other Ambulatory Visit: Payer: Self-pay | Admitting: Nurse Practitioner

## 2022-03-16 DIAGNOSIS — E1142 Type 2 diabetes mellitus with diabetic polyneuropathy: Secondary | ICD-10-CM

## 2022-03-16 MED ORDER — SEMAGLUTIDE (2 MG/DOSE) 8 MG/3ML ~~LOC~~ SOPN: 2.0000 mg | PEN_INJECTOR | SUBCUTANEOUS | 3 refills | Status: DC

## 2022-03-16 NOTE — Telephone Encounter (Signed)
Called and spoke with patient. She just wanted to know if Ozempic goes any higher than 2mg . Advised that 2mg  was the highest dose. Patient verbalized understanding

## 2022-03-16 NOTE — Telephone Encounter (Signed)
Patient would like to discuss dosage of Ozempic with nurse. Please call back

## 2022-03-22 ENCOUNTER — Other Ambulatory Visit (HOSPITAL_COMMUNITY): Payer: Self-pay

## 2022-03-22 MED ORDER — BACLOFEN 10 MG PO TABS
10.0000 mg | ORAL_TABLET | Freq: Three times a day (TID) | ORAL | 1 refills | Status: AC
  Filled 2022-03-22: qty 30, 10d supply, fill #0

## 2022-03-22 MED ORDER — HYDROCODONE-ACETAMINOPHEN 10-325 MG PO TABS
1.0000 | ORAL_TABLET | Freq: Four times a day (QID) | ORAL | 0 refills | Status: DC | PRN
  Filled 2022-03-22 – 2022-04-02 (×2): qty 120, 30d supply, fill #0

## 2022-03-22 MED ORDER — PREDNISONE 5 MG PO TABS
5.0000 mg | ORAL_TABLET | Freq: Two times a day (BID) | ORAL | 0 refills | Status: DC
  Filled 2022-03-22: qty 14, 7d supply, fill #0

## 2022-03-23 ENCOUNTER — Other Ambulatory Visit (HOSPITAL_COMMUNITY): Payer: Self-pay

## 2022-04-02 ENCOUNTER — Other Ambulatory Visit (HOSPITAL_COMMUNITY): Payer: Self-pay

## 2022-04-03 ENCOUNTER — Other Ambulatory Visit (HOSPITAL_COMMUNITY): Payer: Self-pay

## 2022-04-04 ENCOUNTER — Other Ambulatory Visit (HOSPITAL_COMMUNITY): Payer: Self-pay

## 2022-04-04 ENCOUNTER — Other Ambulatory Visit: Payer: Self-pay | Admitting: Nurse Practitioner

## 2022-04-04 DIAGNOSIS — F5101 Primary insomnia: Secondary | ICD-10-CM

## 2022-04-04 NOTE — Telephone Encounter (Signed)
Pt has appt scheduled with you 04/10/22-ok to fill or does pt have to wait till appt

## 2022-04-10 ENCOUNTER — Encounter: Payer: Self-pay | Admitting: Nurse Practitioner

## 2022-04-10 ENCOUNTER — Ambulatory Visit: Payer: Medicaid Other | Admitting: Nurse Practitioner

## 2022-04-10 VITALS — BP 105/65 | HR 90 | Temp 97.7°F | Resp 20 | Ht 62.0 in | Wt 195.0 lb

## 2022-04-10 DIAGNOSIS — J411 Mucopurulent chronic bronchitis: Secondary | ICD-10-CM | POA: Diagnosis not present

## 2022-04-10 DIAGNOSIS — E1142 Type 2 diabetes mellitus with diabetic polyneuropathy: Secondary | ICD-10-CM

## 2022-04-10 DIAGNOSIS — Z720 Tobacco use: Secondary | ICD-10-CM

## 2022-04-10 DIAGNOSIS — E7800 Pure hypercholesterolemia, unspecified: Secondary | ICD-10-CM

## 2022-04-10 DIAGNOSIS — F411 Generalized anxiety disorder: Secondary | ICD-10-CM

## 2022-04-10 DIAGNOSIS — F3341 Major depressive disorder, recurrent, in partial remission: Secondary | ICD-10-CM

## 2022-04-10 DIAGNOSIS — I25119 Atherosclerotic heart disease of native coronary artery with unspecified angina pectoris: Secondary | ICD-10-CM | POA: Diagnosis not present

## 2022-04-10 DIAGNOSIS — I10 Essential (primary) hypertension: Secondary | ICD-10-CM | POA: Diagnosis not present

## 2022-04-10 DIAGNOSIS — N393 Stress incontinence (female) (male): Secondary | ICD-10-CM

## 2022-04-10 DIAGNOSIS — K219 Gastro-esophageal reflux disease without esophagitis: Secondary | ICD-10-CM

## 2022-04-10 DIAGNOSIS — F5101 Primary insomnia: Secondary | ICD-10-CM

## 2022-04-10 LAB — BAYER DCA HB A1C WAIVED: HB A1C (BAYER DCA - WAIVED): 7.8 % — ABNORMAL HIGH (ref 4.8–5.6)

## 2022-04-10 MED ORDER — SEMAGLUTIDE (2 MG/DOSE) 8 MG/3ML ~~LOC~~ SOPN: 2.0000 mg | PEN_INJECTOR | SUBCUTANEOUS | 3 refills | Status: DC

## 2022-04-10 MED ORDER — ZOLPIDEM TARTRATE 10 MG PO TABS: 10.0000 mg | ORAL_TABLET | Freq: Every day | ORAL | 5 refills | Status: DC

## 2022-04-10 NOTE — Progress Notes (Signed)
Subjective:    Patient ID: Madison Gentry, female    DOB: July 28, 1965, 57 y.o.   MRN: [id number]   Chief Complaint: medical management of chronic issues     HPI:  Madison Gentry is a 57 y.o. who identifies as a female who was assigned female at birth.   Social history: Lives with: husband and children and grandchildren Work history: disability   Comes in today for follow up of the following chronic medical issues:  1. Primary hypertension No c/o chest pain, sob or headache. Does not check blood pressure. BP Readings from Last 3 Encounters:  01/16/22 125/75  01/08/22 125/72  01/06/22 134/85     2. Atherosclerosis of native coronary artery of native heart with angina pectoris [facility]) Has not seen cardiology  3. Pure hypercholesterolemia She does not watch her diet and she does no exercise. Lab Results  Component Value Date   CHOL 152 01/08/2022   HDL 57 01/08/2022   LDLCALC 53 01/08/2022   TRIG 270 (H) 01/08/2022   CHOLHDL 2.7 01/08/2022     4. Mucopurulent chronic bronchitis (HCC) Is om symbicort daily. Uses albuterol a couple of times a week  5. Gastroesophageal reflux disease without esophagitis Has  to take omeprazole daily to keep symptoms under control  6. Type 2 diabetes mellitus with diabetic polyneuropathy, without long-term current use of insulin (HCC) She does not check her blood sugars at home. Nor does she watch her diet Lab Results  Component Value Date   HGBA1C 7.8 (H) 01/08/2022     7. Recurrent major depressive disorder, in partial remission ([place]) Is on prozac daily and is doing well.    04/10/2022    1:51 PM 01/08/2022    4:12 PM 11/16/2021   10:51 AM  Depression screen PHQ 2/9  Decreased Interest 3 3 2  $ Down, Depressed, Hopeless 0 2 2  PHQ - 2 Score 3 5 4  $ Altered sleeping 0 2 3  Tired, decreased energy 1 3 1  $ Change in appetite 1 0 1  Feeling bad or failure about yourself  0 1 0  Trouble concentrating 0 2 0  Moving slowly or  fidgety/restless 0 0 0  Suicidal thoughts 0 0 0  PHQ-9 Score 5 13 9  $ Difficult doing work/chores Extremely dIfficult Extremely dIfficult Very difficult     8. GAD (generalized anxiety disorder)    04/10/2022    1:51 PM 01/08/2022    4:12 PM 11/16/2021   10:51 AM 10/05/2021   11:56 AM  GAD 7 : Generalized Anxiety Score  Nervous, Anxious, on Edge 1 2 1 2  $ Control/stop worrying 0 0 1 0  Worry too much - different things 1 1 1 $ 0  Trouble relaxing 1 1 1 2  $ Restless 0 0 0 0  Easily annoyed or irritable 1 0 1 2  Afraid - awful might happen 0 0 1 0  Total GAD 7 Score 4 4 6 6  $ Anxiety Difficulty Extremely difficult Very difficult Somewhat difficult Somewhat difficult      9. Primary insomnia Is on ambien to sleep. Cannot slepp without it.  10. Tobacco user Smokes over a pack a day  11. Stress incontinence of urine Has to wear depends and pads daily  12. Morbid obesity ([place]) Weight is up 6lbs Wt Readings from Last 3 Encounters:  04/10/22 195 lb (88.5 kg)  01/16/22 189 lb (85.7 kg)  01/08/22 194 lb 12.8 oz (88.4 kg)   BMI Readings from Last 3 Encounters:  04/10/22 35.67 kg/m  01/16/22 34.57 kg/m  01/08/22 35.63 kg/m     New complaints: None today  Allergies  Allergen Reactions   Butrans [Buprenorphine] Other (See Comments)    Skin irritation   Outpatient Encounter Medications as of 04/10/2022  Medication Sig   ACCU-CHEK GUIDE test strip USE 1 UP TO 4 TIMES DAILY AS DIRECTED   Accu-Chek Softclix Lancets lancets Use as directed up to 4 times daily as directed Dx E11.9   albuterol (VENTOLIN HFA) 108 (90 Base) MCG/ACT inhaler Inhale 2 puffs into the lungs every 6 (six) hours as needed for wheezing or shortness of breath.   amLODipine (NORVASC) 10 MG tablet Take 1 tablet (10 mg total) by mouth daily.   Aspirin-Acetaminophen-Caffeine 520-260-32.5 MG PACK Take 1 packet by mouth 2 (two) times daily as needed. For pain or headache   azithromycin (ZITHROMAX Z-PAK) 250 MG  tablet As directed   baclofen (LIORESAL) 10 MG tablet Take 10 mg by mouth 3 (three) times daily.   baclofen (LIORESAL) 10 MG tablet Take 1 tablet (10 mg total) by mouth 3 (three) times daily.   benzonatate (TESSALON PERLES) 100 MG capsule Take 1 capsule (100 mg total) by mouth 3 (three) times daily as needed for cough.   Blood Glucose Monitoring Suppl (ACCU-CHEK GUIDE ME) w/Device KIT USE AS DIRECTED UP TO 4 TIMES DAILY AS DIRECTED Dx E11.9   busPIRone (BUSPAR) 7.5 MG tablet Take 1 tablet (7.5 mg total) by mouth daily.   cyclobenzaprine (FLEXERIL) 10 MG tablet Take 1 tablet (10 mg total) by mouth 3 (three) times daily.   diclofenac Sodium (VOLTAREN) 1 % GEL Apply 2 g topically 4 (four) times daily as needed (pain).   ezetimibe (ZETIA) 10 MG tablet Take 1 tablet (10 mg total) by mouth daily. TAKE 1 TABLET BY MOUTH ONCE DAILY   FLUoxetine (PROZAC) 40 MG capsule Take 1 capsule (40 mg total) by mouth 2 (two) times daily.   guaiFENesin (MUCINEX) 600 MG 12 hr tablet Take 1 tablet (600 mg total) by mouth 2 (two) times daily.   hydrochlorothiazide (HYDRODIURIL) 25 MG tablet Take 1 tablet (25 mg total) by mouth daily.   HYDROcodone-acetaminophen (NORCO) 10-325 MG tablet Take 1 tablet by mouth 4 (four) times daily as needed.   ibuprofen (ADVIL,MOTRIN) 200 MG tablet Take 200 mg by mouth every 6 (six) hours as needed. Takes 800 mg when needed for menstrual cramping   Incontinence Supplies KIT Patient needs Pull ups, chuck pads and wipes.   lisinopril (ZESTRIL) 10 MG tablet Take 1 tablet (10 mg total) by mouth daily.   metFORMIN (GLUCOPHAGE) 500 MG tablet Take 1 tablet (500 mg total) by mouth 2 (two) times daily with a meal.   nitroGLYCERIN (NITROSTAT) 0.4 MG SL tablet DISSOLVE ONE TABLET UNDER THE TONGUE EVERY 5 MINUTES AS NEEDED FOR CHEST PAIN.  DO NOT EXCEED A TOTAL OF 3 DOSES IN 15 MINUTES (Patient taking differently: DISSOLVE ONE TABLET UNDER THE TONGUE EVERY 5 MINUTES AS NEEDED FOR CHEST PAIN.  DO NOT  EXCEED A TOTAL OF 3 DOSES IN 15 MINUTES)   omeprazole (PRILOSEC) 40 MG capsule Take 1 capsule (40 mg total) by mouth daily.   predniSONE (DELTASONE) 5 MG tablet Take 1 tablet (5 mg total) by mouth 2 (two) times daily.   predniSONE (STERAPRED UNI-PAK 21 TAB) 10 MG (21) TBPK tablet 6 tablet day 1, 5 tablet day 2, 4 tablet day 3, 3 tablet day 4, 2 tablet 5, 1 tablet day 6  Semaglutide, 2 MG/DOSE, 8 MG/3ML SOPN Inject 2 mg as directed once a week.   silver sulfADIAZINE (SILVADENE) 1 % cream Apply 1 application  topically 2 (two) times daily as needed (rash/skin irritation).   simvastatin (ZOCOR) 40 MG tablet TAKE 1 TABLET BY MOUTH ONCE DAILY AT 6 PM Strength: 40 mg   SYMBICORT 80-4.5 MCG/ACT inhaler Inhale 2 puffs into the lungs 2 (two) times daily.   zolpidem (AMBIEN) 10 MG tablet TAKE 1 TABLET BY MOUTH AT BEDTIME   No facility-administered encounter medications on file as of 04/10/2022.    Past Surgical History:  Procedure Laterality Date   TUBAL LIGATION      Family History  Problem Relation Age of Onset   CAD Mother 22   CAD Father 35       Died age 12   CAD Brother 85      Controlled substance contract: 04/10/22     Review of Systems  Constitutional:  Negative for diaphoresis.  Eyes:  Negative for pain.  Respiratory:  Positive for cough and wheezing. Negative for shortness of breath.   Cardiovascular:  Negative for chest pain, palpitations and leg swelling.  Gastrointestinal:  Negative for abdominal pain.  Endocrine: Negative for polydipsia.  Skin:  Negative for rash.  Neurological:  Negative for dizziness, weakness and headaches.  Hematological:  Does not bruise/bleed easily.  All other systems reviewed and are negative.      Objective:   Physical Exam Vitals and nursing note reviewed.  Constitutional:      General: She is not in acute distress.    Appearance: Normal appearance. She is well-developed.  HENT:     Head: Normocephalic.     Right Ear: Tympanic  membrane normal.     Left Ear: Tympanic membrane normal.     Nose: Nose normal.     Mouth/Throat:     Mouth: Mucous membranes are moist.  Eyes:     Pupils: Pupils are equal, round, and reactive to light.  Neck:     Vascular: No carotid bruit or JVD.  Cardiovascular:     Rate and Rhythm: Normal rate and regular rhythm.     Heart sounds: Normal heart sounds.  Pulmonary:     Effort: Pulmonary effort is normal. No respiratory distress.     Breath sounds: Normal breath sounds. No wheezing or rales.  Chest:     Chest wall: No tenderness.  Abdominal:     General: Bowel sounds are normal. There is no distension or abdominal bruit.     Palpations: Abdomen is soft. There is no hepatomegaly, splenomegaly, mass or pulsatile mass.     Tenderness: There is no abdominal tenderness.  Musculoskeletal:        General: Normal range of motion.     Cervical back: Normal range of motion and neck supple.  Lymphadenopathy:     Cervical: No cervical adenopathy.  Skin:    General: Skin is warm and dry.  Neurological:     Mental Status: She is alert and oriented to person, place, and time.     Deep Tendon Reflexes: Reflexes are normal and symmetric.  Psychiatric:        Behavior: Behavior normal.        Thought Content: Thought content normal.        Judgment: Judgment normal.     BP 105/65   Pulse 90   Temp 97.7 F (36.5 C) (Temporal)   Resp 20   Ht 5' 2"$  (1.575  m)   Wt 195 lb (88.5 kg)   SpO2 94%   BMI 35.67 kg/m        Assessment & Plan:  Madison Gentry comes in today with chief complaint of Medical Management of Chronic Issues   Diagnosis and orders addressed:  1. Primary hypertension Low sodium diet - CBC with Differential/Platelet - CMP14+EGFR  2. Atherosclerosis of native coronary artery of native heart with angina pectoris ([place])  3. Pure hypercholesterolemia Low fat diet - Lipid panel  4. Mucopurulent chronic bronchitis ([place]) Needs to stop smoking  5. Gastroesophageal  reflux disease without esophagitis Avoid spicy foods Do not eat 2 hours prior to bedtime  6. Type 2 diabetes mellitus with diabetic polyneuropathy, without long-term current use of insulin (HCC) Stricter carb counting - Bayer DCA Hb A1c Waived - Semaglutide, 2 MG/DOSE, 8 MG/3ML SOPN; Inject 2 mg as directed once a week.  Dispense: 3 mL; Refill: 3  7. Recurrent major depressive disorder, in partial remission ([place]) Stress management  8. GAD (generalized anxiety disorder) - ToxASSURE Select 13 (MW), Urine  9. Primary insomnia Bedtime routine - zolpidem (AMBIEN) 10 MG tablet; Take 1 tablet (10 mg total) by mouth at bedtime.  Dispense: 30 tablet; Refill: 5  10. Tobacco user Smoking cessation encouraged  11. Stress incontinence of urine 12. Morbid obesity ([place]) Discussed diet and exercise for person with BMI >25 Will recheck weight in 3-6 months  Patient wanted m ore steroids.she has been taking tomuch. I told her she would have to speak with pain management for this. Discussed side effects of long term prednisone   Labs pending Health Maintenance reviewed Diet and exercise encouraged  Follow up plan: 3 months   [place], FNP

## 2022-04-10 NOTE — Patient Instructions (Signed)

## 2022-04-11 LAB — CMP14+EGFR
ALT: 13 IU/L (ref 0–32)
AST: 16 IU/L (ref 0–40)
Albumin/Globulin Ratio: 2 (ref 1.2–2.2)
Albumin: 4.1 g/dL (ref 3.8–4.9)
Alkaline Phosphatase: 67 IU/L (ref 44–121)
BUN/Creatinine Ratio: 19 (ref 9–23)
BUN: 9 mg/dL (ref 6–24)
Bilirubin Total: <0.2 mg/dL (ref 0.0–1.2)
CO2: 25 mmol/L (ref 20–29)
Calcium: 9.5 mg/dL (ref 8.7–10.2)
Chloride: 100 mmol/L (ref 96–106)
Creatinine, Ser: 0.47 mg/dL — ABNORMAL LOW (ref 0.57–1.00)
Globulin, Total: 2.1 g/dL (ref 1.5–4.5)
Glucose: 117 mg/dL — ABNORMAL HIGH (ref 70–99)
Potassium: 4.5 mmol/L (ref 3.5–5.2)
Sodium: 139 mmol/L (ref 134–144)
Total Protein: 6.2 g/dL (ref 6.0–8.5)
eGFR: 113 mL/min/{1.73_m2} (ref >59)

## 2022-04-11 LAB — CBC WITH DIFFERENTIAL/PLATELET
Basophils Absolute: 0.1 10*3/uL (ref 0.0–0.2)
Basos: 1 %
EOS (ABSOLUTE): 0.2 10*3/uL (ref 0.0–0.4)
Eos: 1 %
Hematocrit: 38.7 % (ref 34.0–46.6)
Hemoglobin: 12.3 g/dL (ref 11.1–15.9)
Immature Grans (Abs): 0 10*3/uL (ref 0.0–0.1)
Immature Granulocytes: 0 %
Lymphocytes Absolute: 3.3 10*3/uL — ABNORMAL HIGH (ref 0.7–3.1)
Lymphs: 21 %
MCH: 27.9 pg (ref 26.6–33.0)
MCHC: 31.8 g/dL (ref 31.5–35.7)
MCV: 88 fL (ref 79–97)
Monocytes Absolute: 0.7 10*3/uL (ref 0.1–0.9)
Monocytes: 4 %
Neutrophils Absolute: 11.3 10*3/uL — ABNORMAL HIGH (ref 1.4–7.0)
Neutrophils: 73 %
Platelets: 441 10*3/uL (ref 150–450)
RBC: 4.41 x10E6/uL (ref 3.77–5.28)
RDW: 13.1 % (ref 11.7–15.4)
WBC: 15.7 10*3/uL — ABNORMAL HIGH (ref 3.4–10.8)

## 2022-04-11 LAB — LIPID PANEL
Chol/HDL Ratio: 2.4 ratio (ref 0.0–4.4)
Cholesterol, Total: 124 mg/dL (ref 100–199)
HDL: 51 mg/dL (ref >39)
LDL Chol Calc (NIH): 55 mg/dL (ref 0–99)
Triglycerides: 94 mg/dL (ref 0–149)
VLDL Cholesterol Cal: 18 mg/dL (ref 5–40)

## 2022-04-13 LAB — TOXASSURE SELECT 13 (MW), URINE

## 2022-04-19 ENCOUNTER — Other Ambulatory Visit (HOSPITAL_COMMUNITY): Payer: Self-pay

## 2022-04-19 MED ORDER — HYDROCODONE-ACETAMINOPHEN 10-325 MG PO TABS
1.0000 | ORAL_TABLET | Freq: Four times a day (QID) | ORAL | 0 refills | Status: DC | PRN
  Filled 2022-04-19 – 2022-05-03 (×2): qty 120, 30d supply, fill #0

## 2022-04-24 ENCOUNTER — Encounter: Payer: Self-pay | Admitting: Nurse Practitioner

## 2022-04-26 ENCOUNTER — Other Ambulatory Visit (HOSPITAL_COMMUNITY): Payer: Self-pay

## 2022-04-30 ENCOUNTER — Other Ambulatory Visit (HOSPITAL_COMMUNITY): Payer: Self-pay

## 2022-05-01 ENCOUNTER — Other Ambulatory Visit (HOSPITAL_COMMUNITY): Payer: Self-pay

## 2022-05-03 ENCOUNTER — Other Ambulatory Visit (HOSPITAL_COMMUNITY): Payer: Self-pay

## 2022-05-03 ENCOUNTER — Other Ambulatory Visit: Payer: Self-pay

## 2022-05-04 ENCOUNTER — Other Ambulatory Visit (HOSPITAL_COMMUNITY): Payer: Self-pay

## 2022-05-05 ENCOUNTER — Other Ambulatory Visit (HOSPITAL_COMMUNITY): Payer: Self-pay

## 2022-05-07 ENCOUNTER — Other Ambulatory Visit (HOSPITAL_COMMUNITY): Payer: Self-pay

## 2022-05-09 ENCOUNTER — Other Ambulatory Visit (HOSPITAL_COMMUNITY): Payer: Self-pay

## 2022-05-15 ENCOUNTER — Telehealth: Payer: Self-pay | Admitting: Family Medicine

## 2022-05-15 ENCOUNTER — Other Ambulatory Visit (HOSPITAL_COMMUNITY): Payer: Self-pay

## 2022-05-15 NOTE — Telephone Encounter (Signed)
Your information has been sent to [facility]. 

## 2022-05-15 NOTE — Telephone Encounter (Signed)
Patient Advocate Encounter  Prior Authorization for Ozempic has been approved.

## 2022-07-09 ENCOUNTER — Encounter: Payer: Self-pay | Admitting: Nurse Practitioner

## 2022-07-09 ENCOUNTER — Ambulatory Visit: Payer: Medicaid Other | Admitting: Nurse Practitioner

## 2022-07-12 ENCOUNTER — Ambulatory Visit: Payer: Medicaid Other | Admitting: Nurse Practitioner

## 2022-07-12 NOTE — Progress Notes (Deleted)
Subjective:    Patient ID: Madison Gentry, female    DOB: November 04, 1965, 57 y.o.   MRN: [id number]   Chief Complaint: medical management of chronic issues     HPI:  Madison Gentry is a 57 y.o. who identifies as a female who was assigned female at birth.   Social history: Lives with: husband children and grand kids Work history: [employer]   Comes in today for follow up of the following chronic medical issues:  1. Primary hypertension No c/o chest pain, sob or headache. Does not check blood pressure at home. BP Readings from Last 3 Encounters:  04/10/22 105/65  01/16/22 125/75  01/08/22 125/72     2. Atherosclerosis of native coronary artery of native heart with angina pectoris [facility]) Patient has not seen cardiology.  3. Hyperlipidemia associated with type 2 diabetes mellitus (HCC) Does not watch diet and does no exercise at all Lab Results  Component Value Date   CHOL 124 04/10/2022   HDL 51 04/10/2022   LDLCALC 55 04/10/2022   TRIG 94 04/10/2022   CHOLHDL 2.4 04/10/2022     4. Type 2 diabetes mellitus with diabetic polyneuropathy, without long-term current use of insulin (HCC) Fasting blood sugars are up and down. She does not check them daiy. Lab Results  Component Value Date   HGBA1C 7.8 (H) 04/10/2022     5. Gastroesophageal reflux disease without esophagitis Is on omeprazole daily and that seems to work well to keep symptoms under control.  6. Mucopurulent chronic bronchitis (HCC) Is on symbicort maintneance inhaler. Says she does not use albuterol very often.  7. Tobacco user Still smoking over a pack a day.  8. Stress incontinence of urine Uses depends daily.   9. Recurrent major depressive disorder, in partial remission (HCC) Is on prozac abnd has been for several years. Says she is doing ok. ***  10. GAD (generalized anxiety disorder) On buspar BID ***  11. Chronic midline low back pain without sciatica Pain assessment: Cause of pain- *** Pain  location- *** Pain on scale of 1-10- *** Frequency- *** What increases pain-*** What makes pain Better-*** Effects on ADL - *** Any change in general medical condition-***  Current opioids rx- *** # meds rx- *** Effectiveness of current meds-*** Adverse reactions from pain meds-*** Morphine equivalent- ***  Pill count performed-{yes/ no:20286} Last drug screen - *** ( high risk q17m, moderate risk q18m, low risk yearly ) Urine drug screen today- {yes/no:20286} Was the NCCSR reviewed- ***  If yes were their any concerning findings? - ***   Overdose risk: ***     No data to display           Pain contract signed on:   12. Morbid obesity (HCC) No recent weight changes   New complaints: ***  Allergies  Allergen Reactions   Butrans [Buprenorphine] Other (See Comments)    Skin irritation   Outpatient Encounter Medications as of 07/12/2022  Medication Sig   ACCU-CHEK GUIDE test strip USE 1 UP TO 4 TIMES DAILY AS DIRECTED   Accu-Chek Softclix Lancets lancets Use as directed up to 4 times daily as directed Dx E11.9   albuterol (VENTOLIN HFA) 108 (90 Base) MCG/ACT inhaler Inhale 2 puffs into the lungs every 6 (six) hours as needed for wheezing or shortness of breath.   amLODipine (NORVASC) 10 MG tablet Take 1 tablet (10 mg total) by mouth daily.   Aspirin-Acetaminophen-Caffeine 520-260-32.5 MG PACK Take 1 packet by mouth 2 (two) times daily  as needed. For pain or headache   baclofen (LIORESAL) 10 MG tablet Take 10 mg by mouth 3 (three) times daily.   baclofen (LIORESAL) 10 MG tablet Take 1 tablet (10 mg total) by mouth 3 (three) times daily.   Blood Glucose Monitoring Suppl (ACCU-CHEK GUIDE ME) w/Device KIT USE AS DIRECTED UP TO 4 TIMES DAILY AS DIRECTED Dx E11.9   busPIRone (BUSPAR) 7.5 MG tablet Take 1 tablet (7.5 mg total) by mouth daily.   cyclobenzaprine (FLEXERIL) 10 MG tablet Take 1 tablet (10 mg total) by mouth 3 (three) times daily.   diclofenac Sodium (VOLTAREN)  1 % GEL Apply 2 g topically 4 (four) times daily as needed (pain).   ezetimibe (ZETIA) 10 MG tablet Take 1 tablet (10 mg total) by mouth daily. TAKE 1 TABLET BY MOUTH ONCE DAILY   FLUoxetine (PROZAC) 40 MG capsule Take 1 capsule (40 mg total) by mouth 2 (two) times daily.   hydrochlorothiazide (HYDRODIURIL) 25 MG tablet Take 1 tablet (25 mg total) by mouth daily.   ibuprofen (ADVIL,MOTRIN) 200 MG tablet Take 200 mg by mouth every 6 (six) hours as needed. Takes 800 mg when needed for menstrual cramping   Incontinence Supplies KIT Patient needs Pull ups, chuck pads and wipes.   lisinopril (ZESTRIL) 10 MG tablet Take 1 tablet (10 mg total) by mouth daily.   metFORMIN (GLUCOPHAGE) 500 MG tablet Take 1 tablet (500 mg total) by mouth 2 (two) times daily with a meal.   nitroGLYCERIN (NITROSTAT) 0.4 MG SL tablet DISSOLVE ONE TABLET UNDER THE TONGUE EVERY 5 MINUTES AS NEEDED FOR CHEST PAIN.  DO NOT EXCEED A TOTAL OF 3 DOSES IN 15 MINUTES (Patient taking differently: DISSOLVE ONE TABLET UNDER THE TONGUE EVERY 5 MINUTES AS NEEDED FOR CHEST PAIN.  DO NOT EXCEED A TOTAL OF 3 DOSES IN 15 MINUTES)   omeprazole (PRILOSEC) 40 MG capsule Take 1 capsule (40 mg total) by mouth daily.   Semaglutide, 2 MG/DOSE, 8 MG/3ML SOPN Inject 2 mg as directed once a week.   silver sulfADIAZINE (SILVADENE) 1 % cream Apply 1 application  topically 2 (two) times daily as needed (rash/skin irritation).   simvastatin (ZOCOR) 40 MG tablet TAKE 1 TABLET BY MOUTH ONCE DAILY AT 6 PM Strength: 40 mg   SYMBICORT 80-4.5 MCG/ACT inhaler Inhale 2 puffs into the lungs 2 (two) times daily.   zolpidem (AMBIEN) 10 MG tablet Take 1 tablet (10 mg total) by mouth at bedtime.   No facility-administered encounter medications on file as of 07/12/2022.    Past Surgical History:  Procedure Laterality Date   TUBAL LIGATION      Family History  Problem Relation Age of Onset   CAD Mother 61   CAD Father 3       Died age 3   CAD Brother 67       Controlled substance contract: ***;I     Review of Systems     Objective:   Physical Exam        Assessment & Plan:

## 2022-08-06 ENCOUNTER — Other Ambulatory Visit: Payer: Self-pay | Admitting: Nurse Practitioner

## 2022-08-06 DIAGNOSIS — E1142 Type 2 diabetes mellitus with diabetic polyneuropathy: Secondary | ICD-10-CM

## 2022-08-07 ENCOUNTER — Other Ambulatory Visit: Payer: Self-pay | Admitting: Nurse Practitioner

## 2022-08-07 DIAGNOSIS — Z122 Encounter for screening for malignant neoplasm of respiratory organs: Secondary | ICD-10-CM

## 2022-08-14 ENCOUNTER — Other Ambulatory Visit: Payer: Self-pay | Admitting: Nurse Practitioner

## 2022-08-14 DIAGNOSIS — M810 Age-related osteoporosis without current pathological fracture: Secondary | ICD-10-CM

## 2022-08-16 ENCOUNTER — Ambulatory Visit: Payer: Medicaid Other | Admitting: Nurse Practitioner

## 2022-08-28 ENCOUNTER — Other Ambulatory Visit: Payer: Self-pay | Admitting: Nurse Practitioner

## 2022-08-28 DIAGNOSIS — E1142 Type 2 diabetes mellitus with diabetic polyneuropathy: Secondary | ICD-10-CM

## 2022-09-03 ENCOUNTER — Encounter: Payer: Self-pay | Admitting: Nurse Practitioner

## 2022-09-03 ENCOUNTER — Ambulatory Visit: Payer: Medicaid Other | Admitting: Nurse Practitioner

## 2022-09-03 NOTE — Progress Notes (Deleted)
Subjective:    Patient ID: Madison Gentry, female    DOB: 1966-02-11, 57 y.o.   MRN: [id number]   Chief Complaint: medical management of chronic issues     HPI:  Madison Gentry is a 57 y.o. who identifies as a female who was assigned female at birth.   Social history: Lives with: husband Work history: disability   Comes in today for follow up of the following chronic medical issues:  1. Primary hypertension No c/o chest pain, sob or headache. Does not check blood pressure at home. BP Readings from Last 3 Encounters:  04/10/22 105/65  01/16/22 125/75  01/08/22 125/72     2. Hyperlipidemia associated with type 2 diabetes mellitus (HCC) Doe snot wathc diet and does no exercise at all. Lab Results  Component Value Date   CHOL 124 04/10/2022   HDL 51 04/10/2022   LDLCALC 55 04/10/2022   TRIG 94 04/10/2022   CHOLHDL 2.4 04/10/2022   The ASCVD Risk score (Arnett DK, et al., 2019) failed to calculate for the following reasons:   The patient has a prior MI or stroke diagnosis   3. Long-term current use of injectable noninsulin antidiabetic medication Is on ozempic weekly  4. Diabetes mellitus treated with oral medication (HCC) She does not check her blood sugars daily.  Lab Results  Component Value Date   HGBA1C 7.8 (H) 04/10/2022     5. Gastroesophageal reflux disease without esophagitis Just uses OTC meds as needed. Needs several times a week.  6. Recurrent major depressive disorder, in partial remission (HCC) Has been on prozac for several years. Doing well. ***  7. GAD (generalized anxiety disorder) Takes buspar which she says helps some. Can't do xanax due to pain medication. ***  8. Primary insomnia Takes ambien to sleep and cannot sleep at al without meds.  9. Stress incontinence of urine Uses incontinence supplies daily. Has accidents several times a day  10. Tobacco user Smokes over a pack a day. Low dose CT scan has been ordered but not done  yet.  11. Chronic midline low back pain without sciatica Has been getting pain meds form pain management since 03/2022.  12. Morbid obesity (HCC) No recent weight changes ***   New complaints: ***  Allergies  Allergen Reactions   Butrans [Buprenorphine] Other (See Comments)    Skin irritation   Outpatient Encounter Medications as of 09/03/2022  Medication Sig   ACCU-CHEK GUIDE test strip USE 1 UP TO 4 TIMES DAILY AS DIRECTED   Accu-Chek Softclix Lancets lancets Use as directed up to 4 times daily as directed Dx E11.9   albuterol (VENTOLIN HFA) 108 (90 Base) MCG/ACT inhaler Inhale 2 puffs into the lungs every 6 (six) hours as needed for wheezing or shortness of breath.   amLODipine (NORVASC) 10 MG tablet Take 1 tablet (10 mg total) by mouth daily.   Aspirin-Acetaminophen-Caffeine 520-260-32.5 MG PACK Take 1 packet by mouth 2 (two) times daily as needed. For pain or headache   baclofen (LIORESAL) 10 MG tablet Take 10 mg by mouth 3 (three) times daily.   baclofen (LIORESAL) 10 MG tablet Take 1 tablet (10 mg total) by mouth 3 (three) times daily.   Blood Glucose Monitoring Suppl (ACCU-CHEK GUIDE ME) w/Device KIT USE AS DIRECTED UP TO 4 TIMES DAILY AS DIRECTED Dx E11.9   busPIRone (BUSPAR) 7.5 MG tablet Take 1 tablet (7.5 mg total) by mouth daily.   cyclobenzaprine (FLEXERIL) 10 MG tablet Take 1 tablet (10 mg total)  by mouth 3 (three) times daily.   diclofenac Sodium (VOLTAREN) 1 % GEL Apply 2 g topically 4 (four) times daily as needed (pain).   ezetimibe (ZETIA) 10 MG tablet Take 1 tablet (10 mg total) by mouth daily. TAKE 1 TABLET BY MOUTH ONCE DAILY   FLUoxetine (PROZAC) 40 MG capsule Take 1 capsule (40 mg total) by mouth 2 (two) times daily.   hydrochlorothiazide (HYDRODIURIL) 25 MG tablet Take 1 tablet (25 mg total) by mouth daily.   ibuprofen (ADVIL,MOTRIN) 200 MG tablet Take 200 mg by mouth every 6 (six) hours as needed. Takes 800 mg when needed for menstrual cramping   Incontinence  Supplies KIT Patient needs Pull ups, chuck pads and wipes.   lisinopril (ZESTRIL) 10 MG tablet Take 1 tablet (10 mg total) by mouth daily.   metFORMIN (GLUCOPHAGE) 500 MG tablet Take 1 tablet (500 mg total) by mouth 2 (two) times daily with a meal.   nitroGLYCERIN (NITROSTAT) 0.4 MG SL tablet DISSOLVE ONE TABLET UNDER THE TONGUE EVERY 5 MINUTES AS NEEDED FOR CHEST PAIN.  DO NOT EXCEED A TOTAL OF 3 DOSES IN 15 MINUTES (Patient taking differently: DISSOLVE ONE TABLET UNDER THE TONGUE EVERY 5 MINUTES AS NEEDED FOR CHEST PAIN.  DO NOT EXCEED A TOTAL OF 3 DOSES IN 15 MINUTES)   omeprazole (PRILOSEC) 40 MG capsule Take 1 capsule (40 mg total) by mouth daily.   Semaglutide, 2 MG/DOSE, (OZEMPIC, 2 MG/DOSE,) 8 MG/3ML SOPN INJECT 2MG  INTO THE SKIN ONCE WEEKLY AS DIRECTED.   silver sulfADIAZINE (SILVADENE) 1 % cream Apply 1 application  topically 2 (two) times daily as needed (rash/skin irritation).   simvastatin (ZOCOR) 40 MG tablet TAKE 1 TABLET BY MOUTH ONCE DAILY AT 6 PM Strength: 40 mg   SYMBICORT 80-4.5 MCG/ACT inhaler Inhale 2 puffs into the lungs 2 (two) times daily.   zolpidem (AMBIEN) 10 MG tablet Take 1 tablet (10 mg total) by mouth at bedtime.   No facility-administered encounter medications on file as of 09/03/2022.    Past Surgical History:  Procedure Laterality Date   TUBAL LIGATION      Family History  Problem Relation Age of Onset   CAD Mother 66   CAD Father 84       Died age 73   CAD Brother 46      Controlled substance contract: ***     Review of Systems     Objective:   Physical Exam        Assessment & Plan:

## 2022-09-14 ENCOUNTER — Ambulatory Visit: Payer: Medicaid Other | Admitting: Nurse Practitioner

## 2022-09-18 ENCOUNTER — Other Ambulatory Visit: Payer: Self-pay | Admitting: Nurse Practitioner

## 2022-09-18 DIAGNOSIS — E1142 Type 2 diabetes mellitus with diabetic polyneuropathy: Secondary | ICD-10-CM

## 2022-09-26 ENCOUNTER — Other Ambulatory Visit: Payer: Self-pay | Admitting: Nurse Practitioner

## 2022-09-26 DIAGNOSIS — E7800 Pure hypercholesterolemia, unspecified: Secondary | ICD-10-CM

## 2022-09-26 DIAGNOSIS — I10 Essential (primary) hypertension: Secondary | ICD-10-CM

## 2022-10-05 ENCOUNTER — Other Ambulatory Visit: Payer: Self-pay | Admitting: Nurse Practitioner

## 2022-10-05 DIAGNOSIS — K219 Gastro-esophageal reflux disease without esophagitis: Secondary | ICD-10-CM

## 2022-10-09 ENCOUNTER — Other Ambulatory Visit: Payer: Self-pay | Admitting: Nurse Practitioner

## 2022-10-09 DIAGNOSIS — F5101 Primary insomnia: Secondary | ICD-10-CM

## 2022-10-11 ENCOUNTER — Encounter: Payer: Self-pay | Admitting: Family Medicine

## 2022-10-11 ENCOUNTER — Telehealth: Payer: Self-pay | Admitting: Nurse Practitioner

## 2022-10-11 ENCOUNTER — Telehealth (INDEPENDENT_AMBULATORY_CARE_PROVIDER_SITE_OTHER): Payer: Medicaid Other | Admitting: Family Medicine

## 2022-10-11 DIAGNOSIS — R220 Localized swelling, mass and lump, head: Secondary | ICD-10-CM | POA: Diagnosis not present

## 2022-10-11 DIAGNOSIS — N898 Other specified noninflammatory disorders of vagina: Secondary | ICD-10-CM | POA: Diagnosis not present

## 2022-10-11 DIAGNOSIS — K047 Periapical abscess without sinus: Secondary | ICD-10-CM

## 2022-10-11 MED ORDER — FLUCONAZOLE 150 MG PO TABS
150.0000 mg | ORAL_TABLET | Freq: Once | ORAL | 1 refills | Status: AC
Start: 2022-10-11 — End: 2022-10-11

## 2022-10-11 MED ORDER — AMOXICILLIN-POT CLAVULANATE 875-125 MG PO TABS: 1.0000 | ORAL_TABLET | Freq: Two times a day (BID) | ORAL | 0 refills | Status: AC

## 2022-10-11 NOTE — Progress Notes (Signed)
Virtual Visit via Video   I connected with patient on 10/11/22 at 1215 by a video enabled telemedicine application and verified that I am speaking with the correct person using two identifiers.  Location patient: Home Location provider: [facility] Persons participating in the virtual visit: Patient and Provider  I discussed the limitations of evaluation and management by telemedicine and the availability of in person appointments. The patient expressed understanding and agreed to proceed.  Subjective:   HPI:  Pt presents today for  Chief Complaint  Patient presents with   Vaginitis   Pt reports she has an abscessed tooth and had some left over PCN that she started taking. After taking the antibiotics she developed vaginal itching and white discharge. She still has the facial swelling and dental abscess.   Review of Systems  HENT:         Facial swelling, gum erythema, dental caries  Genitourinary:        Vaginal itching and discharge  All other systems reviewed and are negative.    Patient Active Problem List   Diagnosis Date Noted   Primary insomnia 02/01/2017   Gastroesophageal reflux disease without esophagitis 02/01/2017   GAD (generalized anxiety disorder) 02/01/2017   Narcotic abuse (HCC) 01/13/2015   COPD (chronic obstructive pulmonary disease) (HCC) 06/15/2013   Back pain 06/15/2013   Urinary incontinence 06/15/2013   Hypertension 11/07/2012   Tobacco user 09/22/2012   Diabetes mellitus treated with oral medication (HCC) 09/22/2012   Hyperlipidemia associated with type 2 diabetes mellitus (HCC) 09/22/2012   Morbid obesity (HCC) 02/11/2011   Depression 02/11/2011   Coronary atherosclerosis 01/18/2009    Social History   Tobacco Use   Smoking status: Every Day    Current packs/day: 1.50    Average packs/day: 1.5 packs/day for 28.0 years (42.0 ttl pk-yrs)    Types: Cigarettes   Smokeless tobacco: Never  Substance Use  Topics   Alcohol use: Yes    Comment: occas    Current Outpatient Medications:    amoxicillin-clavulanate (AUGMENTIN) 875-125 MG tablet, Take 1 tablet by mouth 2 (two) times daily for 10 days., Disp: 20 tablet, Rfl: 0   fluconazole (DIFLUCAN) 150 MG tablet, Take 1 tablet (150 mg total) by mouth once for 1 dose., Disp: 1 tablet, Rfl: 1   ACCU-CHEK GUIDE test strip, USE 1 UP TO 4 TIMES DAILY AS DIRECTED, Disp: , Rfl:    Accu-Chek Softclix Lancets lancets, Use as directed up to 4 times daily as directed Dx E11.9, Disp: 400 each, Rfl: 3   albuterol (VENTOLIN HFA) 108 (90 Base) MCG/ACT inhaler, Inhale 2 puffs into the lungs every 6 (six) hours as needed for wheezing or shortness of breath., Disp: 8 g, Rfl: 2   amLODipine (NORVASC) 10 MG tablet, TAKE ONE TABLET BY MOUTH ONCE DAILY., Disp: 90 tablet, Rfl: 0   Aspirin-Acetaminophen-Caffeine 520-260-32.5 MG PACK, Take 1 packet by mouth 2 (two) times daily as needed. For pain or headache, Disp: , Rfl:    baclofen (LIORESAL) 10 MG tablet, Take 1 tablet (10 mg total) by mouth 3 (three) times daily., Disp: 30 tablet, Rfl: 1   Blood Glucose Monitoring Suppl (ACCU-CHEK GUIDE ME) w/Device KIT, USE AS DIRECTED UP TO 4 TIMES DAILY AS DIRECTED Dx E11.9, Disp: 1 kit, Rfl: 0   busPIRone (BUSPAR) 7.5 MG tablet, Take 1 tablet (7.5 mg total) by mouth daily., Disp: 90 tablet, Rfl: 2   cyclobenzaprine (FLEXERIL) 10 MG tablet, Take 1 tablet (10  mg total) by mouth 3 (three) times daily., Disp: 20 tablet, Rfl: 0   diclofenac Sodium (VOLTAREN) 1 % GEL, Apply 2 g topically 4 (four) times daily as needed (pain)., Disp: , Rfl:    ezetimibe (ZETIA) 10 MG tablet, Take 1 tablet (10 mg total) by mouth daily. TAKE 1 TABLET BY MOUTH ONCE DAILY, Disp: 90 tablet, Rfl: 1   FLUoxetine (PROZAC) 40 MG capsule, Take 1 capsule (40 mg total) by mouth 2 (two) times daily., Disp: 180 capsule, Rfl: 1   hydrochlorothiazide (HYDRODIURIL) 25 MG tablet, Take 1 tablet (25 mg total) by mouth daily.,  Disp: 90 tablet, Rfl: 1   ibuprofen (ADVIL,MOTRIN) 200 MG tablet, Take 200 mg by mouth every 6 (six) hours as needed. Takes 800 mg when needed for menstrual cramping, Disp: , Rfl:    Incontinence Supplies KIT, Patient needs Pull ups, chuck pads and wipes., Disp: 1 kit, Rfl: 0   lisinopril (ZESTRIL) 10 MG tablet, TAKE ONE TABLET BY MOUTH ONCE DAILY., Disp: 90 tablet, Rfl: 0   metFORMIN (GLUCOPHAGE) 500 MG tablet, Take 1 tablet (500 mg total) by mouth 2 (two) times daily with a meal., Disp: 180 tablet, Rfl: 1   nitroGLYCERIN (NITROSTAT) 0.4 MG SL tablet, DISSOLVE ONE TABLET UNDER THE TONGUE EVERY 5 MINUTES AS NEEDED FOR CHEST PAIN.  DO NOT EXCEED A TOTAL OF 3 DOSES IN 15 MINUTES (Patient taking differently: DISSOLVE ONE TABLET UNDER THE TONGUE EVERY 5 MINUTES AS NEEDED FOR CHEST PAIN.  DO NOT EXCEED A TOTAL OF 3 DOSES IN 15 MINUTES), Disp: 25 tablet, Rfl: 0   omeprazole (PRILOSEC) 40 MG capsule, TAKE 1 CAPSULE (40 MG TOTAL) BY MOUTH DAILY., Disp: 90 capsule, Rfl: 0   Semaglutide, 2 MG/DOSE, (OZEMPIC, 2 MG/DOSE,) 8 MG/3ML SOPN, INJECT 2MG  INTO THE SKIN ONCE WEEKLY AS DIRECTED., Disp: 9 mL, Rfl: 0   silver sulfADIAZINE (SILVADENE) 1 % cream, Apply 1 application  topically 2 (two) times daily as needed (rash/skin irritation)., Disp: , Rfl:    simvastatin (ZOCOR) 40 MG tablet, TAKE ONE TABLET BY MOUTH ONCE DAILY AT 6PM., Disp: 90 tablet, Rfl: 0   SYMBICORT 80-4.5 MCG/ACT inhaler, Inhale 2 puffs into the lungs 2 (two) times daily., Disp: 10.2 g, Rfl: 3   zolpidem (AMBIEN) 10 MG tablet, Take 1 tablet (10 mg total) by mouth at bedtime., Disp: 30 tablet, Rfl: 5  Allergies  Allergen Reactions   Butrans [Buprenorphine] Other (See Comments)    Skin irritation    Objective:   There were no vitals taken for this visit.  Patient is well-developed, well-nourished in no acute distress.  Resting comfortably at home.  Head is normocephalic, atraumatic.  No labored breathing.  Speech is clear and coherent with  logical content.  Patient is alert and oriented at baseline.  Noted swelling to right side of face, dental caries   Assessment and Plan:   Madison Gentry was seen today for vaginitis.  Diagnoses and all orders for this visit:  Vaginal irritation Will treat with below. Report persistent or new symptoms.  -     fluconazole (DIFLUCAN) 150 MG tablet; Take 1 tablet (150 mg total) by mouth once for 1 dose.  Dental abscess Right facial swelling Aware to make a dental appointment as soon as possible. Will initiate below as pt is unsure of when she will be able to see a dentist. Pt aware of red flags which require emergent evaluation and treatment. Take full course of antibiotics.  -  amoxicillin-clavulanate (AUGMENTIN) 875-125 MG tablet; Take 1 tablet by mouth 2 (two) times daily for 10 days.      Return if symptoms worsen or fail to improve.  Kari Baars, FNP-C [facility] [address] [place], [place] [address] [phone]  10/11/2022  Time spent with the patient: 15 minutes, of which >50% was spent in obtaining information about symptoms, reviewing previous labs, evaluations, and treatments, counseling about condition (please see the discussed topics above), and developing a plan to further investigate it; had a number of questions which I addressed.

## 2022-10-11 NOTE — Telephone Encounter (Signed)
Informed pt that refill request is for a controlled medication which can only be refilled during a visit, this will have to be done at her visit on 10/19/22.

## 2022-10-11 NOTE — Telephone Encounter (Signed)
  Prescription Request  10/11/2022  Is this a "Controlled Substance" medicine?   Have you seen your PCP in the last 2 weeks? Has appt with PCP on 8/23  If YES, route message to pool  -  If NO, patient needs to be scheduled for appointment.  What is the name of the medication or equipment?  zolpidem (AMBIEN) 10 MG tablet  Have you contacted your pharmacy to request a refill? Yes    Which pharmacy would you like this sent to?  [facility] - [place], [place] - [address]     Patient notified that their request is being sent to the clinical staff for review and that they should receive a response within 2 business days.

## 2022-10-19 ENCOUNTER — Encounter: Payer: Self-pay | Admitting: Nurse Practitioner

## 2022-10-19 ENCOUNTER — Ambulatory Visit: Payer: Medicaid Other | Admitting: Nurse Practitioner

## 2022-10-19 VITALS — BP 125/77 | HR 86 | Temp 97.7°F | Resp 20 | Ht 62.0 in | Wt 195.0 lb

## 2022-10-19 DIAGNOSIS — I10 Essential (primary) hypertension: Secondary | ICD-10-CM

## 2022-10-19 DIAGNOSIS — J411 Mucopurulent chronic bronchitis: Secondary | ICD-10-CM

## 2022-10-19 DIAGNOSIS — N393 Stress incontinence (female) (male): Secondary | ICD-10-CM

## 2022-10-19 DIAGNOSIS — Z720 Tobacco use: Secondary | ICD-10-CM

## 2022-10-19 DIAGNOSIS — E1169 Type 2 diabetes mellitus with other specified complication: Secondary | ICD-10-CM

## 2022-10-19 DIAGNOSIS — I25119 Atherosclerotic heart disease of native coronary artery with unspecified angina pectoris: Secondary | ICD-10-CM

## 2022-10-19 DIAGNOSIS — E785 Hyperlipidemia, unspecified: Secondary | ICD-10-CM

## 2022-10-19 DIAGNOSIS — F3341 Major depressive disorder, recurrent, in partial remission: Secondary | ICD-10-CM

## 2022-10-19 DIAGNOSIS — F411 Generalized anxiety disorder: Secondary | ICD-10-CM

## 2022-10-19 DIAGNOSIS — E119 Type 2 diabetes mellitus without complications: Secondary | ICD-10-CM

## 2022-10-19 DIAGNOSIS — Z7984 Long term (current) use of oral hypoglycemic drugs: Secondary | ICD-10-CM

## 2022-10-19 DIAGNOSIS — K219 Gastro-esophageal reflux disease without esophagitis: Secondary | ICD-10-CM

## 2022-10-19 DIAGNOSIS — F5101 Primary insomnia: Secondary | ICD-10-CM

## 2022-10-19 LAB — CBC WITH DIFFERENTIAL/PLATELET
Basophils Absolute: 0.2 10*3/uL (ref 0.0–0.2)
Basos: 1 %
EOS (ABSOLUTE): 0.5 10*3/uL — ABNORMAL HIGH (ref 0.0–0.4)
Eos: 3 %
Hematocrit: 38.9 % (ref 34.0–46.6)
Hemoglobin: 12.9 g/dL (ref 11.1–15.9)
Immature Grans (Abs): 0 10*3/uL (ref 0.0–0.1)
Immature Granulocytes: 0 %
Lymphocytes Absolute: 3 10*3/uL (ref 0.7–3.1)
Lymphs: 18 %
MCH: 28.2 pg (ref 26.6–33.0)
MCHC: 33.2 g/dL (ref 31.5–35.7)
MCV: 85 fL (ref 79–97)
Monocytes Absolute: 0.9 10*3/uL (ref 0.1–0.9)
Monocytes: 5 %
Neutrophils Absolute: 11.8 10*3/uL — ABNORMAL HIGH (ref 1.4–7.0)
Neutrophils: 73 %
Platelets: 449 10*3/uL (ref 150–450)
RBC: 4.57 x10E6/uL (ref 3.77–5.28)
RDW: 13.6 % (ref 11.7–15.4)
WBC: 16.4 10*3/uL — ABNORMAL HIGH (ref 3.4–10.8)

## 2022-10-19 LAB — CMP14+EGFR
ALT: 15 IU/L (ref 0–32)
AST: 12 IU/L (ref 0–40)
Albumin: 4.1 g/dL (ref 3.8–4.9)
Alkaline Phosphatase: 78 IU/L (ref 44–121)
BUN/Creatinine Ratio: 25 — ABNORMAL HIGH (ref 9–23)
BUN: 14 mg/dL (ref 6–24)
Bilirubin Total: <0.2 mg/dL (ref 0.0–1.2)
CO2: 27 mmol/L (ref 20–29)
Calcium: 9.7 mg/dL (ref 8.7–10.2)
Chloride: 99 mmol/L (ref 96–106)
Creatinine, Ser: 0.56 mg/dL — ABNORMAL LOW (ref 0.57–1.00)
Globulin, Total: 2.6 g/dL (ref 1.5–4.5)
Glucose: 91 mg/dL (ref 70–99)
Potassium: 4.2 mmol/L (ref 3.5–5.2)
Sodium: 139 mmol/L (ref 134–144)
Total Protein: 6.7 g/dL (ref 6.0–8.5)
eGFR: 108 mL/min/{1.73_m2} (ref >59)

## 2022-10-19 LAB — LIPID PANEL
Chol/HDL Ratio: 5.3 ratio — ABNORMAL HIGH (ref 0.0–4.4)
Cholesterol, Total: 266 mg/dL — ABNORMAL HIGH (ref 100–199)
HDL: 50 mg/dL (ref >39)
LDL Chol Calc (NIH): 166 mg/dL — ABNORMAL HIGH (ref 0–99)
Triglycerides: 269 mg/dL — ABNORMAL HIGH (ref 0–149)
VLDL Cholesterol Cal: 50 mg/dL — ABNORMAL HIGH (ref 5–40)

## 2022-10-19 LAB — BAYER DCA HB A1C WAIVED: HB A1C (BAYER DCA - WAIVED): 6.1 % — ABNORMAL HIGH (ref 4.8–5.6)

## 2022-10-19 MED ORDER — BUSPIRONE HCL 7.5 MG PO TABS
7.5000 mg | ORAL_TABLET | Freq: Every day | ORAL | 2 refills | Status: DC
Start: 2022-10-19 — End: 2023-05-03

## 2022-10-19 MED ORDER — METFORMIN HCL 500 MG PO TABS
500.0000 mg | ORAL_TABLET | Freq: Two times a day (BID) | ORAL | 1 refills | Status: DC
Start: 2022-10-19 — End: 2023-05-03

## 2022-10-19 MED ORDER — AMLODIPINE BESYLATE 10 MG PO TABS: 10.0000 mg | ORAL_TABLET | Freq: Every day | ORAL | 1 refills | Status: DC

## 2022-10-19 MED ORDER — HYDROCHLOROTHIAZIDE 25 MG PO TABS
25.0000 mg | ORAL_TABLET | Freq: Every day | ORAL | 1 refills | Status: DC
Start: 2022-10-19 — End: 2023-05-03

## 2022-10-19 MED ORDER — SIMVASTATIN 40 MG PO TABS
ORAL_TABLET | ORAL | 1 refills | Status: DC
Start: 2022-10-19 — End: 2023-05-03

## 2022-10-19 MED ORDER — LISINOPRIL 10 MG PO TABS: 10.0000 mg | ORAL_TABLET | Freq: Every day | ORAL | 1 refills | Status: DC

## 2022-10-19 MED ORDER — ZOLPIDEM TARTRATE 10 MG PO TABS
10.0000 mg | ORAL_TABLET | Freq: Every day | ORAL | 5 refills | Status: DC
Start: 2022-10-19 — End: 2023-05-03

## 2022-10-19 MED ORDER — OZEMPIC (2 MG/DOSE) 8 MG/3ML ~~LOC~~ SOPN
2.0000 mg | PEN_INJECTOR | SUBCUTANEOUS | 1 refills | Status: DC
Start: 2022-10-19 — End: 2022-12-21

## 2022-10-19 MED ORDER — EZETIMIBE 10 MG PO TABS
10.0000 mg | ORAL_TABLET | Freq: Every day | ORAL | 1 refills | Status: DC
Start: 2022-10-19 — End: 2023-05-03

## 2022-10-19 MED ORDER — FLUOXETINE HCL 40 MG PO CAPS
40.0000 mg | ORAL_CAPSULE | Freq: Two times a day (BID) | ORAL | 1 refills | Status: DC
Start: 2022-10-19 — End: 2023-04-30

## 2022-10-19 MED ORDER — OMEPRAZOLE 40 MG PO CPDR: 40.0000 mg | DELAYED_RELEASE_CAPSULE | Freq: Every day | ORAL | 1 refills | Status: DC

## 2022-10-19 NOTE — Patient Instructions (Signed)

## 2022-10-19 NOTE — Progress Notes (Signed)
Subjective:    Patient ID: Madison Gentry, female    DOB: 1965-06-24, 57 y.o.   MRN: [id number]   Chief Complaint: medical management of chronic issues     HPI:  Madison Gentry is a 57 y.o. who identifies as a female who was assigned female at birth.   Social history: Lives with: husband Work history: disability   Comes in today for follow up of the following chronic medical issues:  1. Primary hypertension No c/o chest pain, sob or headache. Does not check blood pressure at home. BP Readings from Last 3 Encounters:  04/10/22 105/65  01/16/22 125/75  01/08/22 125/72     2. Hyperlipidemia associated with type 2 diabetes mellitus (HCC) Does not watch diet and does no dedicated exercise. Lab Results  Component Value Date   CHOL 124 04/10/2022   HDL 51 04/10/2022   LDLCALC 55 04/10/2022   TRIG 94 04/10/2022   CHOLHDL 2.4 04/10/2022     3. Diabetes mellitus treated with oral medication (HCC) She does not check her blood sugars very often. Lab Results  Component Value Date   HGBA1C 7.8 (H) 04/10/2022    4. Atherosclerosis of native coronary artery of native heart with angina pectoris (HCC) No chest pain. Has not seen cardiology  5. Mucopurulent chronic bronchitis (HCC) Is on symbicort daily. Has a frequent cough  6. Gastroesophageal reflux disease without esophagitis Is on omeprazole and is doing well.  7. GAD (generalized anxiety disorder) Is on buspar BID and is doing well.    10/19/2022   11:08 AM 04/10/2022    1:51 PM 01/08/2022    4:12 PM 11/16/2021   10:51 AM  GAD 7 : Generalized Anxiety Score  Nervous, Anxious, on Edge 3 1 2 1   Control/stop worrying 1 0 0 1  Worry too much - different things 1 1 1 1   Trouble relaxing 3 1 1 1   Restless 1 0 0 0  Easily annoyed or irritable 3 1 0 1  Afraid - awful might happen 0 0 0 1  Total GAD 7 Score 12 4 4 6   Anxiety Difficulty Not difficult at all Extremely difficult Very difficult Somewhat difficult      8.  Recurrent major depressive disorder, in partial remission (HCC) Has been on prozac for several years and is doing well.    10/19/2022   11:07 AM 04/10/2022    1:51 PM 01/08/2022    4:12 PM  Depression screen PHQ 2/9  Decreased Interest 1 3 3   Down, Depressed, Hopeless 1 0 2  PHQ - 2 Score 2 3 5   Altered sleeping 3 0 2  Tired, decreased energy 3 1 3   Change in appetite 2 1 0  Feeling bad or failure about yourself  0 0 1  Trouble concentrating 0 0 2  Moving slowly or fidgety/restless 0 0 0  Suicidal thoughts 0 0 0  PHQ-9 Score 10 5 13   Difficult doing work/chores Extremely dIfficult Extremely dIfficult Extremely dIfficult     9. Primary insomnia Is on ambien and sleeps about 7-8 hours a night.  10. Stress incontinence of urine Leaks urine and has to wear pads and depends al the time.  11. Tobacco user Still smokes over a pack a day.  12. Chronic midline low back pain without sciatica Pain assessment: Goes to pain management    13. Morbid obesity (HCC) No recent weight changes Wt Readings from Last 3 Encounters:  10/19/22 195 lb (88.5 kg)  04/10/22 195  lb (88.5 kg)  01/16/22 189 lb (85.7 kg)   BMI Readings from Last 3 Encounters:  10/19/22 35.67 kg/m  04/10/22 35.67 kg/m  01/16/22 34.57 kg/m     New complaints: None today  Allergies  Allergen Reactions   Butrans [Buprenorphine] Other (See Comments)    Skin irritation   Outpatient Encounter Medications as of 10/19/2022  Medication Sig   ACCU-CHEK GUIDE test strip USE 1 UP TO 4 TIMES DAILY AS DIRECTED   Accu-Chek Softclix Lancets lancets Use as directed up to 4 times daily as directed Dx E11.9   albuterol (VENTOLIN HFA) 108 (90 Base) MCG/ACT inhaler Inhale 2 puffs into the lungs every 6 (six) hours as needed for wheezing or shortness of breath.   amLODipine (NORVASC) 10 MG tablet TAKE ONE TABLET BY MOUTH ONCE DAILY.   amoxicillin-clavulanate (AUGMENTIN) 875-125 MG tablet Take 1 tablet by mouth 2 (two)  times daily for 10 days.   Aspirin-Acetaminophen-Caffeine 520-260-32.5 MG PACK Take 1 packet by mouth 2 (two) times daily as needed. For pain or headache   baclofen (LIORESAL) 10 MG tablet Take 1 tablet (10 mg total) by mouth 3 (three) times daily.   Blood Glucose Monitoring Suppl (ACCU-CHEK GUIDE ME) w/Device KIT USE AS DIRECTED UP TO 4 TIMES DAILY AS DIRECTED Dx E11.9   busPIRone (BUSPAR) 7.5 MG tablet Take 1 tablet (7.5 mg total) by mouth daily.   cyclobenzaprine (FLEXERIL) 10 MG tablet Take 1 tablet (10 mg total) by mouth 3 (three) times daily.   diclofenac Sodium (VOLTAREN) 1 % GEL Apply 2 g topically 4 (four) times daily as needed (pain).   ezetimibe (ZETIA) 10 MG tablet Take 1 tablet (10 mg total) by mouth daily. TAKE 1 TABLET BY MOUTH ONCE DAILY   FLUoxetine (PROZAC) 40 MG capsule Take 1 capsule (40 mg total) by mouth 2 (two) times daily.   hydrochlorothiazide (HYDRODIURIL) 25 MG tablet Take 1 tablet (25 mg total) by mouth daily.   ibuprofen (ADVIL,MOTRIN) 200 MG tablet Take 200 mg by mouth every 6 (six) hours as needed. Takes 800 mg when needed for menstrual cramping   Incontinence Supplies KIT Patient needs Pull ups, chuck pads and wipes.   lisinopril (ZESTRIL) 10 MG tablet TAKE ONE TABLET BY MOUTH ONCE DAILY.   metFORMIN (GLUCOPHAGE) 500 MG tablet Take 1 tablet (500 mg total) by mouth 2 (two) times daily with a meal.   nitroGLYCERIN (NITROSTAT) 0.4 MG SL tablet DISSOLVE ONE TABLET UNDER THE TONGUE EVERY 5 MINUTES AS NEEDED FOR CHEST PAIN.  DO NOT EXCEED A TOTAL OF 3 DOSES IN 15 MINUTES (Patient taking differently: DISSOLVE ONE TABLET UNDER THE TONGUE EVERY 5 MINUTES AS NEEDED FOR CHEST PAIN.  DO NOT EXCEED A TOTAL OF 3 DOSES IN 15 MINUTES)   omeprazole (PRILOSEC) 40 MG capsule TAKE 1 CAPSULE (40 MG TOTAL) BY MOUTH DAILY.   Semaglutide, 2 MG/DOSE, (OZEMPIC, 2 MG/DOSE,) 8 MG/3ML SOPN INJECT 2MG  INTO THE SKIN ONCE WEEKLY AS DIRECTED.   silver sulfADIAZINE (SILVADENE) 1 % cream Apply 1  application  topically 2 (two) times daily as needed (rash/skin irritation).   simvastatin (ZOCOR) 40 MG tablet TAKE ONE TABLET BY MOUTH ONCE DAILY AT 6PM.   SYMBICORT 80-4.5 MCG/ACT inhaler Inhale 2 puffs into the lungs 2 (two) times daily.   zolpidem (AMBIEN) 10 MG tablet Take 1 tablet (10 mg total) by mouth at bedtime.   No facility-administered encounter medications on file as of 10/19/2022.    Past Surgical History:  Procedure Laterality  Date   TUBAL LIGATION      Family History  Problem Relation Age of Onset   CAD Mother 31   CAD Father 101       Died age 24   CAD Brother 46      Controlled substance contract: n/a     Review of Systems  Constitutional:  Negative for diaphoresis.  Eyes:  Negative for pain.  Respiratory:  Negative for shortness of breath.   Cardiovascular:  Negative for chest pain, palpitations and leg swelling.  Gastrointestinal:  Negative for abdominal pain.  Endocrine: Negative for polydipsia.  Skin:  Negative for rash.  Neurological:  Negative for dizziness, weakness and headaches.  Hematological:  Does not bruise/bleed easily.  All other systems reviewed and are negative.      Objective:   Physical Exam Vitals and nursing note reviewed.  Constitutional:      General: She is not in acute distress.    Appearance: Normal appearance. She is well-developed.  HENT:     Head: Normocephalic.     Right Ear: Tympanic membrane normal.     Left Ear: Tympanic membrane normal.     Nose: Nose normal.     Mouth/Throat:     Mouth: Mucous membranes are moist.  Eyes:     Pupils: Pupils are equal, round, and reactive to light.  Neck:     Vascular: No carotid bruit or JVD.  Cardiovascular:     Rate and Rhythm: Normal rate and regular rhythm.     Heart sounds: Normal heart sounds.  Pulmonary:     Effort: Pulmonary effort is normal. No respiratory distress.     Breath sounds: Normal breath sounds. No wheezing or rales.  Chest:     Chest wall: No  tenderness.  Abdominal:     General: Bowel sounds are normal. There is no distension or abdominal bruit.     Palpations: Abdomen is soft. There is no hepatomegaly, splenomegaly, mass or pulsatile mass.     Tenderness: There is no abdominal tenderness.  Musculoskeletal:        General: Normal range of motion.     Cervical back: Normal range of motion and neck supple.  Lymphadenopathy:     Cervical: No cervical adenopathy.  Skin:    General: Skin is warm and dry.  Neurological:     Mental Status: She is alert and oriented to person, place, and time.     Deep Tendon Reflexes: Reflexes are normal and symmetric.  Psychiatric:        Behavior: Behavior normal.        Thought Content: Thought content normal.        Judgment: Judgment normal.    BP 125/77   Pulse 86   Temp 97.7 F (36.5 C) (Temporal)   Resp 20   Ht 5\' 2"  (1.575 m)   Wt 195 lb (88.5 kg)   SpO2 96%   BMI 35.67 kg/m   Hgba1c 6.1%      Assessment & Plan:   Madison Gentry comes in today with chief complaint of Medical Management of Chronic Issues   Diagnosis and orders addressed:  1. Primary hypertension Low sodium diet - CBC with Differential/Platelet - CMP14+EGFR - amLODipine (NORVASC) 10 MG tablet; Take 1 tablet (10 mg total) by mouth daily.  Dispense: 90 tablet; Refill: 1 - hydrochlorothiazide (HYDRODIURIL) 25 MG tablet; Take 1 tablet (25 mg total) by mouth daily.  Dispense: 90 tablet; Refill: 1 - lisinopril (ZESTRIL) 10 MG  tablet; Take 1 tablet (10 mg total) by mouth daily.  Dispense: 90 tablet; Refill: 1  2. Hyperlipidemia associated with type 2 diabetes mellitus (HCC) Low fat diet - Lipid panel - ezetimibe (ZETIA) 10 MG tablet; Take 1 tablet (10 mg total) by mouth daily. TAKE 1 TABLET BY MOUTH ONCE DAILY  Dispense: 90 tablet; Refill: 1 - simvastatin (ZOCOR) 40 MG tablet; TAKE ONE TABLET BY MOUTH ONCE DAILY AT 6PM.  Dispense: 90 tablet; Refill: 1  3. Diabetes mellitus treated with oral medication  (HCC) Continue  to watch carbs - Bayer DCA Hb A1c Waived - Microalbumin / creatinine urine ratio - metFORMIN (GLUCOPHAGE) 500 MG tablet; Take 1 tablet (500 mg total) by mouth 2 (two) times daily with a meal.  Dispense: 180 tablet; Refill: 1 - Semaglutide, 2 MG/DOSE, (OZEMPIC, 2 MG/DOSE,) 8 MG/3ML SOPN; Inject 2 mg into the muscle once a week.  Dispense: 9 mL; Refill: 1  4. Atherosclerosis of native coronary artery of native heart with angina pectoris (HCC)  5. Mucopurulent chronic bronchitis (HCC) Continue inhalers  6. Gastroesophageal reflux disease without esophagitis Avoid spicy foods Do not eat 2 hours prior to bedtime  - omeprazole (PRILOSEC) 40 MG capsule; Take 1 capsule (40 mg total) by mouth daily.  Dispense: 90 capsule; Refill: 1  7. GAD (generalized anxiety disorder) Stress management - busPIRone (BUSPAR) 7.5 MG tablet; Take 1 tablet (7.5 mg total) by mouth daily.  Dispense: 90 tablet; Refill: 2  8. Recurrent major depressive disorder, in partial remission (HCC) - FLUoxetine (PROZAC) 40 MG capsule; Take 1 capsule (40 mg total) by mouth 2 (two) times daily.  Dispense: 180 capsule; Refill: 1  9. Primary insomnia Bedtime routine - zolpidem (AMBIEN) 10 MG tablet; Take 1 tablet (10 mg total) by mouth at bedtime.  Dispense: 30 tablet; Refill: 5  10. Stress incontinence of urine  11. Tobacco user Smoking cessation encouraged  12. Chronic midline low back pain without sciatica Keep follow up with pain management  13. Morbid obesity (HCC) Discussed diet and exercise for person with BMI >25 Will recheck weight in 3-6 months    Labs pending Health Maintenance reviewed Diet and exercise encouraged  Follow up plan: 6 months   Mary-Margaret Daphine Deutscher, FNP

## 2022-10-20 LAB — MICROALBUMIN / CREATININE URINE RATIO
Creatinine, Urine: 70.8 mg/dL
Microalb/Creat Ratio: <4 mg/g{creat} (ref 0–29)
Microalbumin, Urine: <3 ug/mL

## 2022-12-21 ENCOUNTER — Other Ambulatory Visit: Payer: Self-pay | Admitting: *Deleted

## 2022-12-21 DIAGNOSIS — E119 Type 2 diabetes mellitus without complications: Secondary | ICD-10-CM

## 2022-12-21 MED ORDER — OZEMPIC (2 MG/DOSE) 8 MG/3ML ~~LOC~~ SOPN
2.0000 mg | PEN_INJECTOR | SUBCUTANEOUS | 1 refills | Status: DC
Start: 2022-12-21 — End: 2023-02-08

## 2023-01-06 ENCOUNTER — Other Ambulatory Visit: Payer: Self-pay | Admitting: Nurse Practitioner

## 2023-01-06 DIAGNOSIS — F411 Generalized anxiety disorder: Secondary | ICD-10-CM

## 2023-01-10 ENCOUNTER — Encounter: Payer: Self-pay | Admitting: Nurse Practitioner

## 2023-02-06 ENCOUNTER — Telehealth: Payer: Self-pay | Admitting: Nurse Practitioner

## 2023-02-08 ENCOUNTER — Other Ambulatory Visit: Payer: Self-pay | Admitting: Nurse Practitioner

## 2023-02-08 DIAGNOSIS — E119 Type 2 diabetes mellitus without complications: Secondary | ICD-10-CM

## 2023-03-29 ENCOUNTER — Encounter: Payer: Self-pay | Admitting: Nurse Practitioner

## 2023-03-29 ENCOUNTER — Telehealth: Payer: Self-pay | Admitting: Family Medicine

## 2023-03-29 ENCOUNTER — Ambulatory Visit: Payer: Medicaid Other | Admitting: Nurse Practitioner

## 2023-03-29 ENCOUNTER — Other Ambulatory Visit: Payer: Self-pay | Admitting: Nurse Practitioner

## 2023-03-29 VITALS — BP 126/78 | HR 93 | Temp 97.9°F | Ht 62.0 in | Wt 182.0 lb

## 2023-03-29 DIAGNOSIS — J440 Chronic obstructive pulmonary disease with acute lower respiratory infection: Secondary | ICD-10-CM

## 2023-03-29 DIAGNOSIS — R0981 Nasal congestion: Secondary | ICD-10-CM | POA: Diagnosis not present

## 2023-03-29 DIAGNOSIS — J209 Acute bronchitis, unspecified: Secondary | ICD-10-CM

## 2023-03-29 LAB — VERITOR FLU A/B WAIVED
Influenza A: NEGATIVE
Influenza B: NEGATIVE

## 2023-03-29 MED ORDER — BENZONATATE 100 MG PO CAPS: 100.0000 mg | ORAL_CAPSULE | Freq: Two times a day (BID) | ORAL | 0 refills | Status: DC | PRN

## 2023-03-29 MED ORDER — DOXYCYCLINE HYCLATE 100 MG PO TABS: 100.0000 mg | ORAL_TABLET | Freq: Two times a day (BID) | ORAL | 0 refills | Status: DC

## 2023-03-29 MED ORDER — METHYLPREDNISOLONE ACETATE 80 MG/ML IJ SUSP
80.0000 mg | Freq: Once | INTRAMUSCULAR | Status: AC
  Administered 2023-03-29: 80 mg via INTRAMUSCULAR

## 2023-03-29 NOTE — Progress Notes (Signed)
Subjective:    Patient ID: Madison Gentry, female    DOB: Dec 21, 1965, 58 y.o.   MRN: [id number]   Chief Complaint: Headache, Cough, and Wheezing   URI  This is a new problem. The current episode started in the past 7 days. The problem has been gradually worsening. There has been no fever. Associated symptoms include congestion, coughing, ear pain, headaches and rhinorrhea. Pertinent negatives include no sore throat. Treatments tried: dayquil and nyquil. The treatment provided mild relief.    Patient Active Problem List   Diagnosis Date Noted   Primary insomnia 02/01/2017   Gastroesophageal reflux disease without esophagitis 02/01/2017   GAD (generalized anxiety disorder) 02/01/2017   Narcotic abuse (HCC) 01/13/2015   COPD (chronic obstructive pulmonary disease) (HCC) 06/15/2013   Back pain 06/15/2013   Urinary incontinence 06/15/2013   Hypertension 11/07/2012   Tobacco user 09/22/2012   Diabetes mellitus treated with oral medication (HCC) 09/22/2012   Hyperlipidemia associated with type 2 diabetes mellitus (HCC) 09/22/2012   Morbid obesity (HCC) 02/11/2011   Depression 02/11/2011   Coronary atherosclerosis 01/18/2009       Review of Systems  HENT:  Positive for congestion, ear pain and rhinorrhea. Negative for sore throat.   Respiratory:  Positive for cough.   Neurological:  Positive for headaches.       Objective:   Physical Exam Constitutional:      Appearance: Normal appearance. She is well-developed. She is obese.  Cardiovascular:     Rate and Rhythm: Normal rate and regular rhythm.     Heart sounds: Normal heart sounds.  Pulmonary:     Breath sounds: Wheezing (faint expiratory wheezes) present.  Neurological:     Mental Status: She is alert.  Psychiatric:        Behavior: Behavior normal.     BP 126/78   Pulse 93   Temp 97.9 F (36.6 C) (Temporal)   Ht 5\' 2"  (1.575 m)   Wt 182 lb (82.6 kg)   SpO2 96%   BMI 33.29 kg/m      Assessment & Plan:    Madison Gentry in today with chief complaint of Headache, Cough, and Wheezing   1. Nasal congestion (Primary)  - Veritor Flu A/B Waived - Novel Coronavirus, NAA ([facility])  2. Acute bronchitis with COPD (HCC) 1. Take meds as prescribed 2. Use a cool mist humidifier especially during the winter months and when heat has been humid. 3. Use saline nose sprays frequently 4. Saline irrigations of the nose can be very helpful if done frequently.  * 4X daily for 1 week*  * Use of a nettie pot can be helpful with this. Follow directions with this* 5. Drink plenty of fluids 6. Keep thermostat turn down low 7.For any cough or congestion- tessalon perles 8. For fever or aces or pains- take tylenol or ibuprofen appropriate for age and weight.  * for fevers greater than 101 orally you may alternate ibuprofen and tylenol every  3 hours.    - doxycycline (VIBRA-TABS) 100 MG tablet; Take 1 tablet (100 mg total) by mouth 2 (two) times daily. 1 po bid  Dispense: 20 tablet; Refill: 0    The above assessment and management plan was discussed with the patient. The patient verbalized understanding of and has agreed to the management plan. Patient is aware to call the clinic if symptoms persist or worsen. Patient is aware when to return to the clinic for a follow-up visit. Patient educated on when it is appropriate  to go to the emergency department.   Mary-Margaret Daphine Deutscher, FNP

## 2023-03-29 NOTE — Telephone Encounter (Signed)
Patient notified that it may take a while for steroid shot to work and to give more time. Patient verbalized understanding

## 2023-03-29 NOTE — Telephone Encounter (Signed)
Last Fill: Unk  Last OV: 03/29/23 Next OV: 04/22/23  Routing to provider for review/authorization.

## 2023-03-29 NOTE — Patient Instructions (Signed)

## 2023-03-29 NOTE — Telephone Encounter (Signed)
Copied from CRM [id number]. Topic: Clinical - Medication Refill >> Mar 29, 2023  1:01 PM Antony Haste wrote: Most Recent Primary Care Visit:  Provider: Bennie Pierini  Department: [facility]  Visit Type: OFFICE VISIT  Date: 10/19/2022  Medication: predniSONE (DELTASONE) 20 MG tablet  Has the patient contacted their pharmacy? Yes (Agent: If no, request that the patient contact the pharmacy for the refill. If patient does not wish to contact the pharmacy document the reason why and proceed with request.) (Agent: If yes, when and what did the pharmacy advise?)  Is this the correct pharmacy for this prescription? Yes If no, delete pharmacy and type the correct one.  This is the patient's preferred pharmacy:   [facility] - [place], [place] - [address] [place] [address] Phone: [phone] Fax: [phone]    Has the prescription been filled recently? No, pt states she is unsure if she received her IM injection at her appt today, she is wanting this called in for her symptoms.  Is the patient out of the medication? Yes  Has the patient been seen for an appointment in the last year OR does the patient have an upcoming appointment? Yes  Can we respond through MyChart? No  Agent: Please be advised that Rx refills may take up to 3 business days. We ask that you follow-up with your pharmacy.

## 2023-03-29 NOTE — Telephone Encounter (Signed)
Copied from CRM [id number]. Topic: Clinical - Medical Advice >> Mar 29, 2023  2:30 PM Lovey Newcomer R wrote: Reason for CRM: Patient was seen today, but still does not feel any better after the steroid shot she got, didn't feel the burning sensation or anything. Wanted to speak further with nurse Maralyn Sago about it. Please contact patient

## 2023-03-30 LAB — NOVEL CORONAVIRUS, NAA: SARS-CoV-2, NAA: NOT DETECTED

## 2023-04-01 ENCOUNTER — Ambulatory Visit: Payer: Medicaid Other | Admitting: Nurse Practitioner

## 2023-04-01 ENCOUNTER — Telehealth: Payer: Self-pay | Admitting: Nurse Practitioner

## 2023-04-01 NOTE — Telephone Encounter (Signed)
She has taken to many steroids up to this point. Do not want to prescribe unless we have to.

## 2023-04-01 NOTE — Telephone Encounter (Signed)
Copied from CRM [id number]. Topic: Clinical - Medical Advice >> Apr 01, 2023  8:34 AM Madison Gentry wrote: Reason for CRM: Pt is still experiencing symptoms and would like to speak to a nurse or Dr. Gennette Pac about getting Premizone Packet prescribed to her. Callback [phone]

## 2023-04-01 NOTE — Telephone Encounter (Signed)
Still wants prednisone

## 2023-04-02 ENCOUNTER — Telehealth: Payer: Self-pay | Admitting: Nurse Practitioner

## 2023-04-02 NOTE — Telephone Encounter (Signed)
 Patient notified and verbalized understanding.

## 2023-04-02 NOTE — Telephone Encounter (Signed)
Steroids do not treat stuffy  nose sneezing and headache. Again she has had to may steroids recently. This is not good for her overall health.

## 2023-04-02 NOTE — Telephone Encounter (Signed)
Duplicate telephone message. See other encounter

## 2023-04-02 NOTE — Telephone Encounter (Signed)
 Copied from CRM [id number]. Topic: Clinical - Medical Advice >> Apr 02, 2023 11:22 AM Alfonso ORN wrote: Reason for CRM: Patient calling back to check on status of getting the  Prednisone . Reason was seen with Annita Rollene Lunger on  03/29/23    still not feel well have the same symptoms stuffy nose,sneezing, coughing and headache . Callback [phone] - if possible to callback before 3pm

## 2023-04-10 ENCOUNTER — Telehealth: Payer: Self-pay | Admitting: Nurse Practitioner

## 2023-04-10 ENCOUNTER — Ambulatory Visit: Payer: Self-pay | Admitting: Nurse Practitioner

## 2023-04-10 NOTE — Telephone Encounter (Signed)
Patient was put on antibiotics and would like prescription put in for a yeast infection. Would like to request medication for yeast infection.  Chief Complaint: vaginal discharge Symptoms: itching, vaginal irritation & discharge Frequency: started yesterday Pertinent Negatives: Patient denies started yesterday Disposition: [] ED /[] Urgent Care (no appt availability in office) / [] Appointment(In office/virtual)/ []  [facility]/ [x] Home Care/ [] Refused Recommended Disposition /[] [facility]/ []  Follow-up with PCP Additional Notes: usually gets yeast infetction after ABX.  Patient would like medication for yeast infection.  Reason for Disposition  Normal vaginal discharge  Answer Assessment - Initial Assessment Questions 1. DISCHARGE: "Describe the discharge." (e.g., white, yellow, green, gray, foamy, cottage cheese-like)     unknown 2. ODOR: "Is there a bad odor?"     no 3. ONSET: "When did the discharge begin?"     Started yesterday 4. RASH: "Is there a rash in the genital area?" If Yes, ask: "Describe it." (e.g., redness, blisters, sores, bumps)     N/a 5. ABDOMEN PAIN: "Are you having any abdomen pain?" If Yes, ask: "What does it feel like? " (e.g., crampy, dull, intermittent, constant)      N/a 6. ABDOMEN PAIN SEVERITY: If present, ask: "How bad is it?" (e.g., Scale 1-10; mild, moderate, or severe)   - MILD (1-3): Doesn't interfere with normal activities, abdomen soft and not tender to touch.    - MODERATE (4-7): Interferes with normal activities or awakens from sleep, abdomen tender to touch.    - SEVERE (8-10): Excruciating pain, doubled over, unable to do any normal activities. (R/O peritonitis)      N/a 7. CAUSE: "What do you think is causing the discharge?" "Have you had the same problem before? What happened then?"     ABX use 8. OTHER SYMPTOMS: "Do you have any other symptoms?" (e.g., fever, itching, vaginal bleeding, pain with urination, injury to  genital area, vaginal foreign body)     Itching , vaginal irritation, vaginal discharge  9. PREGNANCY: "Is there any chance you are pregnant?" "When was your last menstrual period?"     N/a  Protocols used: Vaginal Discharge-A-AH

## 2023-04-10 NOTE — Telephone Encounter (Signed)
Copied from CRM [id number]. Topic: Clinical - Medication Refill >> Apr 10, 2023 11:05 AM Higinio Roger wrote: Most Recent Primary Care Visit:  Provider: Bennie Pierini  [facility]  Visit Type: ACUTE  Date: 03/29/2023  Medication: Diflucan (fluconazole)  Has the patient contacted their pharmacy? No (Agent: If no, request that the patient contact the pharmacy for the refill. If patient does not wish to contact the pharmacy document the reason why and proceed with request.) (Agent: If yes, when and what did the pharmacy advise?)  Patient was put on antibiotics and would like prescription put in for a yeast infection.  Is this the correct pharmacy for this prescription? Yes If no, delete pharmacy and type the correct one.  This is the patient's preferred pharmacy:   [facility] - [place], [place] - [address] [place] [address] Phone: [phone] Fax: [phone]   Has the prescription been filled recently? No  Is the patient out of the medication? Yes  Has the patient been seen for an appointment in the last year OR does the patient have an upcoming appointment? Yes  Can we respond through MyChart? No. Patient would like callback at [phone]  Agent: Please be advised that Rx refills may take up to 3 business days. We ask that you follow-up with your pharmacy.

## 2023-04-10 NOTE — Telephone Encounter (Signed)
Left message making pt aware that she needs an appt and to call the office to schedule

## 2023-04-10 NOTE — Telephone Encounter (Signed)
See nurse triage note.

## 2023-04-16 ENCOUNTER — Other Ambulatory Visit (HOSPITAL_COMMUNITY): Payer: Self-pay

## 2023-04-16 ENCOUNTER — Telehealth: Payer: Self-pay

## 2023-04-16 NOTE — Telephone Encounter (Signed)
 Pharmacy Patient Advocate Encounter   Received notification from CoverMyMeds that prior authorization for Ozempic (2 MG/DOSE) 8MG /3ML pen-injectors  is required/requested.   Insurance verification completed.   The patient is insured through [facility] MEDICAID.   Per test claim: PA required; PA submitted to above mentioned insurance via CoverMyMeds Key/confirmation #/EOC (Key: [id number])   Status is pending

## 2023-04-17 ENCOUNTER — Other Ambulatory Visit (HOSPITAL_COMMUNITY): Payer: Self-pay

## 2023-04-17 ENCOUNTER — Other Ambulatory Visit: Payer: Self-pay | Admitting: Nurse Practitioner

## 2023-04-17 DIAGNOSIS — F5101 Primary insomnia: Secondary | ICD-10-CM

## 2023-04-17 NOTE — Telephone Encounter (Signed)
 Pharmacy Patient Advocate Encounter  Received notification from [facility] MEDICAID that Prior Authorization for Ozempic (2 MG/DOSE) 8MG /3ML pen-injectors has been APPROVED from 2.19.25 to 2.19.26. Ran test claim, Copay is $RTS , RX WAS LAST FILLED ON 03/28/23. This test claim was processed through [facility]- copay amounts may vary at other pharmacies due to pharmacy/plan contracts, or as the patient moves through the different stages of their insurance plan.   PA #/Case ID/Reference #: (Key: [id number])

## 2023-04-18 NOTE — Patient Instructions (Incomplete)
 Our records indicate that you are due for your screening mammogram.  Please call the imaging center that does your yearly mammograms to make an appointment for a mammogram at your earliest convenience. Our office also has a mobile unit through the [facility] that comes to our location. Please call our office if you would like to make an appointment.

## 2023-04-22 ENCOUNTER — Ambulatory Visit: Payer: Medicaid Other | Admitting: Nurse Practitioner

## 2023-04-24 ENCOUNTER — Encounter: Payer: Self-pay | Admitting: Nurse Practitioner

## 2023-04-26 ENCOUNTER — Other Ambulatory Visit (HOSPITAL_COMMUNITY): Payer: Self-pay

## 2023-04-30 ENCOUNTER — Other Ambulatory Visit: Payer: Self-pay | Admitting: Nurse Practitioner

## 2023-04-30 DIAGNOSIS — K219 Gastro-esophageal reflux disease without esophagitis: Secondary | ICD-10-CM

## 2023-04-30 DIAGNOSIS — F5101 Primary insomnia: Secondary | ICD-10-CM

## 2023-04-30 DIAGNOSIS — F3341 Major depressive disorder, recurrent, in partial remission: Secondary | ICD-10-CM

## 2023-05-03 ENCOUNTER — Ambulatory Visit: Payer: Self-pay | Admitting: Nurse Practitioner

## 2023-05-03 ENCOUNTER — Ambulatory Visit: Payer: Medicaid Other | Admitting: Nurse Practitioner

## 2023-05-03 ENCOUNTER — Encounter: Payer: Self-pay | Admitting: Nurse Practitioner

## 2023-05-03 VITALS — BP 135/75 | HR 95 | Temp 97.2°F

## 2023-05-03 DIAGNOSIS — I10 Essential (primary) hypertension: Secondary | ICD-10-CM | POA: Diagnosis not present

## 2023-05-03 DIAGNOSIS — M545 Low back pain, unspecified: Secondary | ICD-10-CM

## 2023-05-03 DIAGNOSIS — E1169 Type 2 diabetes mellitus with other specified complication: Secondary | ICD-10-CM | POA: Diagnosis not present

## 2023-05-03 DIAGNOSIS — F5101 Primary insomnia: Secondary | ICD-10-CM

## 2023-05-03 DIAGNOSIS — F411 Generalized anxiety disorder: Secondary | ICD-10-CM | POA: Diagnosis not present

## 2023-05-03 DIAGNOSIS — Z7984 Long term (current) use of oral hypoglycemic drugs: Secondary | ICD-10-CM

## 2023-05-03 DIAGNOSIS — N393 Stress incontinence (female) (male): Secondary | ICD-10-CM

## 2023-05-03 DIAGNOSIS — K219 Gastro-esophageal reflux disease without esophagitis: Secondary | ICD-10-CM

## 2023-05-03 DIAGNOSIS — F3341 Major depressive disorder, recurrent, in partial remission: Secondary | ICD-10-CM

## 2023-05-03 DIAGNOSIS — Z720 Tobacco use: Secondary | ICD-10-CM

## 2023-05-03 DIAGNOSIS — G8929 Other chronic pain: Secondary | ICD-10-CM

## 2023-05-03 DIAGNOSIS — E119 Type 2 diabetes mellitus without complications: Secondary | ICD-10-CM

## 2023-05-03 DIAGNOSIS — F1110 Opioid abuse, uncomplicated: Secondary | ICD-10-CM

## 2023-05-03 DIAGNOSIS — J411 Mucopurulent chronic bronchitis: Secondary | ICD-10-CM | POA: Diagnosis not present

## 2023-05-03 LAB — BAYER DCA HB A1C WAIVED: HB A1C (BAYER DCA - WAIVED): 6.4 % — ABNORMAL HIGH (ref 4.8–5.6)

## 2023-05-03 MED ORDER — AMLODIPINE BESYLATE 10 MG PO TABS: 10.0000 mg | ORAL_TABLET | Freq: Every day | ORAL | 1 refills | Status: DC

## 2023-05-03 MED ORDER — OMEPRAZOLE 40 MG PO CPDR: 40.0000 mg | DELAYED_RELEASE_CAPSULE | Freq: Every day | ORAL | 1 refills | Status: DC

## 2023-05-03 MED ORDER — ZOLPIDEM TARTRATE 10 MG PO TABS: 10.0000 mg | ORAL_TABLET | Freq: Every day | ORAL | 5 refills | Status: DC

## 2023-05-03 MED ORDER — SIMVASTATIN 40 MG PO TABS: ORAL_TABLET | ORAL | 1 refills | Status: DC

## 2023-05-03 MED ORDER — OZEMPIC (2 MG/DOSE) 8 MG/3ML ~~LOC~~ SOPN: 2.0000 mg | PEN_INJECTOR | SUBCUTANEOUS | 3 refills | Status: DC

## 2023-05-03 MED ORDER — METFORMIN HCL 500 MG PO TABS
500.0000 mg | ORAL_TABLET | Freq: Two times a day (BID) | ORAL | 1 refills | Status: DC
Start: 2023-05-03 — End: 2023-11-12

## 2023-05-03 MED ORDER — LISINOPRIL 10 MG PO TABS: 10.0000 mg | ORAL_TABLET | Freq: Every day | ORAL | 1 refills | Status: DC

## 2023-05-03 MED ORDER — HYDROCHLOROTHIAZIDE 25 MG PO TABS
25.0000 mg | ORAL_TABLET | Freq: Every day | ORAL | 1 refills | Status: DC
Start: 2023-05-03 — End: 2023-11-12

## 2023-05-03 MED ORDER — EZETIMIBE 10 MG PO TABS: 10.0000 mg | ORAL_TABLET | Freq: Every day | ORAL | 1 refills | Status: DC

## 2023-05-03 MED ORDER — ROSUVASTATIN CALCIUM 20 MG PO TABS: 20.0000 mg | ORAL_TABLET | Freq: Every day | ORAL | 3 refills | Status: DC

## 2023-05-03 MED ORDER — BUSPIRONE HCL 7.5 MG PO TABS: 7.5000 mg | ORAL_TABLET | Freq: Every day | ORAL | 2 refills | Status: DC

## 2023-05-03 NOTE — Telephone Encounter (Signed)
 Copied from CRM [id number]. Topic: Clinical - Red Word Triage >> May 03, 2023  4:31 PM Albin Felling L wrote: Red Word that prompted transfer to Nurse Triage: in a lot of pain, requesting HYDROcodone-acetaminophen (NORCO) 10-325 MG tablet   Patient reports that she went to pain management today but was told she couldn't be seen and was referred to another pain clinic. Patient states she is out of her pain medication and wanted to know if a prescription could be sent for her until she can be seen at the new pain clinic.    Reason for Disposition  Prescription request for new medicine (not a refill)  Answer Assessment - Initial Assessment Questions 1. NAME of MEDICINE: "What medicine(s) are you calling about?"     HYDROcodone-acetaminophen (NORCO) 10-325 MG tablet 2. QUESTION: "What is your question?" (e.g., double dose of medicine, side effect)     Would like a prescription sent  3. PRESCRIBER: "Who prescribed the medicine?" Reason: if prescribed by specialist, call should be referred to that group.     Pain management  Protocols used: Medication Question Call-A-AH

## 2023-05-03 NOTE — Patient Instructions (Signed)
 Fall Prevention in the Home, Adult Falls can cause injuries and can happen to people of all ages. There are many things you can do to make your home safer and to help prevent falls. What actions can I take to prevent falls? General information Use good lighting in all rooms. Make sure to: Replace any light bulbs that burn out. Turn on the lights in dark areas and use night-lights. Keep items that you use often in easy-to-reach places. Lower the shelves around your home if needed. Move furniture so that there are clear paths around it. Do not use throw rugs or other things on the floor that can make you trip. If any of your floors are uneven, fix them. Add color or contrast paint or tape to clearly mark and help you see: Grab bars or handrails. First and last steps of staircases. Where the edge of each step is. If you use a ladder or stepladder: Make sure that it is fully opened. Do not climb a closed ladder. Make sure the sides of the ladder are locked in place. Have someone hold the ladder while you use it. Know where your pets are as you move through your home. What can I do in the bathroom?     Keep the floor dry. Clean up any water on the floor right away. Remove soap buildup in the bathtub or shower. Buildup makes bathtubs and showers slippery. Use non-skid mats or decals on the floor of the bathtub or shower. Attach bath mats securely with double-sided, non-slip rug tape. If you need to sit down in the shower, use a non-slip stool. Install grab bars by the toilet and in the bathtub and shower. Do not use towel bars as grab bars. What can I do in the bedroom? Make sure that you have a light by your bed that is easy to reach. Do not use any sheets or blankets on your bed that hang to the floor. Have a firm chair or bench with side arms that you can use for support when you get dressed. What can I do in the kitchen? Clean up any spills right away. If you need to reach something  above you, use a step stool with a grab bar. Keep electrical cords out of the way. Do not use floor polish or wax that makes floors slippery. What can I do with my stairs? Do not leave anything on the stairs. Make sure that you have a light switch at the top and the bottom of the stairs. Make sure that there are handrails on both sides of the stairs. Fix handrails that are broken or loose. Install non-slip stair treads on all your stairs if they do not have carpet. Avoid having throw rugs at the top or bottom of the stairs. Choose a carpet that does not hide the edge of the steps on the stairs. Make sure that the carpet is firmly attached to the stairs. Fix carpet that is loose or worn. What can I do on the outside of my home? Use bright outdoor lighting. Fix the edges of walkways and driveways and fix any cracks. Clear paths of anything that can make you trip, such as tools or rocks. Add color or contrast paint or tape to clearly mark and help you see anything that might make you trip as you walk through a door, such as a raised step or threshold. Trim any bushes or trees on paths to your home. Check to see if handrails are loose  or broken and that both sides of all steps have handrails. Install guardrails along the edges of any raised decks and porches. Have leaves, snow, or ice cleared regularly. Use sand, salt, or ice melter on paths if you live where there is ice and snow during the winter. Clean up any spills in your garage right away. This includes grease or oil spills. What other actions can I take? Review your medicines with your doctor. Some medicines can cause dizziness or changes in blood pressure, which increase your risk of falling. Wear shoes that: Have a low heel. Do not wear high heels. Have rubber bottoms and are closed at the toe. Feel good on your feet and fit well. Use tools that help you move around if needed. These include: Canes. Walkers. Scooters. Crutches. Ask  your doctor what else you can do to help prevent falls. This may include seeing a physical therapist to learn to do exercises to move better and get stronger. Where to find more information Centers for Disease Control and Prevention, [employer]: TonerPromos.no [employer] on Aging: BaseRingTones.pl National Institute on Aging: BaseRingTones.pl Contact a doctor if: You are afraid of falling at home. You feel weak, drowsy, or dizzy at home. You fall at home. Get help right away if you: Lose consciousness or have trouble moving after a fall. Have a fall that causes a head injury. These symptoms may be an emergency. Get help right away. Call 911. Do not wait to see if the symptoms will go away. Do not drive yourself to the hospital. This information is not intended to replace advice given to you by your health care provider. Make sure you discuss any questions you have with your health care provider. Document Revised: 10/16/2021 Document Reviewed: 10/16/2021 Elsevier Patient Education  2024 [employer].

## 2023-05-03 NOTE — Progress Notes (Signed)
 Subjective:    Patient ID: Madison Gentry, female    DOB: [birth year], 58 y.o.   MRN: [id number]   Chief Complaint: medical management of chronic issues     HPI:  Madison Gentry is a 58 y.o. who identifies as a female who was assigned female at birth.   Social history: Lives with: husband Work history: disability   Comes in today for follow up of the following chronic medical issues:  1. Primary hypertension No c/o chest pain, sob or headache. Does not check blood pressure at home. BP Readings from Last 3 Encounters:  03/29/23 126/78  10/19/22 125/77  04/10/22 105/65     2. Hyperlipidemia associated with type 2 diabetes mellitus (HCC) Does not watch diet and does no dedicated exercise. She is on zocor and zetia. Lab Results  Component Value Date   CHOL 266 (H) 10/19/2022   HDL 50 10/19/2022   LDLCALC 166 (H) 10/19/2022   TRIG 269 (H) 10/19/2022   CHOLHDL 5.3 (H) 10/19/2022     3. Diabetes mellitus treated with oral medication (HCC) She does not check her blood sugars very often. Lab Results  Component Value Date   HGBA1C 6.1 (H) 10/19/2022    4. Atherosclerosis of native coronary artery of native heart with angina pectoris (HCC) No chest pain. Has not seen cardiology  5. Mucopurulent chronic bronchitis (HCC) Is on symbicort daily. Has a frequent cough  6. Gastroesophageal reflux disease without esophagitis Is on omeprazole and is doing well.  7. GAD (generalized anxiety disorder) Is on buspar BID and is doing well.    05/03/2023   10:03 AM 03/29/2023    9:25 AM 10/19/2022   11:08 AM 04/10/2022    1:51 PM  GAD 7 : Generalized Anxiety Score  Nervous, Anxious, on Edge 2 3 3 1   Control/stop worrying 2 3 1  0  Worry too much - different things 2 3 1 1   Trouble relaxing 2 0 3 1  Restless 0 0 1 0  Easily annoyed or irritable 3 3 3 1   Afraid - awful might happen 1 1 0 0  Total GAD 7 Score 12 13 12 4   Anxiety Difficulty Extremely difficult Extremely difficult Not  difficult at all Extremely difficult        8. Recurrent major depressive disorder, in partial remission (HCC) Has been on prozac for several years and is doing well.    05/03/2023   10:03 AM 03/29/2023    9:25 AM 10/19/2022   11:07 AM  Depression screen PHQ 2/9  Decreased Interest 1 3 1   Down, Depressed, Hopeless 2 3 1   PHQ - 2 Score 3 6 2   Altered sleeping 3 3 3   Tired, decreased energy 3 3 3   Change in appetite 1 1 2   Feeling bad or failure about yourself  0 0 0  Trouble concentrating 2 2 0  Moving slowly or fidgety/restless 1 0 0  Suicidal thoughts 0 0 0  PHQ-9 Score 13 15 10   Difficult doing work/chores Extremely dIfficult Extremely dIfficult Extremely dIfficult      9. Primary insomnia Is on ambien and sleeps about 7-8 hours a night.  10. Stress incontinence of urine Leaks urine and has to wear pads and depends al the time.  11. Tobacco user Still smokes over a pack a day.  12. Chronic midline low back pain without sciatica Pain assessment: Goes to pain management    13. Morbid obesity (HCC) Weight is down 13lbs  Wt Readings  from Last 3 Encounters:  03/29/23 182 lb (82.6 kg)  10/19/22 195 lb (88.5 kg)  04/10/22 195 lb (88.5 kg)   BMI Readings from Last 3 Encounters:  03/29/23 33.29 kg/m  10/19/22 35.67 kg/m  04/10/22 35.67 kg/m      New complaints: None today  Allergies  Allergen Reactions   Butrans [Buprenorphine] Other (See Comments)    Skin irritation   Outpatient Encounter Medications as of 05/03/2023  Medication Sig   ACCU-CHEK GUIDE test strip USE 1 UP TO 4 TIMES DAILY AS DIRECTED   Accu-Chek Softclix Lancets lancets Use as directed up to 4 times daily as directed Dx E11.9   albuterol (VENTOLIN HFA) 108 (90 Base) MCG/ACT inhaler Inhale 2 puffs into the lungs every 6 (six) hours as needed for wheezing or shortness of breath.   amLODipine (NORVASC) 10 MG tablet Take 1 tablet (10 mg total) by mouth daily.    Aspirin-Acetaminophen-Caffeine 520-260-32.5 MG PACK Take 1 packet by mouth 2 (two) times daily as needed. For pain or headache   baclofen (LIORESAL) 10 MG tablet Take 1 tablet (10 mg total) by mouth 3 (three) times daily.   benzonatate (TESSALON) 100 MG capsule Take 1 capsule (100 mg total) by mouth 2 (two) times daily as needed for cough.   Blood Glucose Monitoring Suppl (ACCU-CHEK GUIDE ME) w/Device KIT USE AS DIRECTED UP TO 4 TIMES DAILY AS DIRECTED Dx E11.9   busPIRone (BUSPAR) 7.5 MG tablet Take 1 tablet (7.5 mg total) by mouth daily.   cyclobenzaprine (FLEXERIL) 10 MG tablet Take 1 tablet (10 mg total) by mouth 3 (three) times daily.   diclofenac Sodium (VOLTAREN) 1 % GEL Apply 2 g topically 4 (four) times daily as needed (pain).   doxycycline (VIBRA-TABS) 100 MG tablet Take 1 tablet (100 mg total) by mouth 2 (two) times daily. 1 po bid   ezetimibe (ZETIA) 10 MG tablet Take 1 tablet (10 mg total) by mouth daily. TAKE 1 TABLET BY MOUTH ONCE DAILY   FLUoxetine (PROZAC) 40 MG capsule TAKE ONE CAPSULE BY MOUTH 2 TIMES A DAY   hydrochlorothiazide (HYDRODIURIL) 25 MG tablet Take 1 tablet (25 mg total) by mouth daily.   ibuprofen (ADVIL,MOTRIN) 200 MG tablet Take 200 mg by mouth every 6 (six) hours as needed. Takes 800 mg when needed for menstrual cramping   Incontinence Supplies KIT Patient needs Pull ups, chuck pads and wipes.   lisinopril (ZESTRIL) 10 MG tablet Take 1 tablet (10 mg total) by mouth daily.   metFORMIN (GLUCOPHAGE) 500 MG tablet Take 1 tablet (500 mg total) by mouth 2 (two) times daily with a meal.   nitroGLYCERIN (NITROSTAT) 0.4 MG SL tablet DISSOLVE ONE TABLET UNDER THE TONGUE EVERY 5 MINUTES AS NEEDED FOR CHEST PAIN.  DO NOT EXCEED A TOTAL OF 3 DOSES IN 15 MINUTES (Patient taking differently: No sig reported)   omeprazole (PRILOSEC) 40 MG capsule TAKE ONE CAPSULE BY MOUTH EVERY DAY   predniSONE (DELTASONE) 20 MG tablet Take 20 mg by mouth daily with breakfast.   Semaglutide, 2  MG/DOSE, (OZEMPIC, 2 MG/DOSE,) 8 MG/3ML SOPN Inject 2 mg into the muscle once a week.   silver sulfADIAZINE (SILVADENE) 1 % cream Apply 1 application  topically 2 (two) times daily as needed (rash/skin irritation).   simvastatin (ZOCOR) 40 MG tablet TAKE ONE TABLET BY MOUTH ONCE DAILY AT 6PM.   zolpidem (AMBIEN) 10 MG tablet Take 1 tablet (10 mg total) by mouth at bedtime.   No facility-administered encounter  medications on file as of 05/03/2023.    Past Surgical History:  Procedure Laterality Date   TUBAL LIGATION      Family History  Problem Relation Age of Onset   CAD Mother 49   CAD Father 39       Died age 50   CAD Brother 50      Controlled substance contract: n/a     Review of Systems  Constitutional:  Negative for diaphoresis.  Eyes:  Negative for pain.  Respiratory:  Negative for shortness of breath.   Cardiovascular:  Negative for chest pain, palpitations and leg swelling.  Gastrointestinal:  Negative for abdominal pain.  Endocrine: Negative for polydipsia.  Skin:  Negative for rash.  Neurological:  Negative for dizziness, weakness and headaches.  Hematological:  Does not bruise/bleed easily.  All other systems reviewed and are negative.      Objective:   Physical Exam Vitals and nursing note reviewed.  Constitutional:      General: She is not in acute distress.    Appearance: Normal appearance. She is well-developed.  HENT:     Head: Normocephalic.     Right Ear: Tympanic membrane normal.     Left Ear: Tympanic membrane normal.     Nose: Nose normal.     Mouth/Throat:     Mouth: Mucous membranes are moist.  Eyes:     Pupils: Pupils are equal, round, and reactive to light.  Neck:     Vascular: No carotid bruit or JVD.  Cardiovascular:     Rate and Rhythm: Normal rate and regular rhythm.     Heart sounds: Normal heart sounds.  Pulmonary:     Effort: Pulmonary effort is normal. No respiratory distress.     Breath sounds: Normal breath sounds. No  wheezing or rales.  Chest:     Chest wall: No tenderness.  Abdominal:     General: Bowel sounds are normal. There is no distension or abdominal bruit.     Palpations: Abdomen is soft. There is no hepatomegaly, splenomegaly, mass or pulsatile mass.     Tenderness: There is no abdominal tenderness.  Musculoskeletal:        General: Normal range of motion.     Cervical back: Normal range of motion and neck supple.  Lymphadenopathy:     Cervical: No cervical adenopathy.  Skin:    General: Skin is warm and dry.  Neurological:     Mental Status: She is alert and oriented to person, place, and time.     Deep Tendon Reflexes: Reflexes are normal and symmetric.  Psychiatric:        Behavior: Behavior normal.        Thought Content: Thought content normal.        Judgment: Judgment normal.    BP 135/75   Pulse 95   Temp (!) 97.2 F (36.2 C) (Temporal)   SpO2 96%    Hgba1c 6.4%      Assessment & Plan:   Madison Gentry comes in today with chief complaint of medical management of chronic issues    Diagnosis and orders addressed:  1. Primary hypertension Low sodium diet - CBC with Differential/Platelet - CMP14+EGFR - amLODipine (NORVASC) 10 MG tablet; Take 1 tablet (10 mg total) by mouth daily.  Dispense: 90 tablet; Refill: 1 - hydrochlorothiazide (HYDRODIURIL) 25 MG tablet; Take 1 tablet (25 mg total) by mouth daily.  Dispense: 90 tablet; Refill: 1 - lisinopril (ZESTRIL) 10 MG tablet; Take 1 tablet (10  mg total) by mouth daily.  Dispense: 90 tablet; Refill: 1  2. Hyperlipidemia associated with type 2 diabetes mellitus (HCC) Low fat diet Change zocor to crestor 20mg  daily - Lipid panel - ezetimibe (ZETIA) 10 MG tablet; Take 1 tablet (10 mg total) by mouth daily. TAKE 1 TABLET BY MOUTH ONCE DAILY  Dispense: 90 tablet; Refill: 1 - crestor 20 MG tablet; TAKE ONE TABLET BY MOUTH ONCE DAILY AT 6PM.  Dispense: 90 tablet; Refill: 1  3. Diabetes mellitus treated with oral medication  (HCC) Continue  to watch carbs - Bayer DCA Hb A1c Waived - Microalbumin / creatinine urine ratio - metFORMIN (GLUCOPHAGE) 500 MG tablet; Take 1 tablet (500 mg total) by mouth 2 (two) times daily with a meal.  Dispense: 180 tablet; Refill: 1 - Semaglutide, 2 MG/DOSE, (OZEMPIC, 2 MG/DOSE,) 8 MG/3ML SOPN; Inject 2 mg into the muscle once a week.  Dispense: 9 mL; Refill: 1  4. Atherosclerosis of native coronary artery of native heart with angina pectoris (HCC)  5. Mucopurulent chronic bronchitis (HCC) Continue inhalers  6. Gastroesophageal reflux disease without esophagitis Avoid spicy foods Do not eat 2 hours prior to bedtime  - omeprazole (PRILOSEC) 40 MG capsule; Take 1 capsule (40 mg total) by mouth daily.  Dispense: 90 capsule; Refill: 1  7. GAD (generalized anxiety disorder) Stress management - busPIRone (BUSPAR) 7.5 MG tablet; Take 1 tablet (7.5 mg total) by mouth daily.  Dispense: 90 tablet; Refill: 2  8. Recurrent major depressive disorder, in partial remission (HCC) - FLUoxetine (PROZAC) 40 MG capsule; Take 1 capsule (40 mg total) by mouth 2 (two) times daily.  Dispense: 180 capsule; Refill: 1  9. Primary insomnia Bedtime routine - zolpidem (AMBIEN) 10 MG tablet; Take 1 tablet (10 mg total) by mouth at bedtime.  Dispense: 30 tablet; Refill: 5  10. Stress incontinence of urine  11. Tobacco user Smoking cessation encouraged  12. Chronic midline low back pain without sciatica Keep follow up with pain management  13. Morbid obesity (HCC) Discussed diet and exercise for person with BMI >25 Will recheck weight in 3-6 months    Labs pending Health Maintenance reviewed Diet and exercise encouraged  Follow up plan: 6 months   Mary-Margaret Daphine Deutscher, FNP

## 2023-05-04 LAB — CBC WITH DIFFERENTIAL/PLATELET
Basophils Absolute: 0.1 10*3/uL (ref 0.0–0.2)
Basos: 1 %
EOS (ABSOLUTE): 0.5 10*3/uL — ABNORMAL HIGH (ref 0.0–0.4)
Eos: 3 %
Hematocrit: 39.8 % (ref 34.0–46.6)
Hemoglobin: 13.1 g/dL (ref 11.1–15.9)
Immature Grans (Abs): 0 10*3/uL (ref 0.0–0.1)
Immature Granulocytes: 0 %
Lymphocytes Absolute: 2.9 10*3/uL (ref 0.7–3.1)
Lymphs: 17 %
MCH: 29.4 pg (ref 26.6–33.0)
MCHC: 32.9 g/dL (ref 31.5–35.7)
MCV: 89 fL (ref 79–97)
Monocytes Absolute: 1 10*3/uL — ABNORMAL HIGH (ref 0.1–0.9)
Monocytes: 6 %
Neutrophils Absolute: 12.5 10*3/uL — ABNORMAL HIGH (ref 1.4–7.0)
Neutrophils: 73 %
Platelets: 458 10*3/uL — ABNORMAL HIGH (ref 150–450)
RBC: 4.45 x10E6/uL (ref 3.77–5.28)
RDW: 14.3 % (ref 11.7–15.4)
WBC: 17 10*3/uL — ABNORMAL HIGH (ref 3.4–10.8)

## 2023-05-04 LAB — CMP14+EGFR
ALT: 18 IU/L (ref 0–32)
AST: 17 IU/L (ref 0–40)
Albumin: 4.2 g/dL (ref 3.8–4.9)
Alkaline Phosphatase: 69 IU/L (ref 44–121)
BUN/Creatinine Ratio: 16 (ref 9–23)
BUN: 8 mg/dL (ref 6–24)
Bilirubin Total: <0.2 mg/dL (ref 0.0–1.2)
CO2: 22 mmol/L (ref 20–29)
Calcium: 9.5 mg/dL (ref 8.7–10.2)
Chloride: 103 mmol/L (ref 96–106)
Creatinine, Ser: 0.51 mg/dL — ABNORMAL LOW (ref 0.57–1.00)
Globulin, Total: 2.3 g/dL (ref 1.5–4.5)
Glucose: 131 mg/dL — ABNORMAL HIGH (ref 70–99)
Potassium: 3.8 mmol/L (ref 3.5–5.2)
Sodium: 142 mmol/L (ref 134–144)
Total Protein: 6.5 g/dL (ref 6.0–8.5)
eGFR: 110 mL/min/{1.73_m2} (ref >59)

## 2023-05-04 LAB — LIPID PANEL
Chol/HDL Ratio: 3 ratio (ref 0.0–4.4)
Cholesterol, Total: 147 mg/dL (ref 100–199)
HDL: 49 mg/dL (ref >39)
LDL Chol Calc (NIH): 75 mg/dL (ref 0–99)
Triglycerides: 130 mg/dL (ref 0–149)
VLDL Cholesterol Cal: 23 mg/dL (ref 5–40)

## 2023-05-06 NOTE — Telephone Encounter (Signed)
 Please verify pain management appointment prior to me doing any meds

## 2023-05-06 NOTE — Telephone Encounter (Signed)
 Patient states that she has been seeing same pain Dr for 2 years and that Dr advised her to take [facility] gummies to help with her pain. That Dr left the practice and they switched her to another provider. Her drug screen showed the THC gummies and the new provider told her she couldn't do that and referred her to another pain clinic.

## 2023-05-07 ENCOUNTER — Telehealth: Payer: Self-pay | Admitting: Nurse Practitioner

## 2023-05-07 NOTE — Telephone Encounter (Signed)
 She doesn't have an appointment yet. They are having to refer her to a whole new pain clinic. May be a wait

## 2023-05-07 NOTE — Telephone Encounter (Signed)
 Called and spoke with patient. Instructions given. Patient states that her drug screen will be clear. Appt made for Thursday with PCP

## 2023-05-07 NOTE — Telephone Encounter (Signed)
 I cannot give you anything without a clear drug screen. When is appointment  with pain management

## 2023-05-07 NOTE — Telephone Encounter (Signed)
 Copied from CRM [id number]. Topic: Clinical - Medication Question >> May 07, 2023  1:05 PM Gildardo Pounds wrote: Reason for CRM: Patient wants to know how high her cholesterol was for you to change her medication. Patient also wants know if she is to take both cholesterol medications and is she still supposed to take metFORMIN (GLUCOPHAGE) 500 MG tablet since she is also on sembastatin. Patient also inquiring about taking Semaglutide, 2 MG/DOSE, (OZEMPIC, 2 MG/DOSE,) 8 MG/3ML SOPN. Callback number is [phone]

## 2023-05-07 NOTE — Telephone Encounter (Signed)
 We stopped simvastatin and changed to crestor- crestor will work better- LDL are suppose to be below 70 and yours was 75- was 166 prior to that and have increase risk of stroke or heart attack. Yes suppose to stay on metformin Prescription was sent in for the wegovy- you are at maximum dose. As far as pain meds- I cannot right them without a cear drug screen.  Mary-Margaret Daphine Deutscher, FNP

## 2023-05-07 NOTE — Telephone Encounter (Signed)
 Copied from CRM [id number]. Topic: Clinical - Medication Refill >> May 07, 2023 12:59 PM Gildardo Pounds wrote: Most Recent Primary Care Visit:   Medication: HYDROcodone-acetaminophen (NORCO) 10-325 MG tablet  Has the patient contacted their pharmacy? No (Agent: If no, request that the patient contact the pharmacy for the refill. If patient does not wish to contact the pharmacy document the reason why and proceed with request.) (Agent: If yes, when and what did the pharmacy advise?)  Is this the correct pharmacy for this prescription? Yes If no, delete pharmacy and type the correct one.  This is the patient's preferred pharmacy:  [facility] - [place], [place] - [address] [place] [address] Phone: [phone] Fax: [phone]  Has the prescription been filled recently? No  Is the patient out of the medication? Yes  Has the patient been seen for an appointment in the last year OR does the patient have an upcoming appointment? Yes  Can we respond through MyChart? No  Agent: Please be advised that Rx refills may take up to 3 business days. We ask that you follow-up with your pharmacy.

## 2023-05-09 ENCOUNTER — Encounter: Payer: Self-pay | Admitting: Nurse Practitioner

## 2023-05-09 ENCOUNTER — Ambulatory Visit: Admitting: Nurse Practitioner

## 2023-05-09 VITALS — BP 135/78 | HR 95 | Temp 97.6°F | Ht 62.0 in | Wt 179.0 lb

## 2023-05-09 DIAGNOSIS — R52 Pain, unspecified: Secondary | ICD-10-CM

## 2023-05-09 MED ORDER — PREDNISONE 10 MG (21) PO TBPK
ORAL_TABLET | ORAL | 0 refills | Status: DC
Start: 2023-05-09 — End: 2023-10-23

## 2023-05-09 NOTE — Progress Notes (Signed)
 Subjective:    Patient ID: Madison Gentry, female    DOB: 04/09/1965, 58 y.o.   MRN: [id number]   Chief Complaint: Discuss pain meds   HPI  Patient has been seeing pain management for some time. Her pain management doctor left and she had to see  new doctor. Her old doctor told her she could take THC gummies. When she saw new doctor they refused to treat her because she had marijuana in her system. They are referring her to different pain management  ( [facility] ). She is here today wanting [place]  to fill pain meds until she can see pain management. Pain assessment: Cause of pain- chronic back pain Pain location- lower back Pain on scale of 1-10- 8-9/10 Frequency- daily What increases pain-nothing What makes pain Better-nothing Effects on ADL - none Any change in general medical condition-none  Current opioids rx- hydrocodone 10/325 4x a day # meds rx- 120 Effectiveness of current meds-helps Adverse reactions from pain meds-none Morphine equivalent-  Pill count performed-No Last drug screen - refuses drug screen today ( high risk q51m, moderate risk q68m, low risk yearly ) Urine drug screen today- No Was the NCCSR reviewed- yes  If yes were their any concerning findings? - no   Overdose risk: 1     No data to display           Pain contract signed on:  Patient Active Problem List   Diagnosis Date Noted   Primary insomnia 02/01/2017   Gastroesophageal reflux disease without esophagitis 02/01/2017   GAD (generalized anxiety disorder) 02/01/2017   Narcotic abuse (HCC) 01/13/2015   COPD (chronic obstructive pulmonary disease) (HCC) 06/15/2013   Back pain 06/15/2013   Urinary incontinence 06/15/2013   Hypertension 11/07/2012   Tobacco user 09/22/2012   Diabetes mellitus treated with oral medication (HCC) 09/22/2012   Hyperlipidemia associated with type 2 diabetes mellitus (HCC) 09/22/2012   Morbid obesity (HCC) 02/11/2011   Depression 02/11/2011   Coronary  atherosclerosis 01/18/2009       Review of Systems  Constitutional:  Negative for diaphoresis.  Eyes:  Negative for pain.  Respiratory:  Negative for shortness of breath.   Cardiovascular:  Negative for chest pain, palpitations and leg swelling.  Gastrointestinal:  Negative for abdominal pain.  Endocrine: Negative for polydipsia.  Skin:  Negative for rash.  Neurological:  Negative for dizziness, weakness and headaches.  Hematological:  Does not bruise/bleed easily.  All other systems reviewed and are negative.      Objective:   Physical Exam Constitutional:      Appearance: Normal appearance. She is obese.  Cardiovascular:     Rate and Rhythm: Normal rate and regular rhythm.     Heart sounds: Normal heart sounds.  Pulmonary:     Effort: Pulmonary effort is normal.     Breath sounds: Normal breath sounds.  Neurological:     General: No focal deficit present.     Mental Status: She is alert and oriented to person, place, and time.  Psychiatric:        Mood and Affect: Mood normal.        Behavior: Behavior normal.    BP 135/78   Pulse 95   Temp 97.6 F (36.4 C) (Temporal)   Ht 5\' 2"  (1.575 m)   Wt 179 lb (81.2 kg)   SpO2 99%   BMI 32.74 kg/m         Assessment & Plan:   Madison Gentry in today  with chief complaint of Discuss pain meds   1. Pain management (Primary) Long discussion with patient about pain meds. She can get appt with [facility] probably near the end of APril. She wanted meds to last her until  then. Was told office policy is she would have  to have negative drug screen on order for [place] to prescribe meds. Says that no point in doing drug screen because it will be positive for THC. Was told t come back when can do negative drug screen. Until the there is nothing I can do. Did agree to give her some steroids to help with pain.  Meds ordered this encounter  Medications   predniSONE (STERAPRED UNI-PAK 21 TAB) 10 MG (21) TBPK tablet    Sig: As directed x 6  days    Dispense:  21 tablet    Refill:  0    Supervising Provider:   Arville Care A [[id number]]     The above assessment and management plan was discussed with the patient. The patient verbalized understanding of and has agreed to the management plan. Patient is aware to call the clinic if symptoms persist or worsen. Patient is aware when to return to the clinic for a follow-up visit. Patient educated on when it is appropriate to go to the emergency department.   Madison Daphine Deutscher, FNP

## 2023-05-09 NOTE — Patient Instructions (Signed)
 Managing Chronic Pain This video reviews medicines and other treatment options used to manage chronic pain. You'll learn how to work with your health care provider to choose the best treatment plan for you. To view the content, go to this web address: [email or web address]  This video will expire on: 02/06/2025. If you need access to this video following this date, please reach out to the healthcare provider who assigned it to you. This information is not intended to replace advice given to you by your health care provider. Make sure you discuss any questions you have with your health care provider. Elsevier Patient Education  2024 [employer].

## 2023-06-18 ENCOUNTER — Ambulatory Visit

## 2023-07-02 ENCOUNTER — Encounter: Payer: Self-pay | Admitting: Nurse Practitioner

## 2023-08-02 ENCOUNTER — Telehealth: Payer: Self-pay | Admitting: Family Medicine

## 2023-08-02 NOTE — Telephone Encounter (Signed)
 Called to schedule patient Madison Gentry exam

## 2023-08-09 ENCOUNTER — Telehealth: Payer: Self-pay

## 2023-08-09 NOTE — Telephone Encounter (Signed)
 Pharmacy Patient Advocate Encounter   Received notification from CoverMyMeds that prior authorization for Zolpidem  Tartrate 10MG  tablets is required/requested.   Insurance verification completed.   The patient is insured through [facility] .   Per test claim: PA required; PA submitted to above mentioned insurance via CoverMyMeds Key/confirmation #/EOC [id number] Status is pending

## 2023-08-14 ENCOUNTER — Other Ambulatory Visit (HOSPITAL_COMMUNITY): Payer: Self-pay

## 2023-08-14 NOTE — Telephone Encounter (Signed)
 Pharmacy Patient Advocate Encounter  Received notification from [facility] that Prior Authorization for Zolpidem  Tartrate 10MG  tablets has been APPROVED from 08/09/2023 to 02/08/2024 with QUANTITY LIMIT of 15 for a 30 day supply.   PA #/Case ID/Reference #: [id number]

## 2023-09-20 ENCOUNTER — Telehealth: Payer: Self-pay | Admitting: Family Medicine

## 2023-09-20 NOTE — Telephone Encounter (Signed)
 Called patient and told her she would need to be seen and states she needed MMM to call inRX for sling and told her she had to be seen. She said ok.Didn't make appt.

## 2023-09-20 NOTE — Telephone Encounter (Signed)
 Will need an appointment to get a prescription from here  Please offer appt [facility] has night clinic available   Copied from CRM [id number]. Topic: Appointments - Appointment Scheduling >> Sep 20, 2023 10:02 AM Madison Gentry wrote: Patient needs script for a sling - for the right arm.. [facility].  Can we send in script?  No appt available til 8/5?

## 2023-10-14 ENCOUNTER — Other Ambulatory Visit: Payer: Self-pay | Admitting: Nurse Practitioner

## 2023-10-14 DIAGNOSIS — F3341 Major depressive disorder, recurrent, in partial remission: Secondary | ICD-10-CM

## 2023-10-19 ENCOUNTER — Emergency Department (HOSPITAL_COMMUNITY)
Admission: EM | Admit: 2023-10-19 | Discharge: 2023-10-19 | Disposition: A | Discharge status: Home / Self Care | Attending: Student | Admitting: Student

## 2023-10-19 ENCOUNTER — Encounter (HOSPITAL_COMMUNITY): Payer: Self-pay | Admitting: Emergency Medicine

## 2023-10-19 ENCOUNTER — Other Ambulatory Visit: Payer: Self-pay

## 2023-10-19 ENCOUNTER — Emergency Department (HOSPITAL_COMMUNITY)

## 2023-10-19 DIAGNOSIS — Z7982 Long term (current) use of aspirin: Secondary | ICD-10-CM | POA: Insufficient documentation

## 2023-10-19 DIAGNOSIS — M545 Low back pain, unspecified: Secondary | ICD-10-CM | POA: Insufficient documentation

## 2023-10-19 DIAGNOSIS — I10 Essential (primary) hypertension: Secondary | ICD-10-CM | POA: Diagnosis not present

## 2023-10-19 DIAGNOSIS — Z79899 Other long term (current) drug therapy: Secondary | ICD-10-CM | POA: Diagnosis not present

## 2023-10-19 DIAGNOSIS — Y92410 Unspecified street and highway as the place of occurrence of the external cause: Secondary | ICD-10-CM | POA: Diagnosis not present

## 2023-10-19 DIAGNOSIS — R079 Chest pain, unspecified: Secondary | ICD-10-CM | POA: Insufficient documentation

## 2023-10-19 MED ORDER — METHYLPREDNISOLONE 4 MG PO TBPK
ORAL_TABLET | Freq: Every day | ORAL | 0 refills | Status: DC
Start: 2023-10-19 — End: 2023-10-25

## 2023-10-19 MED ORDER — LIDOCAINE 5 % EX PTCH
1.0000 | MEDICATED_PATCH | CUTANEOUS | Status: DC
Start: 2023-10-19 — End: 2023-10-20
  Administered 2023-10-19: 1 via TRANSDERMAL
  Filled 2023-10-19: qty 1

## 2023-10-19 MED ORDER — HYDROCODONE-ACETAMINOPHEN 5-325 MG PO TABS
1.0000 | ORAL_TABLET | Freq: Once | ORAL | Status: AC
  Administered 2023-10-19: 1 via ORAL
  Filled 2023-10-19: qty 1

## 2023-10-19 MED ORDER — LIDOCAINE 5 % EX PTCH: 1.0000 | MEDICATED_PATCH | CUTANEOUS | 0 refills | Status: AC

## 2023-10-19 MED ORDER — METHOCARBAMOL 500 MG PO TABS
500.0000 mg | ORAL_TABLET | Freq: Once | ORAL | Status: AC
  Administered 2023-10-19: 500 mg via ORAL
  Filled 2023-10-19: qty 1

## 2023-10-19 NOTE — Discharge Instructions (Signed)
 I would recommend taking anti-inflammatories and Tylenol .  Take baclofen  for muscle spasm.  Take your home pain medicine for breakthrough pain.  Use lidocaine  patch.  Use ice and heat over area of pain.  Follow-up with primary care.

## 2023-10-19 NOTE — ED Notes (Signed)
 Patient currently in xray ?
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
## 2023-10-21 ENCOUNTER — Ambulatory Visit: Payer: Self-pay
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
## 2023-10-21 NOTE — Telephone Encounter (Signed)
 noted

## 2023-10-23 ENCOUNTER — Encounter: Payer: Self-pay | Admitting: Family Medicine

## 2023-10-23 ENCOUNTER — Ambulatory Visit: Admitting: Family Medicine

## 2023-10-23 VITALS — BP 124/80 | HR 100 | Temp 97.7°F | Ht 62.0 in | Wt 184.4 lb

## 2023-10-23 DIAGNOSIS — M47816 Spondylosis without myelopathy or radiculopathy, lumbar region: Secondary | ICD-10-CM | POA: Diagnosis not present

## 2023-10-23 MED ORDER — PREDNISONE 10 MG PO TABS: ORAL_TABLET | ORAL | 0 refills | Status: DC

## 2023-10-23 MED ORDER — BETAMETHASONE SOD PHOS & ACET 6 (3-3) MG/ML IJ SUSP
6.0000 mg | Freq: Once | INTRAMUSCULAR | Status: AC
  Administered 2023-10-23: 6 mg via INTRAMUSCULAR
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
## 2023-11-01 ENCOUNTER — Telehealth: Admitting: Nurse Practitioner

## 2023-11-03 ENCOUNTER — Other Ambulatory Visit: Payer: Self-pay | Admitting: Nurse Practitioner

## 2023-11-03 DIAGNOSIS — F5101 Primary insomnia: Secondary | ICD-10-CM

## 2023-11-11 ENCOUNTER — Other Ambulatory Visit: Payer: Self-pay | Admitting: Nurse Practitioner

## 2023-11-11 DIAGNOSIS — F5101 Primary insomnia: Secondary | ICD-10-CM

## 2023-11-12 ENCOUNTER — Other Ambulatory Visit: Payer: Self-pay | Admitting: Nurse Practitioner

## 2023-11-12 ENCOUNTER — Ambulatory Visit (INDEPENDENT_AMBULATORY_CARE_PROVIDER_SITE_OTHER): Admitting: Nurse Practitioner

## 2023-11-12 ENCOUNTER — Encounter: Payer: Self-pay | Admitting: Nurse Practitioner

## 2023-11-12 VITALS — BP 110/67 | HR 88 | Temp 97.8°F | Ht 62.0 in | Wt 184.0 lb

## 2023-11-12 DIAGNOSIS — J411 Mucopurulent chronic bronchitis: Secondary | ICD-10-CM

## 2023-11-12 DIAGNOSIS — F5101 Primary insomnia: Secondary | ICD-10-CM

## 2023-11-12 DIAGNOSIS — F411 Generalized anxiety disorder: Secondary | ICD-10-CM

## 2023-11-12 DIAGNOSIS — K219 Gastro-esophageal reflux disease without esophagitis: Secondary | ICD-10-CM | POA: Diagnosis not present

## 2023-11-12 DIAGNOSIS — E119 Type 2 diabetes mellitus without complications: Secondary | ICD-10-CM | POA: Diagnosis not present

## 2023-11-12 DIAGNOSIS — Z720 Tobacco use: Secondary | ICD-10-CM

## 2023-11-12 DIAGNOSIS — E1169 Type 2 diabetes mellitus with other specified complication: Secondary | ICD-10-CM

## 2023-11-12 DIAGNOSIS — I10 Essential (primary) hypertension: Secondary | ICD-10-CM | POA: Diagnosis not present

## 2023-11-12 DIAGNOSIS — F3341 Major depressive disorder, recurrent, in partial remission: Secondary | ICD-10-CM

## 2023-11-12 DIAGNOSIS — Z7984 Long term (current) use of oral hypoglycemic drugs: Secondary | ICD-10-CM

## 2023-11-12 DIAGNOSIS — E785 Hyperlipidemia, unspecified: Secondary | ICD-10-CM

## 2023-11-12 LAB — LIPID PANEL

## 2023-11-12 LAB — BAYER DCA HB A1C WAIVED: HB A1C (BAYER DCA - WAIVED): 7.4 % — ABNORMAL HIGH (ref 4.8–5.6)

## 2023-11-12 MED ORDER — HYDROCHLOROTHIAZIDE 25 MG PO TABS: 25.0000 mg | ORAL_TABLET | Freq: Every day | ORAL | 1 refills | Status: DC

## 2023-11-12 MED ORDER — METFORMIN HCL 500 MG PO TABS: 500.0000 mg | ORAL_TABLET | Freq: Two times a day (BID) | ORAL | 1 refills | Status: DC

## 2023-11-12 MED ORDER — LISINOPRIL 10 MG PO TABS: 10.0000 mg | ORAL_TABLET | Freq: Every day | ORAL | 1 refills | Status: DC

## 2023-11-12 MED ORDER — FLUOXETINE HCL 40 MG PO CAPS: 40.0000 mg | ORAL_CAPSULE | Freq: Two times a day (BID) | ORAL | 0 refills | Status: DC

## 2023-11-12 MED ORDER — OMEPRAZOLE 40 MG PO CPDR: 40.0000 mg | DELAYED_RELEASE_CAPSULE | Freq: Every day | ORAL | 1 refills | Status: DC

## 2023-11-12 MED ORDER — ROSUVASTATIN CALCIUM 20 MG PO TABS: 20.0000 mg | ORAL_TABLET | Freq: Every day | ORAL | 1 refills | Status: DC

## 2023-11-12 MED ORDER — ZOLPIDEM TARTRATE 10 MG PO TABS: 10.0000 mg | ORAL_TABLET | Freq: Every day | ORAL | 5 refills | Status: AC

## 2023-11-12 MED ORDER — BUSPIRONE HCL 7.5 MG PO TABS: 7.5000 mg | ORAL_TABLET | Freq: Every day | ORAL | 2 refills | Status: DC

## 2023-11-12 MED ORDER — AMLODIPINE BESYLATE 10 MG PO TABS: 10.0000 mg | ORAL_TABLET | Freq: Every day | ORAL | 1 refills | Status: DC

## 2023-11-12 MED ORDER — OZEMPIC (0.25 OR 0.5 MG/DOSE) 2 MG/3ML ~~LOC~~ SOPN: 0.5000 mg | PEN_INJECTOR | SUBCUTANEOUS | 2 refills | Status: DC

## 2023-11-12 MED ORDER — EZETIMIBE 10 MG PO TABS: 10.0000 mg | ORAL_TABLET | Freq: Every day | ORAL | 1 refills | Status: DC

## 2023-11-12 NOTE — Progress Notes (Signed)
 Subjective:    Patient ID: Madison Gentry, female    DOB: Mar 19, 1965, 58 y.o.   MRN: [id number]   Chief Complaint: medical management of chronic issues     HPI:  Madison Gentry is a 58 y.o. who identifies as a female who was assigned female at birth.   Social history: Lives with: husband Work history: disability   Comes in today for follow up of the following chronic medical issues:  1. Primary hypertension No c/o chest pain, sob or headache. Does not check blood pressure at home. BP Readings from Last 3 Encounters:  11/12/23 110/67  10/23/23 124/80  10/19/23 112/78     2. Hyperlipidemia associated with type 2 diabetes mellitus (HCC) Does not watch diet and does no dedicated exercise. She is on zocor  and zetia . Lab Results  Component Value Date   CHOL 147 05/03/2023   HDL 49 05/03/2023   LDLCALC 75 05/03/2023   TRIG 130 05/03/2023   CHOLHDL 3.0 05/03/2023  The ASCVD Risk score (Arnett DK, et al., 2019) failed to calculate for the following reasons:   Risk score cannot be calculated because patient has a medical history suggesting prior/existing ASCVD    3. Diabetes mellitus treated with oral medication (HCC) She does not check her blood sugars very often. Forgot ozempic  and when she tried it again she had severe nausea. needs  to go back to lower dose. Lab Results  Component Value Date   HGBA1C 6.4 (H) 05/03/2023    4. Atherosclerosis of native coronary artery of native heart with angina pectoris (HCC) No chest pain. Has not seen cardiology  5. Mucopurulent chronic bronchitis (HCC) Is on symbicort  daily. Has a frequent cough  6. Gastroesophageal reflux disease without esophagitis Is on omeprazole  and is doing well.  7. GAD (generalized anxiety disorder) Is on buspar  BID and is doing well.    10/23/2023    9:43 AM 05/03/2023   10:03 AM 03/29/2023    9:25 AM 10/19/2022   11:08 AM  GAD 7 : Generalized Anxiety Score  Nervous, Anxious, on Edge 3 2 3 3   Control/stop  worrying 3 2 3 1   Worry too much - different things 3 2 3 1   Trouble relaxing 3 2 0 3  Restless 0 0 0 1  Easily annoyed or irritable 3 3 3 3   Afraid - awful might happen 0 1 1 0  Total GAD 7 Score 15 12 13 12   Anxiety Difficulty Extremely difficult Extremely difficult Extremely difficult Not difficult at all        8. Recurrent major depressive disorder, in partial remission (HCC) Has been on prozac  for several years and is doing well.    10/23/2023    9:42 AM 10/23/2023    9:22 AM 05/03/2023   10:03 AM  Depression screen PHQ 2/9  Decreased Interest 3 0 1  Down, Depressed, Hopeless 2 0 2  PHQ - 2 Score 5 0 3  Altered sleeping 2  3  Tired, decreased energy 3  3  Change in appetite 0  1  Feeling bad or failure about yourself  0  0  Trouble concentrating 0  2  Moving slowly or fidgety/restless 0  1  Suicidal thoughts 0  0  PHQ-9 Score 10  13  Difficult doing work/chores Extremely dIfficult  Extremely dIfficult      9. Primary insomnia Is on ambien  and sleeps about 7-8 hours a night.  10. Stress incontinence of urine Leaks urine and has  to wear pads and depends al the time.  11. Tobacco user Still smokes over a pack a day.  12. Chronic midline low back pain without sciatica Pain assessment: Goes to pain management    13. Morbid obesity (HCC) Weight is down 13lbs  Wt Readings from Last 3 Encounters:  11/12/23 184 lb (83.5 kg)  10/23/23 184 lb 6.4 oz (83.6 kg)  05/09/23 179 lb (81.2 kg)   BMI Readings from Last 3 Encounters:  11/12/23 33.65 kg/m  10/23/23 33.73 kg/m  05/09/23 32.74 kg/m      New complaints: None today  Allergies  Allergen Reactions   Butrans [Buprenorphine] Other (See Comments)    Skin irritation   Outpatient Encounter Medications as of 11/12/2023  Medication Sig   ACCU-CHEK GUIDE test strip USE 1 UP TO 4 TIMES DAILY AS DIRECTED   Accu-Chek Softclix Lancets lancets Use as directed up to 4 times daily as directed Dx E11.9    albuterol  (VENTOLIN  HFA) 108 (90 Base) MCG/ACT inhaler Inhale 2 puffs into the lungs every 6 (six) hours as needed for wheezing or shortness of breath.   amLODipine  (NORVASC ) 10 MG tablet Take 1 tablet (10 mg total) by mouth daily.   Aspirin -Acetaminophen -Caffeine 520-260-32.5 MG PACK Take 1 packet by mouth 2 (two) times daily as needed. For pain or headache   baclofen  (LIORESAL ) 10 MG tablet Take 1 tablet (10 mg total) by mouth 3 (three) times daily.   Blood Glucose Monitoring Suppl (ACCU-CHEK GUIDE ME) w/Device KIT USE AS DIRECTED UP TO 4 TIMES DAILY AS DIRECTED Dx E11.9   busPIRone  (BUSPAR ) 7.5 MG tablet Take 1 tablet (7.5 mg total) by mouth daily.   cyclobenzaprine  (FLEXERIL ) 10 MG tablet Take 1 tablet (10 mg total) by mouth 3 (three) times daily.   diclofenac  Sodium (VOLTAREN ) 1 % GEL Apply 2 g topically 4 (four) times daily as needed (pain).   ezetimibe  (ZETIA ) 10 MG tablet Take 1 tablet (10 mg total) by mouth daily. TAKE 1 TABLET BY MOUTH ONCE DAILY   FLUoxetine  (PROZAC ) 40 MG capsule TAKE ONE CAPSULE BY MOUTH 2 TIMES A DAY   hydrochlorothiazide  (HYDRODIURIL ) 25 MG tablet Take 1 tablet (25 mg total) by mouth daily.   HYDROcodone -acetaminophen  (NORCO) 10-325 MG tablet Take 1 tablet by mouth every 6 (six) hours.   ibuprofen  (ADVIL ,MOTRIN ) 200 MG tablet Take 200 mg by mouth every 6 (six) hours as needed. Takes 800 mg when needed for menstrual cramping   Incontinence Supplies KIT Patient needs Pull ups, chuck pads and wipes.   lidocaine  (LIDODERM ) 5 % Place 1 patch onto the skin daily. Remove & Discard patch within 12 hours or as directed by MD   lisinopril  (ZESTRIL ) 10 MG tablet Take 1 tablet (10 mg total) by mouth daily.   metFORMIN  (GLUCOPHAGE ) 500 MG tablet Take 1 tablet (500 mg total) by mouth 2 (two) times daily with a meal.   naproxen (NAPROSYN) 500 MG tablet Take 500 mg by mouth 2 (two) times daily as needed.   nitroGLYCERIN  (NITROSTAT ) 0.4 MG SL tablet DISSOLVE ONE TABLET UNDER THE  TONGUE EVERY 5 MINUTES AS NEEDED FOR CHEST PAIN.  DO NOT EXCEED A TOTAL OF 3 DOSES IN 15 MINUTES (Patient taking differently: No sig reported)   omeprazole  (PRILOSEC) 40 MG capsule Take 1 capsule (40 mg total) by mouth daily.   rosuvastatin  (CRESTOR ) 20 MG tablet Take 1 tablet (20 mg total) by mouth daily.   Semaglutide , 2 MG/DOSE, (OZEMPIC , 2 MG/DOSE,) 8 MG/3ML SOPN Inject  2 mg into the muscle once a week.   silver sulfADIAZINE (SILVADENE) 1 % cream Apply 1 application  topically 2 (two) times daily as needed (rash/skin irritation).   simvastatin  (ZOCOR ) 40 MG tablet Take 40 mg by mouth daily at 6 PM.   zolpidem  (AMBIEN ) 10 MG tablet Take 1 tablet (10 mg total) by mouth at bedtime.   [DISCONTINUED] predniSONE  (DELTASONE ) 10 MG tablet Take 5 daily for 3 days followed by 4,3,2 and 1 for 3 days each.   No facility-administered encounter medications on file as of 11/12/2023.    Past Surgical History:  Procedure Laterality Date   TUBAL LIGATION      Family History  Problem Relation Age of Onset   CAD Mother 43   CAD Father 21       Died age 35   CAD Brother 87      Controlled substance contract: n/a     Review of Systems  Constitutional:  Negative for diaphoresis.  Eyes:  Negative for pain.  Respiratory:  Negative for shortness of breath.   Cardiovascular:  Negative for chest pain, palpitations and leg swelling.  Gastrointestinal:  Negative for abdominal pain.  Endocrine: Negative for polydipsia.  Skin:  Negative for rash.  Neurological:  Negative for dizziness, weakness and headaches.  Hematological:  Does not bruise/bleed easily.  All other systems reviewed and are negative.      Objective:   Physical Exam Vitals and nursing note reviewed.  Constitutional:      General: She is not in acute distress.    Appearance: Normal appearance. She is well-developed.  HENT:     Head: Normocephalic.     Right Ear: Tympanic membrane normal.     Left Ear: Tympanic membrane  normal.     Nose: Nose normal.     Mouth/Throat:     Mouth: Mucous membranes are moist.  Eyes:     Pupils: Pupils are equal, round, and reactive to light.  Neck:     Vascular: No carotid bruit or JVD.  Cardiovascular:     Rate and Rhythm: Normal rate and regular rhythm.     Heart sounds: Normal heart sounds.  Pulmonary:     Effort: Pulmonary effort is normal. No respiratory distress.     Breath sounds: Normal breath sounds. No wheezing or rales.  Chest:     Chest wall: No tenderness.  Abdominal:     General: Bowel sounds are normal. There is no distension or abdominal bruit.     Palpations: Abdomen is soft. There is no hepatomegaly, splenomegaly, mass or pulsatile mass.     Tenderness: There is no abdominal tenderness.  Musculoskeletal:        General: Normal range of motion.     Cervical back: Normal range of motion and neck supple.  Lymphadenopathy:     Cervical: No cervical adenopathy.  Skin:    General: Skin is warm and dry.  Neurological:     Mental Status: She is alert and oriented to person, place, and time.     Deep Tendon Reflexes: Reflexes are normal and symmetric.  Psychiatric:        Behavior: Behavior normal.        Thought Content: Thought content normal.        Judgment: Judgment normal.    BP 110/67   Pulse 88   Temp 97.8 F (36.6 C) (Temporal)   Ht 5' 2 (1.575 m)   Wt 184 lb (83.5 kg)  SpO2 96%   BMI 33.65 kg/m    Hgba1c 6.4%      Assessment & Plan:   Murle Hellstrom comes in today with chief complaint of medical management of chronic issues    Diagnosis and orders addressed:  1. Primary hypertension Low sodium diet - CBC with Differential/Platelet - CMP14+EGFR - amLODipine  (NORVASC ) 10 MG tablet; Take 1 tablet (10 mg total) by mouth daily.  Dispense: 90 tablet; Refill: 1 - hydrochlorothiazide  (HYDRODIURIL ) 25 MG tablet; Take 1 tablet (25 mg total) by mouth daily.  Dispense: 90 tablet; Refill: 1 - lisinopril  (ZESTRIL ) 10 MG tablet; Take 1  tablet (10 mg total) by mouth daily.  Dispense: 90 tablet; Refill: 1  2. Hyperlipidemia associated with type 2 diabetes mellitus (HCC) Low fat diet Change zocor  to crestor  20mg  daily - Lipid panel - ezetimibe  (ZETIA ) 10 MG tablet; Take 1 tablet (10 mg total) by mouth daily. TAKE 1 TABLET BY MOUTH ONCE DAILY  Dispense: 90 tablet; Refill: 1 - crestor  20 MG tablet; TAKE ONE TABLET BY MOUTH ONCE DAILY AT 6PM.  Dispense: 90 tablet; Refill: 1  3. Diabetes mellitus treated with oral medication (HCC) Continue  to watch carbs Decreased ozempic  to 0.5mg  weekly - Bayer DCA Hb A1c Waived - Microalbumin / creatinine urine ratio - metFORMIN  (GLUCOPHAGE ) 500 MG tablet; Take 1 tablet (500 mg total) by mouth 2 (two) times daily with a meal.  Dispense: 180 tablet; Refill: 1 - Semaglutide , 0.5 MG/DOSE, (OZEMPIC , 0.5 MG/DOSE,) 8 MG/3ML SOPN; Inject 2 mg into the muscle once a week.  Dispense: 9 mL; Refill: 1  4. Atherosclerosis of native coronary artery of native heart with angina pectoris (HCC)  5. Mucopurulent chronic bronchitis (HCC) Continue inhalers  6. Gastroesophageal reflux disease without esophagitis Avoid spicy foods Do not eat 2 hours prior to bedtime  - omeprazole  (PRILOSEC) 40 MG capsule; Take 1 capsule (40 mg total) by mouth daily.  Dispense: 90 capsule; Refill: 1  7. GAD (generalized anxiety disorder) Stress management - busPIRone  (BUSPAR ) 7.5 MG tablet; Take 1 tablet (7.5 mg total) by mouth daily.  Dispense: 90 tablet; Refill: 2  8. Recurrent major depressive disorder, in partial remission (HCC) - FLUoxetine  (PROZAC ) 40 MG capsule; Take 1 capsule (40 mg total) by mouth 2 (two) times daily.  Dispense: 180 capsule; Refill: 1  9. Primary insomnia Bedtime routine - zolpidem  (AMBIEN ) 10 MG tablet; Take 1 tablet (10 mg total) by mouth at bedtime.  Dispense: 30 tablet; Refill: 5  10. Stress incontinence of urine  11. Tobacco user Smoking cessation encouraged  12. Chronic midline low  back pain without sciatica Keep follow up with pain management  13. Morbid obesity (HCC) Discussed diet and exercise for person with BMI >25 Will recheck weight in 3-6 months    Labs pending Health Maintenance reviewed Diet and exercise encouraged  Follow up plan: 6 months   Mary-Margaret Gladis, FNP

## 2023-11-12 NOTE — Patient Instructions (Signed)

## 2023-11-13 ENCOUNTER — Ambulatory Visit: Payer: Self-pay | Admitting: Nurse Practitioner

## 2023-11-13 LAB — CMP14+EGFR
ALT: 16 IU/L (ref 0–32)
AST: 13 IU/L (ref 0–40)
Albumin: 3.9 g/dL (ref 3.8–4.9)
Alkaline Phosphatase: 48 IU/L — AB (ref 49–135)
BUN/Creatinine Ratio: 23 (ref 9–23)
BUN: 11 mg/dL (ref 6–24)
Bilirubin Total: <0.2 mg/dL (ref 0.0–1.2)
CO2: 22 mmol/L (ref 20–29)
Calcium: 9.1 mg/dL (ref 8.7–10.2)
Chloride: 100 mmol/L (ref 96–106)
Creatinine, Ser: 0.48 mg/dL — AB (ref 0.57–1.00)
Globulin, Total: 2 g/dL (ref 1.5–4.5)
Glucose: 181 mg/dL — AB (ref 70–99)
Potassium: 4.5 mmol/L (ref 3.5–5.2)
Sodium: 137 mmol/L (ref 134–144)
Total Protein: 5.9 g/dL — AB (ref 6.0–8.5)
eGFR: 111 mL/min/1.73 (ref >59)

## 2023-11-13 LAB — LIPID PANEL
Cholesterol, Total: 184 mg/dL (ref 100–199)
HDL: 81 mg/dL (ref >39)
LDL CALC COMMENT:: 2.3 ratio (ref 0.0–4.4)
LDL Chol Calc (NIH): 86 mg/dL (ref 0–99)
Triglycerides: 97 mg/dL (ref 0–149)
VLDL Cholesterol Cal: 17 mg/dL (ref 5–40)

## 2023-11-13 LAB — CBC WITH DIFFERENTIAL/PLATELET
Basophils Absolute: 0.1 x10E3/uL (ref 0.0–0.2)
Basos: 1 %
EOS (ABSOLUTE): 0.1 x10E3/uL (ref 0.0–0.4)
Eos: 1 %
Hematocrit: 35.3 % (ref 34.0–46.6)
Hemoglobin: 10.6 g/dL — ABNORMAL LOW (ref 11.1–15.9)
Immature Grans (Abs): 0.1 x10E3/uL (ref 0.0–0.1)
Immature Granulocytes: 1 %
Lymphocytes Absolute: 2.5 x10E3/uL (ref 0.7–3.1)
Lymphs: 20 %
MCH: 25.4 pg — ABNORMAL LOW (ref 26.6–33.0)
MCHC: 30 g/dL — ABNORMAL LOW (ref 31.5–35.7)
MCV: 85 fL (ref 79–97)
Monocytes Absolute: 0.6 x10E3/uL (ref 0.1–0.9)
Monocytes: 5 %
Neutrophils Absolute: 9.6 x10E3/uL — ABNORMAL HIGH (ref 1.4–7.0)
Neutrophils: 72 %
Platelets: 390 x10E3/uL (ref 150–450)
RBC: 4.17 x10E6/uL (ref 3.77–5.28)
RDW: 14.9 % (ref 11.7–15.4)
WBC: 12.9 x10E3/uL — ABNORMAL HIGH (ref 3.4–10.8)

## 2023-11-20 ENCOUNTER — Ambulatory Visit: Payer: Self-pay

## 2023-11-20 NOTE — Telephone Encounter (Signed)
 Apt scheduled.

## 2023-11-20 NOTE — Telephone Encounter (Signed)
 FYI Only or Action Required?: FYI only for provider.  Patient was last seen in primary care on 11/12/2023 by Gladis Mustard, FNP.  Called Nurse Triage reporting Dizziness.  Symptoms began several weeks ago.  Interventions attempted: Nothing.  Symptoms are: gradually worsening.  Triage Disposition: See Physician Within 24 Hours  Patient/caregiver understands and will follow disposition?:      Copied from CRM #[id number]. Topic: Clinical - Red Word Triage >> Nov 20, 2023  1:56 PM Gustabo D wrote: Pt has been having dizzy spells for a couple weeks. Felt like she was going to pass out.   Called to get lab results and setup app I declined scheduling do to what she stated above Reason for Disposition  [1] MODERATE dizziness (e.g., interferes with normal activities) AND [2] has NOT been evaluated by doctor (or NP/PA) for this  (Exception: Dizziness caused by heat exposure, sudden standing, or poor fluid intake.)  Answer Assessment - Initial Assessment Questions Patient states her dizziness is moderate in nature and she feels exhausted. Patient denies denies numbness or weakness. Patient also called requesting information about her lab results. This RN read verbatim Wbc better, Hgb low- daily iron supplement- recheck in 2 weeks, Blood sugar elevated, Kidney and liver function stable, Hgba1c (7.4) discussed at appointment, Cholesterol looks good. Patient verbalized understanding of lab results.      1. DESCRIPTION: Describe your dizziness.     Feels very tired and sleepy lately  2. LIGHTHEADED: Do you feel lightheaded? (e.g., somewhat faint, woozy, weak upon standing)     Denies feeling lightheaded  3. VERTIGO: Do you feel like either you or the room is spinning or tilting? (i.e., vertigo)     Denies  4. SEVERITY: How bad is it?  Do you feel like you are going to faint? Can you stand and walk?     Can walk and stand when symptoms occur  5. ONSET:  When did the  dizziness begin?     A couple of weeks  6. AGGRAVATING FACTORS: Does anything make it worse? (e.g., standing, change in head position)     When doing housework  7. HEART RATE: Can you tell me your heart rate? How many beats in 15 seconds?  (Note: Not all patients can do this.)       At times she has heart racing sensation  8. RECURRENT SYMPTOM: Have you had dizziness before? If Yes, ask: When was the last time? What happened that time?     No  9. OTHER SYMPTOMS: Do you have any other symptoms? (e.g., fever, chest pain, vomiting, diarrhea, bleeding)       Chest cramping/ burning in throat on Saturday but states symptoms resolved after taking Tums.  Protocols used: Dizziness - Lightheadedness-A-AH

## 2023-11-21 ENCOUNTER — Ambulatory Visit (INDEPENDENT_AMBULATORY_CARE_PROVIDER_SITE_OTHER): Admitting: Family Medicine

## 2023-11-21 ENCOUNTER — Encounter: Payer: Self-pay | Admitting: Family Medicine

## 2023-11-21 VITALS — BP 123/70 | HR 105 | Temp 98.1°F | Ht 62.0 in | Wt 185.0 lb

## 2023-11-21 DIAGNOSIS — D649 Anemia, unspecified: Secondary | ICD-10-CM

## 2023-11-21 DIAGNOSIS — R5381 Other malaise: Secondary | ICD-10-CM | POA: Diagnosis not present

## 2023-11-21 DIAGNOSIS — M545 Low back pain, unspecified: Secondary | ICD-10-CM

## 2023-11-21 DIAGNOSIS — R5383 Other fatigue: Secondary | ICD-10-CM

## 2023-11-21 DIAGNOSIS — R195 Other fecal abnormalities: Secondary | ICD-10-CM | POA: Diagnosis not present

## 2023-11-21 DIAGNOSIS — G8929 Other chronic pain: Secondary | ICD-10-CM

## 2023-11-21 NOTE — Progress Notes (Signed)
 Subjective:  Patient ID: Madison Gentry, female    DOB: 03/01/1965, 58 y.o.   MRN: [id number]  Patient Care Team: Gladis Mustard, FNP as PCP - General (Family Medicine) Gladis Mustard, FNP as Nurse Practitioner (Nurse Practitioner) Center, [facility] (Obstetrics and Gynecology)   Chief Complaint:  Dizziness ((For about 2 weeks)), Anxiety ((For about 2 weeks)), and Fatigue ((For about 2 weeks))   HPI: Madison Gentry is a 58 y.o. female presenting on 11/21/2023 for Dizziness ((For about 2 weeks)), Anxiety ((For about 2 weeks)), and Fatigue ((For about 2 weeks))   Madison Gentry is a 58 year old female who presents with dizziness, fatigue, and nervousness following abnormal lab results.  She was called in due to concerning lab results, specifically low hemoglobin and elevated blood sugar. She has not yet started the iron supplement that was recommended.  She experiences dizziness, nervousness, lack of energy, and persistent sleepiness, which have worsened since her last visit on November 12, 2023. No heart palpitations or shortness of breath, but she feels fatigued and anxious.  She occasionally experiences dark stools, with the last occurrence a couple of weeks ago, prior to starting any iron supplements. She reports minor bruising on her arms from bumps and scratches from her animals.  She has been off prednisone  for a couple of weeks, which she previously took for over a month. She associates the onset of her fatigue with the discontinuation of prednisone . She also takes a muscle relaxer with her pain medication.          Relevant past medical, surgical, family, and social history reviewed and updated as indicated.  Allergies and medications reviewed and updated. Data reviewed: Chart in Epic.   Past Medical History:  Diagnosis Date   CAD (coronary artery disease)    COPD (chronic obstructive pulmonary disease) (HCC)    Diabetes (HCC)    Hypertension    Stroke [facility])      Past Surgical History:  Procedure Laterality Date   TUBAL LIGATION      Social History   Socioeconomic History   Marital status: Single    Spouse name: Not on file   Number of children: 3   Years of education: Not on file   Highest education level: Not on file  Occupational History   Not on file  Tobacco Use   Smoking status: Every Day    Current packs/day: 1.50    Average packs/day: 1.5 packs/day for 28.0 years (42.0 ttl pk-yrs)    Types: Cigarettes   Smokeless tobacco: Never  Substance and Sexual Activity   Alcohol use: Yes    Comment: occas   Drug use: No   Sexual activity: Yes    Birth control/protection: None  Other Topics Concern   Not on file  Social History Narrative   Lives at home with the father of her children.     Social Drivers of Corporate investment banker Strain: Low Risk  (10/19/2020)   Overall Financial Resource Strain (CARDIA)    Difficulty of Paying Living Expenses: Not hard at all  Food Insecurity: No Food Insecurity (10/19/2020)   Hunger Vital Sign    Worried About Running Out of Food in the Last Year: Never true    Ran Out of Food in the Last Year: Never true  Transportation Needs: No Transportation Needs (10/19/2020)   PRAPARE - Administrator, Civil Service (Medical): No    Lack of Transportation (Non-Medical): No  Physical Activity:  Inactive (10/19/2020)   Exercise Vital Sign    Days of Exercise per Week: 0 days    Minutes of Exercise per Session: 0 min  Stress: Stress Concern Present (10/19/2020)   [name] of Occupational Health - Occupational Stress Questionnaire    Feeling of Stress : To some extent  Social Connections: Moderately Integrated (10/19/2020)   Social Connection and Isolation Panel    Frequency of Communication with Friends and Family: More than three times a week    Frequency of Social Gatherings with Friends and Family: More than three times a week    Attends Religious Services: More than 4 times  per year    Active Member of [employer] or Organizations: No    Attends Banker Meetings: Never    Marital Status: Living with partner  Intimate Partner Violence: Not At Risk (10/19/2020)   Humiliation, Afraid, Rape, and Kick questionnaire    Fear of Current or Ex-Partner: No    Emotionally Abused: No    Physically Abused: No    Sexually Abused: No    Outpatient Encounter Medications as of 11/21/2023  Medication Sig   ACCU-CHEK GUIDE test strip USE 1 UP TO 4 TIMES DAILY AS DIRECTED   Accu-Chek Softclix Lancets lancets Use as directed up to 4 times daily as directed Dx E11.9   albuterol  (VENTOLIN  HFA) 108 (90 Base) MCG/ACT inhaler Inhale 2 puffs into the lungs every 6 (six) hours as needed for wheezing or shortness of breath.   amLODipine  (NORVASC ) 10 MG tablet Take 1 tablet (10 mg total) by mouth daily.   Aspirin -Acetaminophen -Caffeine 520-260-32.5 MG PACK Take 1 packet by mouth 2 (two) times daily as needed. For pain or headache   baclofen  (LIORESAL ) 10 MG tablet Take 1 tablet (10 mg total) by mouth 3 (three) times daily.   Blood Glucose Monitoring Suppl (ACCU-CHEK GUIDE ME) w/Device KIT USE AS DIRECTED UP TO 4 TIMES DAILY AS DIRECTED Dx E11.9   busPIRone  (BUSPAR ) 7.5 MG tablet Take 1 tablet (7.5 mg total) by mouth daily.   cyclobenzaprine  (FLEXERIL ) 10 MG tablet Take 1 tablet (10 mg total) by mouth 3 (three) times daily.   diclofenac  Sodium (VOLTAREN ) 1 % GEL Apply 2 g topically 4 (four) times daily as needed (pain).   ezetimibe  (ZETIA ) 10 MG tablet Take 1 tablet (10 mg total) by mouth daily. TAKE 1 TABLET BY MOUTH ONCE DAILY   FLUoxetine  (PROZAC ) 40 MG capsule Take 1 capsule (40 mg total) by mouth 2 (two) times daily.   hydrochlorothiazide  (HYDRODIURIL ) 25 MG tablet Take 1 tablet (25 mg total) by mouth daily.   HYDROcodone -acetaminophen  (NORCO) 10-325 MG tablet Take 1 tablet by mouth every 6 (six) hours.   ibuprofen  (ADVIL ,MOTRIN ) 200 MG tablet Take 200 mg by mouth every 6 (six)  hours as needed. Takes 800 mg when needed for menstrual cramping   Incontinence Supplies KIT Patient needs Pull ups, chuck pads and wipes.   lidocaine  (LIDODERM ) 5 % Place 1 patch onto the skin daily. Remove & Discard patch within 12 hours or as directed by MD   lisinopril  (ZESTRIL ) 10 MG tablet Take 1 tablet (10 mg total) by mouth daily.   metFORMIN  (GLUCOPHAGE ) 500 MG tablet Take 1 tablet (500 mg total) by mouth 2 (two) times daily with a meal.   naproxen (NAPROSYN) 500 MG tablet Take 500 mg by mouth 2 (two) times daily as needed.   nitroGLYCERIN  (NITROSTAT ) 0.4 MG SL tablet DISSOLVE ONE TABLET UNDER THE TONGUE EVERY 5  MINUTES AS NEEDED FOR CHEST PAIN.  DO NOT EXCEED A TOTAL OF 3 DOSES IN 15 MINUTES (Patient taking differently: No sig reported)   omeprazole  (PRILOSEC) 40 MG capsule Take 1 capsule (40 mg total) by mouth daily.   rosuvastatin  (CRESTOR ) 20 MG tablet Take 1 tablet (20 mg total) by mouth daily.   Semaglutide ,0.25 or 0.5MG /DOS, (OZEMPIC , 0.25 OR 0.5 MG/DOSE,) 2 MG/3ML SOPN Inject 0.5 mg into the skin once a week.   silver sulfADIAZINE (SILVADENE) 1 % cream Apply 1 application  topically 2 (two) times daily as needed (rash/skin irritation).   simvastatin  (ZOCOR ) 40 MG tablet Take 40 mg by mouth daily at 6 PM.   zolpidem  (AMBIEN ) 10 MG tablet Take 1 tablet (10 mg total) by mouth at bedtime.   No facility-administered encounter medications on file as of 11/21/2023.    Allergies  Allergen Reactions   Butrans [Buprenorphine] Other (See Comments)    Skin irritation    Pertinent ROS per HPI, otherwise unremarkable      Objective:  BP 123/70   Pulse (!) 105   Temp 98.1 F (36.7 C)   Ht 5' 2 (1.575 m)   Wt 185 lb (83.9 kg)   SpO2 96%   BMI 33.84 kg/m    Wt Readings from Last 3 Encounters:  11/21/23 185 lb (83.9 kg)  11/12/23 184 lb (83.5 kg)  10/23/23 184 lb 6.4 oz (83.6 kg)    Physical Exam Vitals and nursing note reviewed.  Constitutional:      General: She is not  in acute distress.    Appearance: Normal appearance. She is obese. She is not ill-appearing, toxic-appearing or diaphoretic.  HENT:     Head: Normocephalic and atraumatic.     Nose: Nose normal.     Mouth/Throat:     Mouth: Mucous membranes are moist.     Pharynx: Oropharynx is clear.  Eyes:     Conjunctiva/sclera: Conjunctivae normal.     Pupils: Pupils are equal, round, and reactive to light.  Cardiovascular:     Rate and Rhythm: Normal rate and regular rhythm.     Heart sounds: Normal heart sounds.  Pulmonary:     Effort: Pulmonary effort is normal.     Breath sounds: Normal breath sounds.  Musculoskeletal:     Cervical back: Neck supple.     Right lower leg: No edema.     Left lower leg: No edema.  Skin:    General: Skin is warm and dry.     Capillary Refill: Capillary refill takes less than 2 seconds.     Coloration: Skin is not pale.  Neurological:     General: No focal deficit present.     Mental Status: She is alert and oriented to person, place, and time.  Psychiatric:        Mood and Affect: Mood normal.        Behavior: Behavior normal.        Thought Content: Thought content normal.        Judgment: Judgment normal.     Results for orders placed or performed in visit on 11/12/23  Bayer DCA Hb A1c Waived   Collection Time: 11/12/23 12:56 PM  Result Value Ref Range   HB A1C (BAYER DCA - WAIVED) 7.4 (H) 4.8 - 5.6 %  CBC with Differential/Platelet   Collection Time: 11/12/23 12:59 PM  Result Value Ref Range   WBC 12.9 (H) 3.4 - 10.8 x10E3/uL   RBC 4.17 3.77 -  5.28 x10E6/uL   Hemoglobin 10.6 (L) 11.1 - 15.9 g/dL   Hematocrit 64.6 65.9 - 46.6 %   MCV 85 79 - 97 fL   MCH 25.4 (L) 26.6 - 33.0 pg   MCHC 30.0 (L) 31.5 - 35.7 g/dL   RDW 85.0 88.2 - 84.5 %   Platelets 390 150 - 450 x10E3/uL   Neutrophils 72 Not Estab. %   Lymphs 20 Not Estab. %   Monocytes 5 Not Estab. %   Eos 1 Not Estab. %   Basos 1 Not Estab. %   Neutrophils Absolute 9.6 (H) 1.4 - 7.0  x10E3/uL   Lymphocytes Absolute 2.5 0.7 - 3.1 x10E3/uL   Monocytes Absolute 0.6 0.1 - 0.9 x10E3/uL   EOS (ABSOLUTE) 0.1 0.0 - 0.4 x10E3/uL   Basophils Absolute 0.1 0.0 - 0.2 x10E3/uL   Immature Granulocytes 1 Not Estab. %   Immature Grans (Abs) 0.1 0.0 - 0.1 x10E3/uL  CMP14+EGFR   Collection Time: 11/12/23 12:59 PM  Result Value Ref Range   Glucose 181 (H) 70 - 99 mg/dL   BUN 11 6 - 24 mg/dL   Creatinine, Ser 9.51 (L) 0.57 - 1.00 mg/dL   eGFR 888 >40 fO/fpw/8.26   BUN/Creatinine Ratio 23 9 - 23   Sodium 137 134 - 144 mmol/L   Potassium 4.5 3.5 - 5.2 mmol/L   Chloride 100 96 - 106 mmol/L   CO2 22 20 - 29 mmol/L   Calcium  9.1 8.7 - 10.2 mg/dL   Total Protein 5.9 (L) 6.0 - 8.5 g/dL   Albumin 3.9 3.8 - 4.9 g/dL   Globulin, Total 2.0 1.5 - 4.5 g/dL   Bilirubin Total <9.7 0.0 - 1.2 mg/dL   Alkaline Phosphatase 48 (L) 49 - 135 IU/L   AST 13 0 - 40 IU/L   ALT 16 0 - 32 IU/L  Lipid panel   Collection Time: 11/12/23 12:59 PM  Result Value Ref Range   Cholesterol, Total 184 100 - 199 mg/dL   Triglycerides 97 0 - 149 mg/dL   HDL 81 >60 mg/dL   VLDL Cholesterol Cal 17 5 - 40 mg/dL   LDL Chol Calc (NIH) 86 0 - 99 mg/dL   Chol/HDL Ratio 2.3 0.0 - 4.4 ratio       Pertinent labs & imaging results that were available during my care of the patient were reviewed by me and considered in my medical decision making.  Assessment & Plan:  Madison Gentry was seen today for dizziness, anxiety and fatigue.  Diagnoses and all orders for this visit:  Dark stools -     Fecal occult blood, imunochemical; Future -     Anemia Profile B  Low hemoglobin -     Fecal occult blood, imunochemical; Future -     Anemia Profile B  Malaise and fatigue -     Fecal occult blood, imunochemical; Future -     Anemia Profile B  Chronic midline low back pain without sciatica Continue current regimen, follow up with PCP for persistent or worsening symptoms.      Anemia with associated fatigue, dizziness, and dark  stools Mild anemia with hemoglobin level at 10.6 g/dL. Symptoms include fatigue, dizziness, and dark stools, potentially indicating gastrointestinal bleeding. However, dark stools may also result from iron supplementation, which has not yet been initiated. No significant palpitations or shortness of breath. Minor bruising on arms without abnormal bleeding. Symptoms may be exacerbated by recent discontinuation of prednisone . - Order anemia profile to further  evaluate anemia. - Order FOBT test to check for blood in stool. - Advise starting over-the-counter iron supplementation with increased water intake and stool softeners to prevent constipation. - If FOBT test is positive, refer to GI for further evaluation.       Continue all other maintenance medications.  Follow up plan: Return if symptoms worsen or fail to improve.   The above assessment and management plan was discussed with the patient. The patient verbalized understanding of and has agreed to the management plan. Patient is aware to call the clinic if they develop any new symptoms or if symptoms persist or worsen. Patient is aware when to return to the clinic for a follow-up visit. Patient educated on when it is appropriate to go to the emergency department.   Madison Bruns, FNP-C [facility] [phone]

## 2023-11-22 ENCOUNTER — Ambulatory Visit: Payer: Self-pay | Admitting: Family Medicine

## 2023-11-22 LAB — ANEMIA PROFILE B
Basophils Absolute: 0.1 x10E3/uL (ref 0.0–0.2)
Basos: 1 %
EOS (ABSOLUTE): 0.3 x10E3/uL (ref 0.0–0.4)
Eos: 3 %
Ferritin: 16 ng/mL (ref 15–150)
Folate: 11.1 ng/mL (ref >3.0)
Hematocrit: 37.8 % (ref 34.0–46.6)
Hemoglobin: 11.6 g/dL (ref 11.1–15.9)
Immature Grans (Abs): 0.1 x10E3/uL (ref 0.0–0.1)
Immature Granulocytes: 1 %
Iron Saturation: 8 % — CL (ref 15–55)
Iron: 33 ug/dL (ref 27–159)
Lymphocytes Absolute: 3.3 x10E3/uL — ABNORMAL HIGH (ref 0.7–3.1)
Lymphs: 30 %
MCH: 25.8 pg — ABNORMAL LOW (ref 26.6–33.0)
MCHC: 30.7 g/dL — ABNORMAL LOW (ref 31.5–35.7)
MCV: 84 fL (ref 79–97)
Monocytes Absolute: 0.8 x10E3/uL (ref 0.1–0.9)
Monocytes: 7 %
Neutrophils Absolute: 6.4 x10E3/uL (ref 1.4–7.0)
Neutrophils: 58 %
Platelets: 434 x10E3/uL (ref 150–450)
RBC: 4.5 x10E6/uL (ref 3.77–5.28)
RDW: 14.9 % (ref 11.7–15.4)
Retic Ct Pct: 2 % (ref 0.6–2.6)
Total Iron Binding Capacity: 408 ug/dL (ref 250–450)
UIBC: 375 ug/dL (ref 131–425)
Vitamin B-12: 796 pg/mL (ref 232–1245)
WBC: 10.9 x10E3/uL — ABNORMAL HIGH (ref 3.4–10.8)

## 2023-11-25 ENCOUNTER — Telehealth: Payer: Self-pay | Admitting: Nurse Practitioner

## 2023-11-25 ENCOUNTER — Telehealth: Payer: Self-pay | Admitting: Family Medicine

## 2023-11-25 NOTE — Telephone Encounter (Signed)
 Copied from CRM [id number]. Topic: Clinical - Lab/Test Results >> Nov 25, 2023 11:15 AM Madison Gentry wrote: Reason for CRM: patient is calling back in regards to her lab results.

## 2023-11-25 NOTE — Telephone Encounter (Unsigned)
 Copied from CRM [id number]. Topic: Clinical - Medication Question >> Nov 25, 2023  4:12 PM Zane F wrote: Reason for CRM:   Preferred Pharmacy: [facility] - [place], [place] - [address] [place] [address] Phone: [phone] Fax: [phone] Hours: Not open 24 hours   Patient is calling in to inform the office that after being told that a supplement would be sent over to her preferred pharmacy, her pharmacy has not received the new prescription. Patient informed the specialist that she was told her iron levels were low which was the need for the supplement. Please work with the patient's provider to get the supplement needed sent over to the patient preferred pharmacy.   Please call patient once the prescription has been sent.   Callback Number: [phone] >> Nov 25, 2023  4:17 PM Zane F wrote: Patient also would like to know if she should purchase an over the counter iron supplement until the prescription is filled?

## 2023-11-25 NOTE — Telephone Encounter (Signed)
 Called and spoke with patient about lab results. Patient verbalized understanding. States she already has a follow up appt scheduled with MMM in 6 weeks. Will recheck then. She will go ahead and complete the FOBT and get it back to us .

## 2023-11-26 MED ORDER — IRON (FERROUS SULFATE) 325 (65 FE) MG PO TABS: 325.0000 mg | ORAL_TABLET | Freq: Every day | ORAL | 3 refills | Status: AC

## 2023-11-26 NOTE — Telephone Encounter (Signed)
 I called and spoke with patient and made her aware.

## 2023-11-26 NOTE — Telephone Encounter (Signed)
 Patient was notified of results yesterday. What supplement did you want to send to the pharmacy?

## 2023-12-24 ENCOUNTER — Ambulatory Visit: Admitting: Nurse Practitioner

## 2023-12-24 ENCOUNTER — Encounter: Payer: Self-pay | Admitting: Nurse Practitioner

## 2023-12-24 VITALS — BP 119/69 | HR 89 | Temp 97.5°F | Ht 62.0 in | Wt 193.2 lb

## 2023-12-24 DIAGNOSIS — R062 Wheezing: Secondary | ICD-10-CM | POA: Insufficient documentation

## 2023-12-24 DIAGNOSIS — L739 Follicular disorder, unspecified: Secondary | ICD-10-CM | POA: Diagnosis not present

## 2023-12-24 DIAGNOSIS — J441 Chronic obstructive pulmonary disease with (acute) exacerbation: Secondary | ICD-10-CM | POA: Insufficient documentation

## 2023-12-24 MED ORDER — ALBUTEROL SULFATE HFA 108 (90 BASE) MCG/ACT IN AERS: 2.0000 | INHALATION_SPRAY | Freq: Four times a day (QID) | RESPIRATORY_TRACT | 2 refills | Status: AC | PRN

## 2023-12-24 MED ORDER — CLINDAMYCIN PHOS (TWICE-DAILY) 1 % EX GEL: Freq: Two times a day (BID) | CUTANEOUS | 0 refills | Status: AC

## 2023-12-24 MED ORDER — KETOCONAZOLE 2 % EX SHAM: 1.0000 | MEDICATED_SHAMPOO | CUTANEOUS | 0 refills | Status: AC

## 2023-12-24 MED ORDER — METHYLPREDNISOLONE 4 MG PO TBPK: ORAL_TABLET | ORAL | 0 refills | Status: DC

## 2023-12-24 NOTE — Progress Notes (Signed)
 Subjective:  Patient ID: Madison Gentry, female    DOB: [birth year], 58 y.o.   MRN: [id number]  Patient Care Team: Gladis Mustard, FNP as PCP - General (Family Medicine) Gladis Mustard, FNP as Nurse Practitioner (Nurse Practitioner) Center, [facility] (Obstetrics and Gynecology)   Chief Complaint:  Rash (Rash on back of head looks like blisters for a week) and Headache   HPI: Madison Gentry is a 58 y.o. female presenting on 12/24/2023 for Rash (Rash on back of head looks like blisters for a week) and Headache   Discussed the use of AI scribe software for clinical note transcription with the patient, who gave verbal consent to proceed.  History of Present Illness [name] is a 58 year old female who presents with a rash on the right side of her head and wheezing.  She has developed painful bumps on the right side of her head for approximately one week, with no associated fever. Her daughter suggested it might be shingles, but no treatment has been attempted yet.  She experiences severe headaches, wheezing, and a dry cough. She smokes about half a pack of cigarettes per day and does not currently have an inhaler at home, although she has used one in the past. Prednisone , previously prescribed for wheezing, has been effective for her.   Denies chest pain, syncope, change in vision or blurry vision  Relevant past medical, surgical, family, and social history reviewed and updated as indicated.  Allergies and medications reviewed and updated. Data reviewed: Chart in Epic.   Past Medical History:  Diagnosis Date   CAD (coronary artery disease)    COPD (chronic obstructive pulmonary disease) (HCC)    Diabetes (HCC)    Hypertension    Stroke [facility])     Past Surgical History:  Procedure Laterality Date   TUBAL LIGATION      Social History   Socioeconomic History   Marital status: Single    Spouse name: Not on file   Number of children: 3   Years of education:  Not on file   Highest education level: Not on file  Occupational History   Not on file  Tobacco Use   Smoking status: Every Day    Current packs/day: 1.50    Average packs/day: 1.5 packs/day for 28.0 years (42.0 ttl pk-yrs)    Types: Cigarettes   Smokeless tobacco: Never  Substance and Sexual Activity   Alcohol use: Yes    Comment: occas   Drug use: No   Sexual activity: Yes    Birth control/protection: None  Other Topics Concern   Not on file  Social History Narrative   Lives at home with the father of her children.     Social Drivers of Corporate Investment Banker Strain: Low Risk  (10/19/2020)   Overall Financial Resource Strain (CARDIA)    Difficulty of Paying Living Expenses: Not hard at all  Food Insecurity: No Food Insecurity (10/19/2020)   Hunger Vital Sign    Worried About Running Out of Food in the Last Year: Never true    Ran Out of Food in the Last Year: Never true  Transportation Needs: No Transportation Needs (10/19/2020)   PRAPARE - Administrator, Civil Service (Medical): No    Lack of Transportation (Non-Medical): No  Physical Activity: Inactive (10/19/2020)   Exercise Vital Sign    Days of Exercise per Week: 0 days    Minutes of Exercise per Session: 0 min  Stress:  Stress Concern Present (10/19/2020)   [name] of Occupational Health - Occupational Stress Questionnaire    Feeling of Stress : To some extent  Social Connections: Moderately Integrated (10/19/2020)   Social Connection and Isolation Panel    Frequency of Communication with Friends and Family: More than three times a week    Frequency of Social Gatherings with Friends and Family: More than three times a week    Attends Religious Services: More than 4 times per year    Active Member of [employer] or Organizations: No    Attends Banker Meetings: Never    Marital Status: Living with partner  Intimate Partner Violence: Not At Risk (10/19/2020)   Humiliation, Afraid,  Rape, and Kick questionnaire    Fear of Current or Ex-Partner: No    Emotionally Abused: No    Physically Abused: No    Sexually Abused: No    Outpatient Encounter Medications as of 12/24/2023  Medication Sig   ACCU-CHEK GUIDE test strip USE 1 UP TO 4 TIMES DAILY AS DIRECTED   Accu-Chek Softclix Lancets lancets Use as directed up to 4 times daily as directed Dx E11.9   amLODipine  (NORVASC ) 10 MG tablet Take 1 tablet (10 mg total) by mouth daily.   Aspirin -Acetaminophen -Caffeine 520-260-32.5 MG PACK Take 1 packet by mouth 2 (two) times daily as needed. For pain or headache   baclofen  (LIORESAL ) 10 MG tablet Take 1 tablet (10 mg total) by mouth 3 (three) times daily.   Blood Glucose Monitoring Suppl (ACCU-CHEK GUIDE ME) w/Device KIT USE AS DIRECTED UP TO 4 TIMES DAILY AS DIRECTED Dx E11.9   busPIRone  (BUSPAR ) 7.5 MG tablet Take 1 tablet (7.5 mg total) by mouth daily.   clindamycin  (CLINDAGEL) 1 % gel Apply topically 2 (two) times daily.   cyclobenzaprine  (FLEXERIL ) 10 MG tablet Take 1 tablet (10 mg total) by mouth 3 (three) times daily.   diclofenac  Sodium (VOLTAREN ) 1 % GEL Apply 2 g topically 4 (four) times daily as needed (pain).   ezetimibe  (ZETIA ) 10 MG tablet Take 1 tablet (10 mg total) by mouth daily. TAKE 1 TABLET BY MOUTH ONCE DAILY   FLUoxetine  (PROZAC ) 40 MG capsule Take 1 capsule (40 mg total) by mouth 2 (two) times daily.   hydrochlorothiazide  (HYDRODIURIL ) 25 MG tablet Take 1 tablet (25 mg total) by mouth daily.   HYDROcodone -acetaminophen  (NORCO) 10-325 MG tablet Take 1 tablet by mouth every 6 (six) hours.   ibuprofen  (ADVIL ,MOTRIN ) 200 MG tablet Take 200 mg by mouth every 6 (six) hours as needed. Takes 800 mg when needed for menstrual cramping   Incontinence Supplies KIT Patient needs Pull ups, chuck pads and wipes.   Iron, Ferrous Sulfate, 325 (65 Fe) MG TABS Take 325 mg by mouth daily.   [START ON 12/26/2023] ketoconazole (NIZORAL) 2 % shampoo Apply 1 Application topically 2  (two) times a week.   lidocaine  (LIDODERM ) 5 % Place 1 patch onto the skin daily. Remove & Discard patch within 12 hours or as directed by MD   lisinopril  (ZESTRIL ) 10 MG tablet Take 1 tablet (10 mg total) by mouth daily.   metFORMIN  (GLUCOPHAGE ) 500 MG tablet Take 1 tablet (500 mg total) by mouth 2 (two) times daily with a meal.   methylPREDNISolone  (MEDROL  DOSEPAK) 4 MG TBPK tablet Follow instructions on the box   naproxen (NAPROSYN) 500 MG tablet Take 500 mg by mouth 2 (two) times daily as needed.   nitroGLYCERIN  (NITROSTAT ) 0.4 MG SL tablet DISSOLVE ONE TABLET  UNDER THE TONGUE EVERY 5 MINUTES AS NEEDED FOR CHEST PAIN.  DO NOT EXCEED A TOTAL OF 3 DOSES IN 15 MINUTES (Patient taking differently: No sig reported)   omeprazole  (PRILOSEC) 40 MG capsule Take 1 capsule (40 mg total) by mouth daily.   rosuvastatin  (CRESTOR ) 20 MG tablet Take 1 tablet (20 mg total) by mouth daily.   Semaglutide ,0.25 or 0.5MG /DOS, (OZEMPIC , 0.25 OR 0.5 MG/DOSE,) 2 MG/3ML SOPN Inject 0.5 mg into the skin once a week.   silver sulfADIAZINE (SILVADENE) 1 % cream Apply 1 application  topically 2 (two) times daily as needed (rash/skin irritation).   simvastatin  (ZOCOR ) 40 MG tablet Take 40 mg by mouth daily at 6 PM.   zolpidem  (AMBIEN ) 10 MG tablet Take 1 tablet (10 mg total) by mouth at bedtime.   [DISCONTINUED] albuterol  (VENTOLIN  HFA) 108 (90 Base) MCG/ACT inhaler Inhale 2 puffs into the lungs every 6 (six) hours as needed for wheezing or shortness of breath.   albuterol  (VENTOLIN  HFA) 108 (90 Base) MCG/ACT inhaler Inhale 2 puffs into the lungs every 6 (six) hours as needed for wheezing or shortness of breath.   No facility-administered encounter medications on file as of 12/24/2023.    Allergies  Allergen Reactions   Butrans [Buprenorphine] Other (See Comments)    Skin irritation    Pertinent ROS per HPI, otherwise unremarkable      Objective:  BP 119/69   Pulse 89   Temp (!) 97.5 F (36.4 C) (Temporal)    Ht 5' 2 (1.575 m)   Wt 193 lb 3.2 oz (87.6 kg)   SpO2 96%   BMI 35.34 kg/m    Wt Readings from Last 3 Encounters:  12/24/23 193 lb 3.2 oz (87.6 kg)  11/21/23 185 lb (83.9 kg)  11/12/23 184 lb (83.5 kg)    Physical Exam Vitals and nursing note reviewed.  Constitutional:      General: She is not in acute distress. HENT:     Head: Normocephalic and atraumatic.     Nose: Nose normal.  Eyes:     General: No scleral icterus.    Extraocular Movements: Extraocular movements intact.     Conjunctiva/sclera: Conjunctivae normal.     Pupils: Pupils are equal, round, and reactive to light.  Cardiovascular:     Heart sounds: Normal heart sounds.  Pulmonary:     Effort: Pulmonary effort is normal.     Breath sounds: Wheezing present.  Musculoskeletal:        General: Normal range of motion.     Right lower leg: No edema.     Left lower leg: No edema.  Skin:    General: Skin is warm and dry.     Findings: No rash.  Neurological:     Mental Status: She is alert and oriented to person, place, and time.  Psychiatric:        Mood and Affect: Mood normal.        Behavior: Behavior normal.        Thought Content: Thought content normal.        Judgment: Judgment normal.    Physical Exam HEENT: Folliculitis on the right side of the head.     Results for orders placed or performed in visit on 11/21/23  Anemia Profile B   Collection Time: 11/21/23  3:48 PM  Result Value Ref Range   Total Iron Binding Capacity 408 250 - 450 ug/dL   UIBC 624 868 - 574 ug/dL   Iron 33  27 - 159 ug/dL   Iron Saturation 8 (LL) 15 - 55 %   Ferritin 16 15 - 150 ng/mL   Vitamin B-12 796 232 - 1,245 pg/mL   Folate 11.1 >3.0 ng/mL   WBC 10.9 (H) 3.4 - 10.8 x10E3/uL   RBC 4.50 3.77 - 5.28 x10E6/uL   Hemoglobin 11.6 11.1 - 15.9 g/dL   Hematocrit 62.1 65.9 - 46.6 %   MCV 84 79 - 97 fL   MCH 25.8 (L) 26.6 - 33.0 pg   MCHC 30.7 (L) 31.5 - 35.7 g/dL   RDW 85.0 88.2 - 84.5 %   Platelets 434 150 - 450  x10E3/uL   Neutrophils 58 Not Estab. %   Lymphs 30 Not Estab. %   Monocytes 7 Not Estab. %   Eos 3 Not Estab. %   Basos 1 Not Estab. %   Neutrophils Absolute 6.4 1.4 - 7.0 x10E3/uL   Lymphocytes Absolute 3.3 (H) 0.7 - 3.1 x10E3/uL   Monocytes Absolute 0.8 0.1 - 0.9 x10E3/uL   EOS (ABSOLUTE) 0.3 0.0 - 0.4 x10E3/uL   Basophils Absolute 0.1 0.0 - 0.2 x10E3/uL   Immature Granulocytes 1 Not Estab. %   Immature Grans (Abs) 0.1 0.0 - 0.1 x10E3/uL   Retic Ct Pct 2.0 0.6 - 2.6 %       Pertinent labs & imaging results that were available during my care of the patient were reviewed by me and considered in my medical decision making.  Assessment & Plan:  Mackayla was seen today for rash and headache.  Diagnoses and all orders for this visit:  COPD with exacerbation (HCC) -     albuterol  (VENTOLIN  HFA) 108 (90 Base) MCG/ACT inhaler; Inhale 2 puffs into the lungs every 6 (six) hours as needed for wheezing or shortness of breath. -     methylPREDNISolone  (MEDROL  DOSEPAK) 4 MG TBPK tablet; Follow instructions on the box  Wheezing -     albuterol  (VENTOLIN  HFA) 108 (90 Base) MCG/ACT inhaler; Inhale 2 puffs into the lungs every 6 (six) hours as needed for wheezing or shortness of breath. -     methylPREDNISolone  (MEDROL  DOSEPAK) 4 MG TBPK tablet; Follow instructions on the box  Folliculitis -     ketoconazole (NIZORAL) 2 % shampoo; Apply 1 Application topically 2 (two) times a week. -     clindamycin  (CLINDAGEL) 1 % gel; Apply topically 2 (two) times daily.     Assessment and Plan Assessment & Plan Chronic obstructive pulmonary disease with acute exacerbation and wheezing Acute exacerbation with wheezing and dry cough. Smokes half a pack per day. No current inhaler use. Previous effective treatment with prednisone . - Prescribed Medrol  Dosepak (prednisone ) and instructed to follow pack instructions. - Prescribed inhaler Ventolin  for home use.  Folliculitis of right scalp Inflammation of hair  follicles on right scalp. No blisters, ruling out shingles. Not contagious. - Prescribed ketoconazole shampoo twice weekly. - Clindamycin  gel twice a day      Continue all other maintenance medications.  Follow up plan: Return if symptoms worsen or fail to improve.   Continue healthy lifestyle choices, including diet (rich in fruits, vegetables, and lean proteins, and low in salt and simple carbohydrates) and exercise (at least 30 minutes of moderate physical activity daily).  Educational handout given for    Clinical References  COPD and Physical Activity Chronic obstructive pulmonary disease (COPD) is a long-term, or chronic, condition that affects the lungs. COPD is a general term that can be used to  describe many problems that cause inflammation of the lungs and limit airflow. These conditions include chronic bronchitis and emphysema. The main symptom of COPD is shortness of breath, which makes it harder to do even simple tasks. This can also make it harder to exercise and stay active. Talk with your health care provider about treatments to help you breathe better and actions you can take to prevent breathing problems during physical activity. What are the benefits of exercising when you have COPD? Exercising regularly is an important part of a healthy lifestyle. You can still exercise and do physical activities even though you have COPD. Exercise and physical activity improve your shortness of breath by increasing blood flow (circulation). This causes your heart to pump more oxygen through your body. Moderate exercise can: Improve oxygen use. Increase your energy level. Help with shortness of breath. Strengthen your breathing muscles. Improve heart health. Help with sleep. Improve your self-esteem and feelings of self-worth. Lower depression, stress, and anxiety. Exercise can benefit everyone with COPD. The severity of your disease may affect how hard you can exercise, especially  at first, but everyone can benefit. Talk with your health care provider about how much exercise is safe for you, and which activities and exercises are safe for you. What actions can I take to prevent breathing problems during physical activity? Sign up for a pulmonary rehabilitation program. This type of program may include: Education about lung diseases. Exercise classes that teach you how to exercise and be more active while improving your breathing. This usually involves: Exercise using your lower extremities, such as a stationary bicycle. About 30 minutes of exercise, 2 to 5 times per week, for 6 to 12 weeks. Strength training, such as push-ups or leg lifts. Nutrition education. Group classes in which you can talk with others who also have COPD and learn ways to manage stress. If you use an oxygen tank, you should use it while you exercise. Work with your health care provider to adjust your oxygen for your physical activity. Your resting flow rate is different from your flow rate during physical activity. How to manage your breathing while exercising While you are exercising: Take slow breaths. Pace yourself, and do nottry to go too fast. Purse your lips while breathing out. Pursing your lips is similar to a kissing or whistling position. If doing exercise that uses a quick burst of effort, such as weight lifting: Breathe in before starting the exercise. Breathe out during the hardest part of the exercise, such as raising the weights. Where to find support You can find support for exercising with COPD from: Your health care provider. A pulmonary rehabilitation program. Your local health department or community health programs. Support groups, either online or in-person. Your health care provider may be able to recommend support groups. Where to find more information You can find more information about exercising with COPD from: American Lung Association: lung.org COPD Foundation:  copdfoundation.org Contact a health care provider if: Your symptoms get worse. You have nausea. You have a fever. You want to start a new exercise program or a new activity. Get help right away if: You have chest pain. You cannot breathe. These symptoms may represent a serious problem that is an emergency. Do not wait to see if the symptoms will go away. Get medical help right away. Call your local emergency services (911 in the [place] Do not drive yourself to the hospital. Summary COPD is a general term that can be used to describe many  different lung problems that cause lung inflammation and limit airflow. This includes chronic bronchitis and emphysema. Exercise and physical activity improve your shortness of breath by increasing blood flow (circulation). This causes your heart to provide more oxygen to your body. Contact your health care provider before starting any exercise program or new activity. Ask your health care provider what exercises and activities are safe for you. This information is not intended to replace advice given to you by your health care provider. Make sure you discuss any questions you have with your health care provider. Document Revised: 12/28/2022 Document Reviewed: 12/28/2022 Elsevier Patient Education  2024 Elsevier Inc. Chronic Obstructive Pulmonary Disease Exacerbation  Chronic obstructive pulmonary disease (COPD) is a long-term (chronic) lung problem. When you have COPD, it can feel harder to breathe in or out. COPD exacerbation is a flare-up of symptoms when breathing gets worse and more treatment may be needed. Without treatment, flare-ups can be life-threatening. If they happen often, your lungs can become more damaged. What are the causes? Not taking your usual COPD medicines as told by your health care provider. A cold or the flu, which can cause infection in your lungs. Being exposed to things that make your breathing worse, such as: Smoke. Air  pollution. Fumes. Dust. Allergies. Weather changes. What are the signs or symptoms? Symptoms do not get better or get worse even if you take your medicines as told by your provider. Symptoms may include: More shortness of breath. You may only be able to speak one or two words at a time. More coughing or mucus from your lungs. More wheezing or chest tightness. Being more tired and having less energy. Confusion. How is this diagnosed? This condition is diagnosed based on: Symptoms that get worse. Your medical history. A physical exam. You may also have tests, including: A chest X-ray. Blood or mucus tests. How is this treated? You may be able to stay home or you may need to go to the hospital. Treatment may include: Taking medicines. These may include: Inhalers. These have medicines in them that you breathe in. These may be more of what you already take or they may be new. Steroids. These reduce inflammation in the airways. These may be inhaled, taken by mouth, or given in an IV. Antibiotics. These treat infection. Using oxygen. Using a device to help you clear mucus. Follow these instructions at home: Medicines Take your medicines only as told by your provider. If you were given antibiotics or steroids, take them as told by your provider. Do not stop taking them even if you start to feel better. Lifestyle Several times a day, wash your hands with soap and water for at least 20 seconds. If you cannot use soap and water, use hand sanitizer. This may help keep you from getting an infection. Avoid being around crowds or people who are sick. Do not smoke or use any products that contain nicotine  or tobacco. If you need help quitting, ask your provider. Return to your normal activities when your provider says that it's safe. Use breathing methods to control your stress and catch your breath. How is this prevented? Follow your COPD action plan. The action plan tells you what to do if  you're feeling good and what to do when you start feeling worse. Discuss the plan often with your provider. Make sure you get all the shots, also called vaccines, that your provider recommends. Ask your provider about a flu shot and a pneumonia shot. Use oxygen therapy  if told by your provider. If you need home oxygen therapy, ask your provider how often to check your oxygen level with a device called an oximeter. Keep all follow-up visits to review your COPD action plan. Your provider will want to check on your condition often to keep you healthy and out of the hospital. Contact a health care provider if: Your COPD symptoms get worse. You have a fever or chills. You have trouble doing daily activities. You have trouble breathing even when you are resting. Get help right away if: You are short of breath and cannot: Talk in full sentences. Do normal activities. You have chest pain. You feel confused. These symptoms may be an emergency. Call 911 right away. Do not wait to see if the symptoms will go away. Do not drive yourself to the hospital. This information is not intended to replace advice given to you by your health care provider. Make sure you discuss any questions you have with your health care provider. Document Revised: 11/15/2022 Document Reviewed: 04/30/2022 Elsevier Patient Education  2024 Elsevier Inc. Shortness of Breath, Adult Shortness of breath is when a person has trouble breathing or when a person feels like she or he is having trouble breathing in enough air. Shortness of breath could be a sign of a medical problem. Follow these instructions at home:  Pollutants Do not use any products that contain nicotine  or tobacco. These products include cigarettes, chewing tobacco, and vaping devices, such as e-cigarettes. This also includes cigars and pipes. If you need help quitting, ask your health care provider. Avoid things that can irritate your airways, including: Smoke.  This includes campfire smoke, forest fire smoke, and secondhand smoke from tobacco products. Do not smoke or allow others to smoke in your home. Mold. Dust. Air pollution. Chemical fumes. Things that can give you an allergic reaction (allergens) if you have allergies. Common allergens include pollen from grasses or trees and animal dander. Keep your living space clean and free of mold and dust. General instructions Pay attention to any changes in your symptoms. Take over-the-counter and prescription medicines only as told by your health care provider. This includes oxygen therapy and inhaled medicines. Rest as needed. Return to your normal activities as told by your health care provider. Ask your health care provider what activities are safe for you. Keep all follow-up visits. This is important. Contact a health care provider if: Your condition does not improve as soon as expected. You have a hard time doing your normal activities, even after you rest. You have new symptoms. You cannot walk up stairs or exercise the way that you normally do. Get help right away if: Your shortness of breath gets worse. You have shortness of breath when you are resting. You feel light-headed or you faint. You have a cough that is not controlled with medicines. You cough up blood. You have pain with breathing. You have pain in your chest, arms, shoulders, or abdomen. You have a fever. These symptoms may be an emergency. Get help right away. Call 911. Do not wait to see if the symptoms will go away. Do not drive yourself to the hospital. Summary Shortness of breath is when a person has trouble breathing enough air. It can be a sign of a medical problem. Avoid things that irritate your lungs, such as smoking, pollution, mold, and dust. Pay attention to changes in your symptoms and contact your health care provider if you have a hard time completing daily activities because  of shortness of breath. This  information is not intended to replace advice given to you by your health care provider. Make sure you discuss any questions you have with your health care provider. Document Revised: 10/01/2020 Document Reviewed: 10/01/2020 Elsevier Patient Education  2024 Elsevier Inc. Shortness of Breath, Adult Shortness of breath means you have trouble breathing. Shortness of breath could be a sign of a medical problem. Follow these instructions at home:  Pollution Do not smoke or use any products that contain nicotine  or tobacco. If you need help quitting, ask your doctor. Avoid things that can make it harder to breathe, such as: Smoke of all kinds. This includes smoke from campfires or forest fires. Do not smoke or allow others to smoke in your home. Mold. Dust. Air pollution. Chemical smells. Things that can give you an allergic reaction (allergens) if you have allergies. Keep your living space clean. Use products that help remove mold and dust. General instructions Watch for any changes in your symptoms. Take over-the-counter and prescription medicines only as told by your doctor. This includes oxygen therapy and inhaled medicines. Rest as needed. Return to your normal activities when your doctor says that it is safe. Keep all follow-up visits. Contact a doctor if: Your condition does not get better as soon as expected. You have a hard time doing your normal activities, even after you rest. You have new symptoms. You cannot walk up stairs. You cannot exercise the way you normally do. Get help right away if: Your shortness of breath gets worse. You have trouble breathing when you are resting. You feel light-headed or you faint. You have a cough that is not helped by medicines. You cough up blood. You have pain with breathing. You have pain in your chest, arms, shoulders, or belly (abdomen). You have a fever. These symptoms may be an emergency. Get help right away. Call 911. Do not wait  to see if the symptoms will go away. Do not drive yourself to the hospital. Summary Shortness of breath is when you have trouble breathing enough air. It can be a sign of a medical problem. Avoid things that make it hard for you to breathe, such as smoking, pollution, mold, and dust. Watch for any changes in your symptoms. Contact your doctor if you do not get better or you get worse. This information is not intended to replace advice given to you by your health care provider. Make sure you discuss any questions you have with your health care provider. Document Revised: 10/01/2020 Document Reviewed: 10/01/2020 Elsevier Patient Education  2024 Elsevier Inc. Folliculitis  Folliculitis occurs when hair follicles become inflamed. A hair follicle is a tiny opening in your skin where your hair grows from. This condition often occurs on the scalp, thighs, legs, back, and buttocks but can happen anywhere on the body. What are the causes? A common cause of this condition is an infection from bacteria. The type of folliculitis caused by bacteria can last a long time or go away and come back. The bacteria can live anywhere on your skin. They are often found in the nostrils. Other causes may include: An infection from a fungus. An infection from a virus. Your skin touching some chemicals, such as oils and tars. Shaving or waxing. Greasy ointments or creams put on the skin. What increases the risk? You are more likely to develop this condition if: Your body has a weak disease-fighting system (immune system). You have diabetes. You are obese. What are  the signs or symptoms? Symptoms of this condition include: Redness. Soreness. Swelling. Itching. Small white or yellow, itchy spots filled with pus (pustules) that appear over a red area. If the infection goes deep into the follicle, these may turn into a boil (furuncle). A group of boils (carbuncle). These tend to form in hairy, sweaty areas of the  body. How is this diagnosed? This condition is diagnosed with a skin exam. Your health care provider may take a sample of one of the pustules or boils to test in a lab. How is this treated? This condition may be treated by: Putting a warm, wet cloth (warm compress) on the affected areas. Taking antibiotics or applying them to the skin. Applying or bathing with a solution that kills germs (antiseptic). Taking an over-the-counter medicine. This can help with itching. Having a procedure to drain pustules or boils. This may be done if a pustule or boil contains a lot of pus or fluid. Having laser hair removal. This may be done when the condition lasts for a long time. Follow these instructions at home: Managing pain and swelling  If directed, apply heat to the affected area as often as told by your health care provider. Use the heat source that your health care provider recommends, such as a moist heat pack or a heating pad. Place a towel between your skin and the heat source. Leave the heat on for 20-30 minutes. If your skin turns bright red, remove the heat right away to prevent burns. The risk of burns is higher if you cannot feel pain, heat, or cold. General instructions Take over-the-counter and prescription medicines only as told by your health care provider. If you were prescribed antibiotics, take or apply them as told by your health care provider. Do not stop using the antibiotic even if you start to feel better. Check your irritated area every day for signs of infection. Check for: More redness, swelling, or pain. Fluid or blood. Warmth. Pus or a bad smell. Do not shave irritated skin. Keep all follow-up visits. Your health care provider will check if the treatments are helping. Contact a health care provider if: You have a fever. You have any signs of infection. Red streaks are spreading from the affected area. This information is not intended to replace advice given to you by  your health care provider. Make sure you discuss any questions you have with your health care provider. Document Revised: 07/18/2021 Document Reviewed: 07/18/2021 Elsevier Patient Education  2024 Elsevier Inc.  The above assessment and management plan was discussed with the patient. The patient verbalized understanding of and has agreed to the management plan. Patient is aware to call the clinic if they develop any new symptoms or if symptoms persist or worsen. Patient is aware when to return to the clinic for a follow-up visit. Patient educated on when it is appropriate to go to the emergency department.       Kaydenn Mclear [place] Thompson, DNP [facility] [address] [place], [place] [address] [phone]

## 2024-01-28 ENCOUNTER — Other Ambulatory Visit: Payer: Self-pay | Admitting: Nurse Practitioner

## 2024-01-28 DIAGNOSIS — E119 Type 2 diabetes mellitus without complications: Secondary | ICD-10-CM

## 2024-02-11 ENCOUNTER — Encounter: Payer: Self-pay | Admitting: Nurse Practitioner

## 2024-02-11 ENCOUNTER — Ambulatory Visit (INDEPENDENT_AMBULATORY_CARE_PROVIDER_SITE_OTHER): Payer: Self-pay | Admitting: Nurse Practitioner

## 2024-02-11 VITALS — BP 102/76 | HR 88 | Temp 97.5°F | Ht 62.0 in | Wt 198.0 lb

## 2024-02-11 DIAGNOSIS — I10 Essential (primary) hypertension: Secondary | ICD-10-CM

## 2024-02-11 DIAGNOSIS — Z7984 Long term (current) use of oral hypoglycemic drugs: Secondary | ICD-10-CM | POA: Diagnosis not present

## 2024-02-11 DIAGNOSIS — F411 Generalized anxiety disorder: Secondary | ICD-10-CM

## 2024-02-11 DIAGNOSIS — R1011 Right upper quadrant pain: Secondary | ICD-10-CM

## 2024-02-11 DIAGNOSIS — K219 Gastro-esophageal reflux disease without esophagitis: Secondary | ICD-10-CM | POA: Diagnosis not present

## 2024-02-11 DIAGNOSIS — J411 Mucopurulent chronic bronchitis: Secondary | ICD-10-CM | POA: Diagnosis not present

## 2024-02-11 DIAGNOSIS — F3341 Major depressive disorder, recurrent, in partial remission: Secondary | ICD-10-CM

## 2024-02-11 DIAGNOSIS — E1169 Type 2 diabetes mellitus with other specified complication: Secondary | ICD-10-CM

## 2024-02-11 DIAGNOSIS — F5101 Primary insomnia: Secondary | ICD-10-CM | POA: Diagnosis not present

## 2024-02-11 DIAGNOSIS — G8929 Other chronic pain: Secondary | ICD-10-CM

## 2024-02-11 DIAGNOSIS — D649 Anemia, unspecified: Secondary | ICD-10-CM

## 2024-02-11 DIAGNOSIS — Z720 Tobacco use: Secondary | ICD-10-CM

## 2024-02-11 DIAGNOSIS — N393 Stress incontinence (female) (male): Secondary | ICD-10-CM

## 2024-02-11 DIAGNOSIS — E119 Type 2 diabetes mellitus without complications: Secondary | ICD-10-CM

## 2024-02-11 LAB — BAYER DCA HB A1C WAIVED: HB A1C (BAYER DCA - WAIVED): 7.9 % — ABNORMAL HIGH (ref 4.8–5.6)

## 2024-02-11 MED ORDER — FLUOXETINE HCL 40 MG PO CAPS: 40.0000 mg | ORAL_CAPSULE | Freq: Two times a day (BID) | ORAL | 1 refills | Status: AC

## 2024-02-11 MED ORDER — OMEPRAZOLE 40 MG PO CPDR: 40.0000 mg | DELAYED_RELEASE_CAPSULE | Freq: Every day | ORAL | 1 refills | Status: AC

## 2024-02-11 MED ORDER — METFORMIN HCL 500 MG PO TABS: 500.0000 mg | ORAL_TABLET | Freq: Two times a day (BID) | ORAL | 1 refills | Status: AC

## 2024-02-11 MED ORDER — ROSUVASTATIN CALCIUM 20 MG PO TABS: 20.0000 mg | ORAL_TABLET | Freq: Every day | ORAL | 1 refills | Status: AC

## 2024-02-11 MED ORDER — OZEMPIC (0.25 OR 0.5 MG/DOSE) 2 MG/3ML ~~LOC~~ SOPN: 0.5000 mg | PEN_INJECTOR | SUBCUTANEOUS | 0 refills | Status: DC

## 2024-02-11 MED ORDER — LISINOPRIL 10 MG PO TABS: 10.0000 mg | ORAL_TABLET | Freq: Every day | ORAL | 1 refills | Status: AC

## 2024-02-11 MED ORDER — BUSPIRONE HCL 7.5 MG PO TABS: 7.5000 mg | ORAL_TABLET | Freq: Every day | ORAL | 1 refills | Status: AC

## 2024-02-11 MED ORDER — AMLODIPINE BESYLATE 10 MG PO TABS: 10.0000 mg | ORAL_TABLET | Freq: Every day | ORAL | 1 refills | Status: AC

## 2024-02-11 MED ORDER — EZETIMIBE 10 MG PO TABS: 10.0000 mg | ORAL_TABLET | Freq: Every day | ORAL | 1 refills | Status: AC

## 2024-02-11 MED ORDER — HYDROCHLOROTHIAZIDE 25 MG PO TABS: 25.0000 mg | ORAL_TABLET | Freq: Every day | ORAL | 1 refills | Status: AC

## 2024-02-11 MED ORDER — SEMAGLUTIDE (1 MG/DOSE) 4 MG/3ML ~~LOC~~ SOPN: 1.0000 mg | PEN_INJECTOR | SUBCUTANEOUS | 2 refills | Status: AC

## 2024-02-11 NOTE — Progress Notes (Signed)
 Subjective:    Patient ID: Madison Gentry, female    DOB: [birth year], 58 y.o.   MRN: [id number]   Chief Complaint: medical management of chronic issues     HPI:  Madison Gentry is a 58 y.o. who identifies as a female who was assigned female at birth.   Social history: Lives with: husband Work history: disability   Comes in today for follow up of the following chronic medical issues:  1. Primary hypertension No c/o chest pain, sob or headache. Does not check blood pressure at home. BP Readings from Last 3 Encounters:  12/24/23 119/69  11/21/23 123/70  11/12/23 110/67     2. Hyperlipidemia associated with type 2 diabetes mellitus (HCC) Does not watch diet and does no dedicated exercise. She is on zocor  and zetia . Lab Results  Component Value Date   CHOL 184 11/12/2023   HDL 81 11/12/2023   LDLCALC 86 11/12/2023   TRIG 97 11/12/2023   CHOLHDL 2.3 11/12/2023  The ASCVD Risk score (Arnett DK, et al., 2019) failed to calculate for the following reasons:   Risk score cannot be calculated because patient has a medical history suggesting prior/existing ASCVD   * - Cholesterol units were assumed    3. Diabetes mellitus treated with oral medication (HCC) She does not check her blood sugars very often. Forgot ozempic  and when she tried it again she had severe nausea. needs  to go back to lower dose. Lab Results  Component Value Date   HGBA1C 7.4 (H) 11/12/2023    4. Atherosclerosis of native coronary artery of native heart with angina pectoris (HCC) No chest pain. Has not seen cardiology  5. Mucopurulent chronic bronchitis (HCC) Is on symbicort  daily. Has a frequent cough  6. Gastroesophageal reflux disease without esophagitis Is on omeprazole  and is doing well. Has pain after eating every day  7. GAD (generalized anxiety disorder) Is on buspar  BID and is doing well.    02/11/2024    2:53 PM 11/21/2023    3:33 PM 10/23/2023    9:43 AM 05/03/2023   10:03 AM  GAD 7 :  Generalized Anxiety Score  Nervous, Anxious, on Edge 2 3 3 2   Control/stop worrying 2 1 3 2   Worry too much - different things 0 1 3 2   Trouble relaxing 2 3 3 2   Restless 0 0 0 0  Easily annoyed or irritable 3 3 3 3   Afraid - awful might happen 0 0 0 1  Total GAD 7 Score 9 11 15 12   Anxiety Difficulty Extremely difficult Extremely difficult Extremely difficult Extremely difficult     8. Recurrent major depressive disorder, in partial remission (HCC) Has been on prozac  for several years and is doing well.    02/11/2024    2:53 PM 11/21/2023    3:32 PM 10/23/2023    9:42 AM  Depression screen PHQ 2/9  Decreased Interest 2 3 3   Down, Depressed, Hopeless 0 3 2  PHQ - 2 Score 2 6 5   Altered sleeping 3 2 2   Tired, decreased energy 3 3 3   Change in appetite 3 1 0  Feeling bad or failure about yourself  0 0 0  Trouble concentrating 0 1 0  Moving slowly or fidgety/restless 0 0 0  Suicidal thoughts 0 0 0  PHQ-9 Score 11 13  10    Difficult doing work/chores Extremely dIfficult Extremely dIfficult Extremely dIfficult     Data saved with a previous flowsheet row definition  9. Primary insomnia Is on ambien  and sleeps about 7-8 hours a night.  10. Stress incontinence of urine Leaks urine and has to wear pads and depends al the time.  11. Tobacco user Still smokes over a pack a day.  12. Chronic midline low back pain without sciatica Pain assessment: Goes to pain management   13. Morbid obesity (HCC) Weight is up 5 lbs  Wt Readings from Last 3 Encounters:  02/11/24 198 lb (89.8 kg)  12/24/23 193 lb 3.2 oz (87.6 kg)  11/21/23 185 lb (83.9 kg)   BMI Readings from Last 3 Encounters:  02/11/24 36.21 kg/m  12/24/23 35.34 kg/m  11/21/23 33.84 kg/m        New complaints: None today  Allergies  Allergen Reactions   Butrans [Buprenorphine] Other (See Comments)    Skin irritation   Outpatient Encounter Medications as of 02/11/2024  Medication Sig   ACCU-CHEK  GUIDE test strip USE 1 UP TO 4 TIMES DAILY AS DIRECTED   Accu-Chek Softclix Lancets lancets Use as directed up to 4 times daily as directed Dx E11.9   albuterol  (VENTOLIN  HFA) 108 (90 Base) MCG/ACT inhaler Inhale 2 puffs into the lungs every 6 (six) hours as needed for wheezing or shortness of breath.   amLODipine  (NORVASC ) 10 MG tablet Take 1 tablet (10 mg total) by mouth daily.   Aspirin -Acetaminophen -Caffeine 520-260-32.5 MG PACK Take 1 packet by mouth 2 (two) times daily as needed. For pain or headache   baclofen  (LIORESAL ) 10 MG tablet Take 1 tablet (10 mg total) by mouth 3 (three) times daily.   Blood Glucose Monitoring Suppl (ACCU-CHEK GUIDE ME) w/Device KIT USE AS DIRECTED UP TO 4 TIMES DAILY AS DIRECTED Dx E11.9   busPIRone  (BUSPAR ) 7.5 MG tablet Take 1 tablet (7.5 mg total) by mouth daily.   clindamycin  (CLINDAGEL) 1 % gel Apply topically 2 (two) times daily.   cyclobenzaprine  (FLEXERIL ) 10 MG tablet Take 1 tablet (10 mg total) by mouth 3 (three) times daily.   diclofenac  Sodium (VOLTAREN ) 1 % GEL Apply 2 g topically 4 (four) times daily as needed (pain).   ezetimibe  (ZETIA ) 10 MG tablet Take 1 tablet (10 mg total) by mouth daily. TAKE 1 TABLET BY MOUTH ONCE DAILY   FLUoxetine  (PROZAC ) 40 MG capsule Take 1 capsule (40 mg total) by mouth 2 (two) times daily.   hydrochlorothiazide  (HYDRODIURIL ) 25 MG tablet Take 1 tablet (25 mg total) by mouth daily.   HYDROcodone -acetaminophen  (NORCO) 10-325 MG tablet Take 1 tablet by mouth every 6 (six) hours.   ibuprofen  (ADVIL ,MOTRIN ) 200 MG tablet Take 200 mg by mouth every 6 (six) hours as needed. Takes 800 mg when needed for menstrual cramping   Incontinence Supplies KIT Patient needs Pull ups, chuck pads and wipes.   Iron , Ferrous Sulfate , 325 (65 Fe) MG TABS Take 325 mg by mouth daily.   ketoconazole  (NIZORAL ) 2 % shampoo Apply 1 Application topically 2 (two) times a week.   lidocaine  (LIDODERM ) 5 % Place 1 patch onto the skin daily. Remove &  Discard patch within 12 hours or as directed by MD   lisinopril  (ZESTRIL ) 10 MG tablet Take 1 tablet (10 mg total) by mouth daily.   metFORMIN  (GLUCOPHAGE ) 500 MG tablet Take 1 tablet (500 mg total) by mouth 2 (two) times daily with a meal.   methylPREDNISolone  (MEDROL  DOSEPAK) 4 MG TBPK tablet Follow instructions on the box   naproxen (NAPROSYN) 500 MG tablet Take 500 mg by mouth 2 (two) times  daily as needed.   nitroGLYCERIN  (NITROSTAT ) 0.4 MG SL tablet DISSOLVE ONE TABLET UNDER THE TONGUE EVERY 5 MINUTES AS NEEDED FOR CHEST PAIN.  DO NOT EXCEED A TOTAL OF 3 DOSES IN 15 MINUTES (Patient taking differently: No sig reported)   omeprazole  (PRILOSEC) 40 MG capsule Take 1 capsule (40 mg total) by mouth daily.   rosuvastatin  (CRESTOR ) 20 MG tablet Take 1 tablet (20 mg total) by mouth daily.   Semaglutide ,0.25 or 0.5MG /DOS, (OZEMPIC , 0.25 OR 0.5 MG/DOSE,) 2 MG/3ML SOPN Inject 0.5 mg into the skin once a week.   silver sulfADIAZINE (SILVADENE) 1 % cream Apply 1 application  topically 2 (two) times daily as needed (rash/skin irritation).   simvastatin  (ZOCOR ) 40 MG tablet Take 40 mg by mouth daily at 6 PM.   zolpidem  (AMBIEN ) 10 MG tablet Take 1 tablet (10 mg total) by mouth at bedtime.   No facility-administered encounter medications on file as of 02/11/2024.    Past Surgical History:  Procedure Laterality Date   TUBAL LIGATION      Family History  Problem Relation Age of Onset   CAD Mother 66   CAD Father 28       Died age 61   CAD Brother 15      Controlled substance contract: n/a     Review of Systems  Constitutional:  Negative for diaphoresis.  Eyes:  Negative for pain.  Respiratory:  Negative for shortness of breath.   Cardiovascular:  Negative for chest pain, palpitations and leg swelling.  Gastrointestinal:  Negative for abdominal pain.  Endocrine: Negative for polydipsia.  Skin:  Negative for rash.  Neurological:  Negative for dizziness, weakness and headaches.   Hematological:  Does not bruise/bleed easily.  All other systems reviewed and are negative.      Objective:   Physical Exam Vitals and nursing note reviewed.  Constitutional:      General: She is not in acute distress.    Appearance: Normal appearance. She is well-developed.  HENT:     Head: Normocephalic.     Right Ear: Tympanic membrane normal.     Left Ear: Tympanic membrane normal.     Nose: Nose normal.     Mouth/Throat:     Mouth: Mucous membranes are moist.  Eyes:     Pupils: Pupils are equal, round, and reactive to light.  Neck:     Vascular: No carotid bruit or JVD.  Cardiovascular:     Rate and Rhythm: Normal rate and regular rhythm.     Heart sounds: Normal heart sounds.  Pulmonary:     Effort: Pulmonary effort is normal. No respiratory distress.     Breath sounds: Normal breath sounds. No wheezing or rales.  Chest:     Chest wall: No tenderness.  Abdominal:     General: Bowel sounds are normal. There is no distension or abdominal bruit.     Palpations: Abdomen is soft. There is no hepatomegaly, splenomegaly, mass or pulsatile mass.     Tenderness: There is no abdominal tenderness.  Musculoskeletal:        General: Normal range of motion.     Cervical back: Normal range of motion and neck supple.  Lymphadenopathy:     Cervical: No cervical adenopathy.  Skin:    General: Skin is warm and dry.  Neurological:     Mental Status: She is alert and oriented to person, place, and time.     Deep Tendon Reflexes: Reflexes are normal and symmetric.  Psychiatric:        Behavior: Behavior normal.        Thought Content: Thought content normal.        Judgment: Judgment normal.    BP 102/76   Pulse 88   Temp (!) 97.5 F (36.4 C) (Temporal)   Ht 5' 2 (1.575 m)   Wt 198 lb (89.8 kg)   SpO2 94%   BMI 36.21 kg/m     Hgba1c 7.9%      Assessment & Plan:   Vanette Noguchi comes in today with chief complaint of medical management of chronic issues     Diagnosis and orders addressed:  1. Primary hypertension Low sodium diet - CBC with Differential/Platelet - CMP14+EGFR - amLODipine  (NORVASC ) 10 MG tablet; Take 1 tablet (10 mg total) by mouth daily.  Dispense: 90 tablet; Refill: 1 - hydrochlorothiazide  (HYDRODIURIL ) 25 MG tablet; Take 1 tablet (25 mg total) by mouth daily.  Dispense: 90 tablet; Refill: 1 - lisinopril  (ZESTRIL ) 10 MG tablet; Take 1 tablet (10 mg total) by mouth daily.  Dispense: 90 tablet; Refill: 1  2. Hyperlipidemia associated with type 2 diabetes mellitus (HCC) Low fat diet Change zocor  to crestor  20mg  daily - Lipid panel - ezetimibe  (ZETIA ) 10 MG tablet; Take 1 tablet (10 mg total) by mouth daily. TAKE 1 TABLET BY MOUTH ONCE DAILY  Dispense: 90 tablet; Refill: 1 - crestor  20 MG tablet; TAKE ONE TABLET BY MOUTH ONCE DAILY AT 6PM.  Dispense: 90 tablet; Refill: 1  3. Diabetes mellitus treated with oral medication (HCC) Continue  to watch carbs Increased ozempic  1mg  weekly Strict low carb counting - Bayer DCA Hb A1c Waived - Microalbumin / creatinine urine ratio - metFORMIN  (GLUCOPHAGE ) 500 MG tablet; Take 1 tablet (500 mg total) by mouth 2 (two) times daily with a meal.  Dispense: 180 tablet; Refill: 1 - Semaglutide , 0.5 MG/DOSE, (OZEMPIC , 0.5 MG/DOSE,) 8 MG/3ML SOPN; Inject 2 mg into the muscle once a week.  Dispense: 9 mL; Refill: 1  4. Atherosclerosis of native coronary artery of native heart with angina pectoris (HCC)  5. Mucopurulent chronic bronchitis (HCC) Continue inhalers  6. Gastroesophageal reflux disease without esophagitis Avoid spicy foods Do not eat 2 hours prior to bedtime  - omeprazole  (PRILOSEC) 40 MG capsule; Take 1 capsule (40 mg total) by mouth daily.  Dispense: 90 capsule; Refill: 1  7. GAD (generalized anxiety disorder) Stress management - busPIRone  (BUSPAR ) 7.5 MG tablet; Take 1 tablet (7.5 mg total) by mouth daily.  Dispense: 90 tablet; Refill: 2  8. Recurrent major depressive  disorder, in partial remission (HCC) - FLUoxetine  (PROZAC ) 40 MG capsule; Take 1 capsule (40 mg total) by mouth 2 (two) times daily.  Dispense: 180 capsule; Refill: 1  9. Primary insomnia Bedtime routine - zolpidem  (AMBIEN ) 10 MG tablet; Take 1 tablet (10 mg total) by mouth at bedtime.  Dispense: 30 tablet; Refill: 5  10. Stress incontinence of urine  11. Tobacco user Smoking cessation encouraged  12. Chronic midline low back pain without sciatica Keep follow up with pain management  13. Morbid obesity (HCC) Discussed diet and exercise for person with BMI >25 Will recheck weight in 3-6 months  14. RUQ pain U/s gallbaldder ordered Avoid spicy and fatty foods  Labs pending Health Maintenance reviewed Diet and exercise encouraged  Follow up plan: 6 months   Mary-Margaret Gladis, FNP

## 2024-02-11 NOTE — Patient Instructions (Signed)
 Gallbladder Problems: Eating Plan Having high blood cholesterol, obesity, an inactive lifestyle, an unhealthy diet, or diabetes can put you at risk for getting gallstones. If you have a gallbladder problem, you may have trouble digesting fats. You may also have symptoms when you eat a lot of fat. Eating a low-fat diet can help lessen your symptoms. It may be helpful before and after having surgery to have your gallbladder taken out. Your health care provider may recommend that you work with an expert in healthy eating called a dietitian. They can help you lower the amount of fat in your diet. What are tips for following this plan? General guidelines Limit how much fat you have to less than 30% of your total daily calories. If you eat around 1,800 calories each day, you'll be eating less than 60 grams (g) of fat a day. Fat is an important part of a healthy diet. Eating a low-fat diet can make it hard to keep a healthy body weight. Ask your dietitian how much fat, calories, and other nutrients you need each day. Eat small meals often throughout the day instead of 3 large meals. Drink at least 8-10 cups (1.9-2.4 L) of fluid a day. If you drink alcohol: Limit how much you have to: 0-1 drink a day if you're female. 0-2 drinks a day if you're female. Know how much alcohol is in your drink. In the [place] one drink is one 12 oz bottle of beer (355 mL), one 5 oz glass of wine (148 mL), or one 1 oz glass of hard liquor (44 mL). Reading food labels  Check nutrition facts on food labels for how much fat is in a serving. Choose foods with less than 3 grams of fat per serving. Shopping Choose nonfat and low-fat healthy foods. Look for the words nonfat, low-fat, or fat-free. Avoid buying processed or prepackaged foods. Cooking Cook using low-fat methods, such as baking, broiling, grilling, or boiling. Cook with small amounts of healthy fats, such as olive oil, grapeseed oil, canola oil, avocado oil, or  sunflower oil. What foods are recommended?  All fresh, frozen, or canned fruits and vegetables. Whole grains. Low-fat or nonfat (skim) milk and yogurt. Lean meat, skinless poultry, fish, eggs, and beans. Low-fat protein supplement powders or drinks. Spices and herbs. The items listed above may not be all the foods and drinks you can have. Talk with a dietitian to learn more. What foods are not recommended? High-fat foods. These include baked goods, fast food, fatty cuts of meat, ice cream, french toast, sweet rolls, pizza, cheese bread, foods covered with butter, creamy sauces, or cheese. Fried foods. These include french fries, tempura, battered fish, breaded chicken, fried breads, and sweets. Foods that cause bloating and gas. The items listed above may not be all the foods and drinks you should avoid. Talk with a dietitian to learn more. This information is not intended to replace advice given to you by your health care provider. Make sure you discuss any questions you have with your health care provider. Document Revised: 05/21/2023 Document Reviewed: 05/21/2023 Elsevier Patient Education  2025 [employer].

## 2024-02-12 ENCOUNTER — Ambulatory Visit: Payer: Self-pay | Admitting: Nurse Practitioner

## 2024-02-12 LAB — CMP14+EGFR
ALT: 17 IU/L (ref 0–32)
AST: 19 IU/L (ref 0–40)
Albumin: 3.8 g/dL (ref 3.8–4.9)
Alkaline Phosphatase: 67 IU/L (ref 49–135)
BUN/Creatinine Ratio: 17 (ref 9–23)
BUN: 10 mg/dL (ref 6–24)
Bilirubin Total: <0.2 mg/dL (ref 0.0–1.2)
CO2: 26 mmol/L (ref 20–29)
Calcium: 9.5 mg/dL (ref 8.7–10.2)
Chloride: 97 mmol/L (ref 96–106)
Creatinine, Ser: 0.59 mg/dL (ref 0.57–1.00)
Globulin, Total: 2.1 g/dL (ref 1.5–4.5)
Glucose: 138 mg/dL — ABNORMAL HIGH (ref 70–99)
Potassium: 4.2 mmol/L (ref 3.5–5.2)
Sodium: 138 mmol/L (ref 134–144)
Total Protein: 5.9 g/dL — ABNORMAL LOW (ref 6.0–8.5)
eGFR: 106 mL/min/1.73 (ref >59)

## 2024-02-12 LAB — CBC WITH DIFFERENTIAL/PLATELET
Basophils Absolute: 0.1 x10E3/uL (ref 0.0–0.2)
Basos: 1 %
EOS (ABSOLUTE): 0.3 x10E3/uL (ref 0.0–0.4)
Eos: 4 %
Hemoglobin: 12.8 g/dL (ref 11.1–15.9)
Immature Grans (Abs): 0 x10E3/uL (ref 0.0–0.1)
Immature Granulocytes: 0 %
Lymphocytes Absolute: 3.8 x10E3/uL — ABNORMAL HIGH (ref 0.7–3.1)
Lymphs: 38 %
MCH: 28.7 pg (ref 26.6–33.0)
MCHC: 32 g/dL (ref 31.5–35.7)
MCV: 90 fL (ref 79–97)
Monocytes Absolute: 0.8 x10E3/uL (ref 0.1–0.9)
Monocytes: 8 %
Neutrophils Absolute: 4.8 x10E3/uL (ref 1.4–7.0)
Neutrophils: 49 %
Platelets: 491 x10E3/uL — ABNORMAL HIGH (ref 150–450)
RBC: 4.46 x10E6/uL (ref 3.77–5.28)
RDW: 14.8 % (ref 11.7–15.4)
WBC: 9.8 x10E3/uL (ref 3.4–10.8)

## 2024-02-12 LAB — ANEMIA PANEL
Ferritin: 26 ng/mL (ref 15–150)
Folate, Hemolysate: 343 ng/mL
Folate, RBC: 858 ng/mL (ref >498)
Hematocrit: 40 % (ref 34.0–46.6)
Iron Saturation: 10 % — ABNORMAL LOW (ref 15–55)
Iron: 37 ug/dL (ref 27–159)
Retic Ct Pct: 1.7 % (ref 0.6–2.6)
Total Iron Binding Capacity: 363 ug/dL (ref 250–450)
UIBC: 326 ug/dL (ref 131–425)
Vitamin B-12: 595 pg/mL (ref 232–1245)

## 2024-02-12 LAB — LIPID PANEL
Chol/HDL Ratio: 2.4 ratio (ref 0.0–4.4)
Cholesterol, Total: 106 mg/dL (ref 100–199)
HDL: 45 mg/dL (ref >39)
LDL Chol Calc (NIH): 34 mg/dL (ref 0–99)
Triglycerides: 163 mg/dL — ABNORMAL HIGH (ref 0–149)
VLDL Cholesterol Cal: 27 mg/dL (ref 5–40)

## 2024-02-12 LAB — MICROALBUMIN / CREATININE URINE RATIO
Creatinine, Urine: 40.9 mg/dL
Microalb/Creat Ratio: <7 mg/g{creat} (ref 0–29)
Microalbumin, Urine: <3 ug/mL

## 2024-02-24 ENCOUNTER — Ambulatory Visit (HOSPITAL_COMMUNITY): Attending: Nurse Practitioner

## 2024-03-10 ENCOUNTER — Other Ambulatory Visit (HOSPITAL_COMMUNITY): Payer: Self-pay

## 2024-03-11 ENCOUNTER — Telehealth: Payer: Self-pay

## 2024-03-11 ENCOUNTER — Other Ambulatory Visit (HOSPITAL_COMMUNITY): Payer: Self-pay

## 2024-03-11 NOTE — Telephone Encounter (Signed)
 Pharmacy Patient Advocate Encounter   Received notification from [facility] that prior authorization for ZOLPIDEM  10MG  is required/requested.   Insurance verification completed.   The patient is insured through [facility] MEDICAID.   PA required; PA started via CoverMyMeds. KEY [facility] . Waiting for clinical questions to populate.

## 2024-03-12 NOTE — Telephone Encounter (Signed)
 Pharmacy Patient Advocate Encounter   PA required; PA submitted to above mentioned insurance via Latent Key/confirmation #/EOC [id number]. Status is pending

## 2024-03-16 NOTE — Telephone Encounter (Signed)
 Pharmacy Patient Advocate Encounter  Received notification from [facility] MEDICAID that Prior Authorization for ZOLPIDEM  has been DENIED.  Full denial letter will be uploaded to the media tab. See denial reason below.    Dx. Primary insomnia   Previously filled & covered for 15 tabs for 30 days.  PA #/Case ID/Reference #: [id number]

## 2024-03-23 ENCOUNTER — Other Ambulatory Visit (HOSPITAL_COMMUNITY): Payer: Self-pay

## 2024-04-30 ENCOUNTER — Ambulatory Visit: Admitting: Nurse Practitioner
